# Patient Record
Sex: Male | Born: 1935 | Race: White | Hispanic: No | Marital: Married | Smoking: Former smoker
Health system: Southern US, Community
[De-identification: ages and names within clinical notes are randomized; demographics above are authoritative.]

## PROBLEM LIST (undated history)

## (undated) DIAGNOSIS — J449 Chronic obstructive pulmonary disease, unspecified: Secondary | ICD-10-CM

## (undated) DIAGNOSIS — I6521 Occlusion and stenosis of right carotid artery: Secondary | ICD-10-CM

## (undated) DIAGNOSIS — K2970 Gastritis, unspecified, without bleeding: Secondary | ICD-10-CM

## (undated) DIAGNOSIS — C801 Malignant (primary) neoplasm, unspecified: Secondary | ICD-10-CM

## (undated) DIAGNOSIS — K449 Diaphragmatic hernia without obstruction or gangrene: Secondary | ICD-10-CM

## (undated) DIAGNOSIS — K56609 Unspecified intestinal obstruction, unspecified as to partial versus complete obstruction: Secondary | ICD-10-CM

## (undated) DIAGNOSIS — E039 Hypothyroidism, unspecified: Secondary | ICD-10-CM

## (undated) DIAGNOSIS — I714 Abdominal aortic aneurysm, without rupture, unspecified: Secondary | ICD-10-CM

## (undated) DIAGNOSIS — I2510 Atherosclerotic heart disease of native coronary artery without angina pectoris: Secondary | ICD-10-CM

## (undated) DIAGNOSIS — R634 Abnormal weight loss: Secondary | ICD-10-CM

## (undated) HISTORY — PX: HERNIA REPAIR: SHX51

## (undated) HISTORY — DX: Malignant (primary) neoplasm, unspecified: C80.1

## (undated) HISTORY — PX: ABDOMINAL AORTIC ANEURYSM REPAIR: SUR1152

## (undated) HISTORY — DX: Gastritis, unspecified, without bleeding: K29.70

## (undated) HISTORY — DX: Diaphragmatic hernia without obstruction or gangrene: K44.9

## (undated) HISTORY — DX: Abnormal weight loss: R63.4

## (undated) HISTORY — PX: CHOLECYSTECTOMY: SHX55

## (undated) HISTORY — DX: Unspecified intestinal obstruction, unspecified as to partial versus complete obstruction: K56.609

## (undated) HISTORY — DX: Chronic obstructive pulmonary disease, unspecified: J44.9

## (undated) HISTORY — DX: Hypothyroidism, unspecified: E03.9

## (undated) HISTORY — DX: Atherosclerotic heart disease of native coronary artery without angina pectoris: I25.10

## (undated) HISTORY — PX: BACK SURGERY: SHX140

## (undated) HISTORY — DX: Abdominal aortic aneurysm, without rupture, unspecified: I71.40

## (undated) HISTORY — PX: OTHER SURGICAL HISTORY: SHX169

## (undated) HISTORY — DX: Abdominal aortic aneurysm, without rupture: I71.4

## (undated) HISTORY — DX: Occlusion and stenosis of right carotid artery: I65.21

---

## 2004-07-12 ENCOUNTER — Emergency Department (HOSPITAL_COMMUNITY): Admission: EM | Admit: 2004-07-12 | Discharge: 2004-07-12 | Payer: Self-pay | Admitting: Emergency Medicine

## 2005-01-29 ENCOUNTER — Emergency Department (HOSPITAL_COMMUNITY): Admission: EM | Admit: 2005-01-29 | Discharge: 2005-01-29 | Payer: Self-pay | Admitting: Emergency Medicine

## 2005-02-17 ENCOUNTER — Ambulatory Visit: Payer: Self-pay | Admitting: Internal Medicine

## 2005-02-19 ENCOUNTER — Ambulatory Visit: Payer: Self-pay | Admitting: Internal Medicine

## 2005-02-19 ENCOUNTER — Ambulatory Visit (HOSPITAL_COMMUNITY): Admission: RE | Admit: 2005-02-19 | Discharge: 2005-02-19 | Payer: Self-pay | Admitting: Internal Medicine

## 2005-02-19 HISTORY — PX: UPPER GASTROINTESTINAL ENDOSCOPY: SHX188

## 2005-03-19 ENCOUNTER — Ambulatory Visit: Payer: Self-pay | Admitting: Internal Medicine

## 2006-01-13 ENCOUNTER — Ambulatory Visit (HOSPITAL_COMMUNITY): Admission: RE | Admit: 2006-01-13 | Discharge: 2006-01-13 | Payer: Self-pay | Admitting: Orthopaedic Surgery

## 2006-01-13 ENCOUNTER — Encounter: Payer: Self-pay | Admitting: Orthopedic Surgery

## 2006-03-30 ENCOUNTER — Ambulatory Visit: Payer: Self-pay | Admitting: Internal Medicine

## 2006-03-31 ENCOUNTER — Ambulatory Visit (HOSPITAL_COMMUNITY): Admission: RE | Admit: 2006-03-31 | Discharge: 2006-03-31 | Payer: Self-pay | Admitting: Internal Medicine

## 2006-04-29 ENCOUNTER — Encounter: Admission: RE | Admit: 2006-04-29 | Discharge: 2006-04-29 | Payer: Self-pay | Admitting: Vascular Surgery

## 2006-05-03 HISTORY — PX: CARDIOVASCULAR STRESS TEST: SHX262

## 2006-06-01 ENCOUNTER — Inpatient Hospital Stay (HOSPITAL_COMMUNITY): Admission: RE | Admit: 2006-06-01 | Discharge: 2006-06-16 | Payer: Self-pay | Admitting: Vascular Surgery

## 2006-06-01 ENCOUNTER — Encounter (INDEPENDENT_AMBULATORY_CARE_PROVIDER_SITE_OTHER): Payer: Self-pay | Admitting: *Deleted

## 2006-07-12 ENCOUNTER — Inpatient Hospital Stay (HOSPITAL_COMMUNITY): Admission: EM | Admit: 2006-07-12 | Discharge: 2006-07-19 | Payer: Self-pay | Admitting: Emergency Medicine

## 2006-07-13 ENCOUNTER — Encounter (INDEPENDENT_AMBULATORY_CARE_PROVIDER_SITE_OTHER): Payer: Self-pay | Admitting: *Deleted

## 2006-07-13 HISTORY — PX: UPPER GASTROINTESTINAL ENDOSCOPY: SHX188

## 2006-09-19 ENCOUNTER — Ambulatory Visit (HOSPITAL_COMMUNITY): Admission: RE | Admit: 2006-09-19 | Discharge: 2006-09-19 | Payer: Self-pay | Admitting: Family Medicine

## 2006-10-28 ENCOUNTER — Ambulatory Visit: Payer: Self-pay | Admitting: Vascular Surgery

## 2007-06-07 ENCOUNTER — Inpatient Hospital Stay (HOSPITAL_COMMUNITY): Admission: EM | Admit: 2007-06-07 | Discharge: 2007-06-09 | Payer: Self-pay | Admitting: Emergency Medicine

## 2007-06-08 HISTORY — PX: CARDIAC CATHETERIZATION: SHX172

## 2007-08-16 ENCOUNTER — Ambulatory Visit: Payer: Self-pay | Admitting: Vascular Surgery

## 2007-09-24 ENCOUNTER — Inpatient Hospital Stay (HOSPITAL_COMMUNITY): Admission: EM | Admit: 2007-09-24 | Discharge: 2007-09-25 | Payer: Self-pay | Admitting: Emergency Medicine

## 2007-10-22 ENCOUNTER — Inpatient Hospital Stay (HOSPITAL_COMMUNITY): Admission: EM | Admit: 2007-10-22 | Discharge: 2007-11-06 | Payer: Self-pay | Admitting: Emergency Medicine

## 2007-10-22 ENCOUNTER — Encounter: Payer: Self-pay | Admitting: Emergency Medicine

## 2007-11-21 ENCOUNTER — Encounter: Admission: RE | Admit: 2007-11-21 | Discharge: 2007-11-21 | Payer: Self-pay | Admitting: General Surgery

## 2009-01-30 ENCOUNTER — Ambulatory Visit: Payer: Self-pay | Admitting: Orthopedic Surgery

## 2009-01-30 DIAGNOSIS — M25569 Pain in unspecified knee: Secondary | ICD-10-CM | POA: Insufficient documentation

## 2009-01-30 DIAGNOSIS — M23329 Other meniscus derangements, posterior horn of medial meniscus, unspecified knee: Secondary | ICD-10-CM

## 2009-01-30 DIAGNOSIS — S83259A Bucket-handle tear of lateral meniscus, current injury, unspecified knee, initial encounter: Secondary | ICD-10-CM | POA: Insufficient documentation

## 2009-01-30 DIAGNOSIS — M1710 Unilateral primary osteoarthritis, unspecified knee: Secondary | ICD-10-CM

## 2009-01-30 DIAGNOSIS — M23302 Other meniscus derangements, unspecified lateral meniscus, unspecified knee: Secondary | ICD-10-CM

## 2009-01-31 ENCOUNTER — Encounter: Payer: Self-pay | Admitting: Orthopedic Surgery

## 2009-02-03 ENCOUNTER — Ambulatory Visit: Payer: Self-pay | Admitting: Orthopedic Surgery

## 2009-02-03 ENCOUNTER — Ambulatory Visit (HOSPITAL_COMMUNITY): Admission: RE | Admit: 2009-02-03 | Discharge: 2009-02-03 | Payer: Self-pay | Admitting: Orthopedic Surgery

## 2009-02-05 ENCOUNTER — Ambulatory Visit: Payer: Self-pay | Admitting: Orthopedic Surgery

## 2009-02-05 DIAGNOSIS — M1710 Unilateral primary osteoarthritis, unspecified knee: Secondary | ICD-10-CM

## 2009-02-06 ENCOUNTER — Encounter (HOSPITAL_COMMUNITY): Admission: RE | Admit: 2009-02-06 | Discharge: 2009-03-08 | Payer: Self-pay | Admitting: Orthopedic Surgery

## 2009-02-06 ENCOUNTER — Encounter: Payer: Self-pay | Admitting: Orthopedic Surgery

## 2009-02-19 ENCOUNTER — Encounter: Payer: Self-pay | Admitting: Orthopedic Surgery

## 2009-02-26 ENCOUNTER — Ambulatory Visit: Payer: Self-pay | Admitting: Orthopedic Surgery

## 2009-04-10 ENCOUNTER — Ambulatory Visit: Payer: Self-pay | Admitting: Orthopedic Surgery

## 2009-04-10 DIAGNOSIS — G5760 Lesion of plantar nerve, unspecified lower limb: Secondary | ICD-10-CM | POA: Insufficient documentation

## 2009-04-10 DIAGNOSIS — M25469 Effusion, unspecified knee: Secondary | ICD-10-CM | POA: Insufficient documentation

## 2009-04-24 ENCOUNTER — Ambulatory Visit: Payer: Self-pay | Admitting: Orthopedic Surgery

## 2009-04-29 ENCOUNTER — Ambulatory Visit: Payer: Self-pay | Admitting: Orthopedic Surgery

## 2009-05-07 ENCOUNTER — Ambulatory Visit: Payer: Self-pay | Admitting: Orthopedic Surgery

## 2009-05-21 ENCOUNTER — Ambulatory Visit: Payer: Self-pay | Admitting: Orthopedic Surgery

## 2009-06-04 ENCOUNTER — Ambulatory Visit: Payer: Self-pay | Admitting: Orthopedic Surgery

## 2009-08-12 ENCOUNTER — Ambulatory Visit: Payer: Self-pay | Admitting: Orthopedic Surgery

## 2009-12-24 ENCOUNTER — Ambulatory Visit: Payer: Self-pay | Admitting: Orthopedic Surgery

## 2010-07-08 ENCOUNTER — Ambulatory Visit: Payer: Self-pay | Admitting: Internal Medicine

## 2010-07-22 ENCOUNTER — Ambulatory Visit: Payer: Self-pay | Admitting: Internal Medicine

## 2010-07-22 ENCOUNTER — Ambulatory Visit (HOSPITAL_COMMUNITY): Admission: RE | Admit: 2010-07-22 | Discharge: 2010-07-22 | Payer: Self-pay | Admitting: Internal Medicine

## 2010-07-22 HISTORY — PX: COLONOSCOPY: SHX174

## 2010-07-22 HISTORY — PX: UPPER GASTROINTESTINAL ENDOSCOPY: SHX188

## 2010-08-12 ENCOUNTER — Ambulatory Visit: Payer: Self-pay | Admitting: Orthopedic Surgery

## 2010-09-29 NOTE — Assessment & Plan Note (Signed)
Summary: REQ DRAW FLUID LEFT KNEE/MEDICARE/BSF   Visit Type:  Follow-up Referring Provider:  self  CC:  left knee pain.  History of Present Illness: 77 years old male status post arthroscopy and partial lateral meniscectomy LEFT knee June of 2010 DOS 02-03-09. Procedure arthroscopy, partial and lateral menisectomies.  Medications: Tylenol and Vicodin.  His postoperative course has been marked by recurrent effusions and has required recurrent aspirations and injections and he had a lot of swelling last week and wanted to have his knee aspirated again.  However today the swelling is gone down he is just having some lateral knee pain.  Review of systems no mechanical symptoms    Allergies: 1)  ! Penicillin 2)  ! Niacin 3)  ! * Nexium 4)  ! Aspirin (Aspirin)  Past History:  Past Medical History: Last updated: 01/30/2009 copd thyroid reflux htn  Past Surgical History: Last updated: 01/30/2009 hernia repair x2 in the abdomen back ulcer gallbladder bowel obstruction aneurysm repair abdominal  Social History: Last updated: 01/30/2009 Patient is married.  yard work  Physical Exam  Additional Exam:  Is a very thin male who appears to be in good shape.  He is oriented x3.  His mood and affect are normal.  His gait and station are normal.  His LEFT knee has a mild amount of tenderness laterally there is no effusion.  His RIGHT knee is without joint effusion.  He has full range of motion in the LEFT knee.  His knee is stable.  His strength is normal.  His skin is intact.  He has an excellent pulse and normal sensation.     Impression & Recommendations:  Problem # 1:  KNEE PAIN (ZOX-096.04) Assessment Deteriorated  Verbal consent was obtained. The knee was prepped with alcohol and ethyl chloride. 1 cc of depomedrol 40mg /cc and 4 cc of lidocaine 1% was injected. there were no complications. Her LEFT knee was injected no aspiration  Orders: Joint Aspirate /  Injection, Large (20610)  Problem # 2:  OSTEOARTHRITIS, LOWER LEG (ICD-715.16)  Orders: Est. Patient Level III (54098) Depo- Medrol 40mg  (J1030) Joint Aspirate / Injection, Large (20610)  Patient Instructions: 1)  You have received an injection of cortisone today. You may experience increased pain at the injection site. Apply ice pack to the area for 20 minutes every 2 hours and take 2 xtra strength tylenol every 8 hours. This increased pain will usually resolve in 24 hours. The injection will take effect in 3-10 days.  2)  Please schedule a follow-up appointment as needed.

## 2010-10-01 NOTE — Assessment & Plan Note (Signed)
Summary: bi knee pain may need new xr/mcr/bsf   Visit Type:  Follow-up Referring Provider:  self  CC:  knee pain.  History of Present Illness: I saw Luis Guerra in the office today for a followup visit.  He is a 77 years old man with the complaint of:  knee pain  Last injection 4/11.  He states that his knees have been bothering him for 2 months.  Medication: Tylenol.  He really complains of bilateral knee stiffness, mild pain.  He has excellent range of motion of both knees. No joint effusions. Medial tenderness along the joint line  Bilateral knee injections Verbal consent was obtained. The RIGHT knee was prepped with alcohol and ethyl chloride. 1 cc of depomedrol 40mg /cc and 4 cc of lidocaine 1% was injected. there were no complications.  Verbal consent was obtained. The knee was prepped with alcohol and ethyl chloride. 1 cc of depomedrol 40mg /cc and 4 cc of lidocaine 1% was injected. there were no complications.   Allergies: 1)  ! Penicillin 2)  ! Niacin 3)  ! * Nexium 4)  ! Aspirin (Aspirin)   Impression & Recommendations:  Problem # 1:  DEGENERATIVE JOINT DISEASE, RIGHT KNEE (ICD-715.96) Assessment Deteriorated  Orders: Joint Aspirate / Injection, Large (20610) Depo- Medrol 40mg  (J1030)  Problem # 2:  OSTEOARTHRITIS, KNEE, LEFT (ICD-715.96) Assessment: Deteriorated  Orders: Joint Aspirate / Injection, Large (20610) Depo- Medrol 40mg  (J1030)  Patient Instructions: 1)  You have received an injection of cortisone today. You may experience increased pain at the injection site. Apply ice pack to the area for 20 minutes every 2 hours and take 2 xtra strength tylenol every 8 hours. This increased pain will usually resolve in 24 hours. The injection will take effect in 3-10 days.  2)  Please schedule a follow-up appointment as needed.   Orders Added: 1)  Joint Aspirate / Injection, Large [20610] 2)  Depo- Medrol 40mg  [J1030]

## 2010-11-10 LAB — KOH PREP

## 2010-12-07 LAB — BASIC METABOLIC PANEL
Calcium: 9 mg/dL (ref 8.4–10.5)
GFR calc Af Amer: >60 mL/min (ref >60)
GFR calc non Af Amer: 56 mL/min — ABNORMAL LOW (ref >60)
Sodium: 140 mEq/L (ref 135–145)

## 2010-12-07 LAB — HEMOGLOBIN AND HEMATOCRIT, BLOOD: HCT: 39 % (ref 39.0–52.0)

## 2011-01-12 NOTE — H&P (Signed)
NAME:  Luis Guerra, Luis Guerra NO.:  [id number]   MEDICAL RECORD NO.:  [id number]          PATIENT TYPE:  INP   LOCATION:  5121                         FACILITY:  [facility]   PHYSICIAN:  Adolph Pollack, M.D.DATE OF BIRTH:  03-Nov-1920   DATE OF ADMISSION:  10/22/2007  DATE OF DISCHARGE:                              HISTORY & PHYSICAL   REASON FOR ADMISSION:  Partial small bowel obstruction.   HISTORY OF PRESENT ILLNESS:  This is a 77 year old male who has been  having intermittent episodes of abdominal bloating and nausea and  vomiting.  No pain with this.  He does have bowel movements, had a bowel  movement yesterday.  However, he had bilious vomiting today and  presented to [facility] emergency department.  He was evaluated there,  noted to be severely dehydrated with a BUN in the 70s and a creatinine  of 4.75.  He had a CT scan which suggested a possible proximal small  bowel obstruction.  They called the general surgeon there, but since he  had had previous surgeries here, it was recommended he be transferred to  [facility], and he was brought down here for evaluation.  He states that  his problems with this started after he had an abdominal aortic aneurysm  repair and a ventral hernia with mesh in October of 2007.  The episodes  are becoming more frequent.  He was last admitted to [facility]  for an episode like this and severe dehydration January 24 of this year.   PAST MEDICAL HISTORY:  1. Hypertension.  2. Abdominal aortic aneurysm.  3. Peptic ulcer disease.  4. COPD.  5. Hypothyroidism.  6. Coronary artery disease.  7. Gastroesophageal flux disease.  8. Severe dehydration, acute renal insufficiency.   PREVIOUS OPERATIONS:  1. Partial gastrectomy.  2. Abdominal aneurysm repair.  3. Ventral hernia with mesh.   ALLERGIES:  NIASPAN AND PENICILLIN.   MEDICATIONS:  L-thyroxine, Protonix, Advair discus, theophylline.   SOCIAL HISTORY:  He  is a  former smoker.  He ate raisins that have been  soaked in gin every day.  He is married, and his wife is here with him.   REVIEW OF SYSTEMS:  GENERAL:  Notable for the weight loss as above.  CARDIOVASCULAR:  He has apparently had an episode and stable angina in  the past but has been treated medically.  Denies chest pain now.  PULMONARY:  Has the skin COPD as above.  GI:  He denies any hepatitis, diverticulosis, blood in stool.  GU:  He does have nocturia and BPH-type symptoms but does not start to  strain.  ENDOCRINE:  No diabetes or hypercholesterolemia, per his report.  NEUROLOGIC:  No strokes or seizures.  HEMATOLOGIC:  No known bleeding disorders, blood clots, or transfusions.   PHYSICAL EXAM:  GENERAL:  A thin elderly male, no acute distress.  VITAL SIGNS:  Temperature is 96.7, blood pressure is 165/98, pulse 95.  HEENT:  Notable for bilateral hearing aids and an NGN.  Tongue is  slightly moist.  NECK:  Supple, no obvious masses, and  muscular wasting is noted.  RESPIRATORY:  The breath sounds are distant but equal and clear.  CARDIOVASCULAR:  Regular rate, regular rhythm.  ABDOMEN:  Soft, nontender.  There is a midline incision with very  irregular foreign body palpable, consistent with mesh.  Few bowel sounds  are noted.  No high-pitched bowel sounds.  No significant distention or  tympany.  RECTAL:  He has a stage I sacral decubitus ulcer.  There is normal  rectal tone.  Smooth prostate with enlargement.  Stool was Hemoccult  negative.  MUSCULOSKELETAL:  He has muscular wasting noted.  NEUROLOGIC:  He is alert and oriented, answers questions appropriately,  has good motor strength.   LABORATORY DATA:  Is notable for sodium 126, potassium 4.9, bicarb 16,  BUN 73, creatinine 4.75, glucose 152, hemoglobin 17.4, white 22,300.   CT scan was reviewed.  There is proximal small bowel and gastric  dilatation, with a transition point near the anterior abdominal wall;  however, there  is distal colonic air present as well.   IMPRESSION:  Recurrent partial small-bowel obstruction-type symptoms.  He has severe a dehydration with this and currently acute renal  insufficiency.  Also has a stage I decubitus ulcer, hyponatremia, COPD  and history of coronary artery disease.   PLAN:  Will admit to the hospital and aggressively hydrate him.  We will  do an NG tube decompression.  Given the fact that he is having recurrent  episodes, he may eventually require exploratory laparotomy for this.  This has been discussed with him and the family.      Adolph Pollack, M.D.  Electronically Signed     TJR/MEDQ  D:  10/22/2007  T:  10/23/2007  Job:  [id number]

## 2011-01-12 NOTE — Cardiovascular Report (Signed)
NAME:  Luis Guerra, Luis Guerra NO.:  [id number]   MEDICAL RECORD NO.:  [id number]          PATIENT TYPE:  INP   LOCATION:  4715                         FACILITY:  [facility]   PHYSICIAN:  Peter M. Swaziland, M.D.  DATE OF BIRTH:  Mar 08, 1921   DATE OF PROCEDURE:  06/08/2007  DATE OF DISCHARGE:                            CARDIAC CATHETERIZATION   INDICATIONS FOR PROCEDURE:  The patient is a 77 year old, white male  with known history of vascular disease.  He has a documented right  carotid occlusion and he has had prior abdominal aortic aneurysm repair.  He presents with symptoms of unstable angina.   PROCEDURES:  Left heart catheterization, coronary and left ventricular  angiography.   EQUIPMENT:  6-French, 4 cm right and left Judkins catheter; 6-French  pigtail catheter; 6-French arterial sheath.  All catheter exchanges were  performed over a long exchange wire.   ACCESS:  Via right femoral artery using standard Seldinger technique.   MEDICATIONS:  Local anesthesia with 1% Xylocaine, Versed 1 mg IV.  Contrast 130 mL of Omnipaque.   HEMODYNAMIC DATA:  Aortic pressures 145/69 with a mean of 103.  Left  ventricle pressure is 149 with an EDP of 8 mmHg.   ANGIOGRAPHIC DATA:  The left coronary arises and distributes normally.  The left main coronary has mild irregularities less than or equal to  10%.   The left anterior descending artery has diffuse disease up to 40-50% in  the midvessel.  The first diagonal has a 60-70% stenosis at its takeoff.  The second diagonal has a flush occlusion at its origin.   The left circumflex coronary has scattered irregularities less than or  equal to 20-30%.   The right coronary artery is a dominant vessel.  It also has scattered  irregularities diffusely up to 20%.   Left ventricular angiography was performed in the RAO view.  This  demonstrates normal left ventricular size and systolic function.  Overall ejection fraction is estimated at  55%.   INTERPRETATION:  1. Single-vessel obstructive atherosclerotic coronary disease with      occlusion of the second diagonal branch.      There are no collaterals to this vessel.  2. Good left ventricular systolic function.   PLAN:  Would recommend medical therapy and risk factor modification.           ______________________________  Peter M. Swaziland, M.D.     PMJ/MEDQ  D:  06/08/2007  T:  06/09/2007  Job:  [id number]   cc:   [facility] E. Darrick Penna, MD

## 2011-01-12 NOTE — Op Note (Signed)
NAME:  ROYDEN, BULMAN NO.:  [id number]   MEDICAL RECORD NO.:  [id number]          PATIENT TYPE:  INP   LOCATION:  5156                         FACILITY:  [facility]   PHYSICIAN:  Sharlet Salina T. Hoxworth, M.D.DATE OF BIRTH:  1921-03-02   DATE OF PROCEDURE:  10/31/2007  DATE OF DISCHARGE:                               OPERATIVE REPORT   PREOPERATIVE DIAGNOSIS:  Recurrent small-bowel obstruction secondary to  adhesions.   POSTOPERATIVE DIAGNOSIS:  Recurrent small-bowel obstruction secondary to  adhesions.   SURGICAL PROCEDURES:  Laparoscopic lysis of adhesions for small-bowel  obstruction.   SURGEON:  Lorne Skeens. Hoxworth, M.D.   ANESTHESIA:  General.   BRIEF HISTORY:  Mr. Lazcano is an 77 year old male with a remote history  of partial gastrectomy.  He then underwent aortic aneurysm repair in  2007.  He had a known ventral hernia, and CT scan also questioned an  internal bowel hernia, although he was not having any GI problems at  that point.  At the time of his abdominal aortic aneurysm repair, I  explored his bowel and did not find any internal hernias or other  abnormalities and repaired his ventral hernia with a large piece of  Kugel intra-abdominal mesh.  He has had increasingly frequent episodes  of small-bowel obstruction.  These have all resolved with nonsurgical  treatment, but particularly in the last year he has had multiple  admissions with dehydration, small-bowel obstructions that quickly  resolves.  He has lost about 50 pounds.  He was admitted on October 22, 2007 with another episode of nausea and vomiting, and CT scan showed a  proximal small-bowel obstruction with an apparent transition point  against the anterior abdominal wall with an adherent loop of bowel  against the mesh.  This, again, is improved with NG suction, IV fluids,  and TNA.  However, due to repeated episodes as above, we elected to  proceed with laparoscopic and possible lysis of  adhesions.  The nature  of the procedure, its indications, risks of bleeding, infection,  intestinal injury, possible removal of mesh, and failure to relieve his  symptoms have been discussed and understood.  He is now brought to the  operating room for this procedure.   DESCRIPTION OF OPERATION:  The patient brought to the operating room and  placed in the supine position on the operating table, and general  endotracheal anesthesia was induced.  PAS were placed.  He received  preoperative IV antibiotics.  The abdomen was widely sterilely prepped  and draped.   A 1-cm incision was made in the left lateral midabdomen and dissection  carried down to the anterior fascia which was incised for 1 cm.  The  muscle layers were bluntly split, the peritoneum elevated and incised  under direct vision and opened for 1 cm into free peritoneal cavity.  Through a mattress suture of 0 Vicryl, the Hasson trocar was placed and  pneumoperitoneum established.  Laparoscopy revealed several areas of  adhesion of small bowel up to the mesh, but there was certainly working  room and good visibility, and I elected to  proceed with laparoscopic  approach.  Two 5-mm trocars were placed under direct vision and later  for positioning of instruments.  A third 5-mm trocar was placed all  along the left lateral abdomen.  Extensive adhesiolysis was then  performed from the anterior abdominal wall.  The CT scan had appeared to  indicate an area in the upper abdomen at the superior border of the  mesh.  There was an area of dense small-bowel adhesion this area at the  edge of the mesh where it had folded back a little bit, and this was all  carefully taken down with sharp dissection.  Visualization was  excellent.  Dissection was somewhat tedious but progressed without  difficulty, and I did not have any impression that there was any injury  to the small bowel.  After this area was taken down, there were two  further  areas, one along the midportion of the mesh with loops of small  bowel and then another at the inferior portion.  These were all taken  down, again carefully under direct vision with no sign of any bowel  injury, and the entire anterior abdominal wall was completely cleared.  There were some adhesions of the right colon up to the far right lateral  abdominal wall, but this clearly did not involve small bowel, and these  were left alone.  I identified the ileocecal valve, and the bowel was  traced proximally.  There was actually some mildly to moderately dilated  mid ileum within the bowel that gradually tapered back down to normal  caliber.  There were no adhesions as we worked proximally.  I passed  two more distal areas of small bowel that been adherent to the anterior  abdominal wall.  These were carefully inspected for bowel injury and  none seen.  There was no evidence of obstruction here.  I continued to  work proximally and came to the loop in the right upper quadrant which  had been adherent to the mesh superiorly and probably represented the  area seen on CT scan.  These adhesions had been completely taken down.  I suspect this was the area of the patients jejunojejunostomy  previously, as there were some adhesions up under the liver and more  posteriorly.  These, however, were clearly related to his previous  gastric surgery and not related to the mesh placement, as the small  bowel obstruction was only apparent after placement of the mesh and  there appeared to be a transition point at the mesh.  I did not feel  that trying to further mobilize these posterior adhesions or adhesions  to the liver were necessary, and I would not have been able to do this  laparoscopically.  Following this area around as it curved up on the  liver smoothly with no evidence obstruction, I did see a small bowel  limb now going up retrocolic toward the patient's gastric remnant.  The  abdomen was then  thoroughly irrigated and bowel inspected one more time  for evidence of injury and none seen.  There was no bleeding.  Trocars  were removed under direct vision and all CO2 evacuated.  The mattress  suture was secured at the [place] site.  The skin incisions were closed  with subcuticular Monocryl and Dermabond.  Sponge and needle counts  correct.  The patient taken to recovery in stable condition.      Lorne Skeens. Hoxworth, M.D.  Electronically Signed     BTH/MEDQ  D:  10/31/2007  T:  10/31/2007  Job:  [id number]

## 2011-01-12 NOTE — Consult Note (Signed)
NAME:  Luis Guerra, WAINRIGHT NO.:  [id number]   MEDICAL RECORD NO.:  [id number]          PATIENT TYPE:  EMS   LOCATION:  ED                            FACILITY:  [facility]   PHYSICIAN:  Osvaldo Shipper, MD     DATE OF BIRTH:  1921/04/26   DATE OF CONSULTATION:  06/07/2007  DATE OF DISCHARGE:                                 CONSULTATION   PRIMARY MEDICAL DOCTOR:  [facility]   REQUESTING PHYSICIAN:  Dr. Colon Branch from the emergency department.   REASON FOR CONSULTATION:  Chest pain.   CHIEF COMPLAINT:  Chest pain.   HISTORY OF PRESENT ILLNESS:  Patient is an 77 year old African-American  male who has a history of hypothyroidism, COPD, GERD with peptic ulcer  disease in the past, who is status post AAA repair in October, 2007, who  was in his usual state of health until about yesterday afternoon when he  started experiencing pressure-like sensation in the left side of his  chest radiating to his left shoulder.  The pain was waxing and waning.  It was not associated with any activity.  There was some nausea with one  episode of emesis.  No diaphoresis.  Mild shortness of breath is  present.  No dizziness or visual disturbances are present.  He took two  Tylenol yesterday and had some relief of the pain.  This morning when he  woke up again, he experienced a lot of pain, 7/10 in intensity, and he  then decided to come into the hospital.  He was given three sublingual  nitroglycerin which decreased his pain, and he was put on a nitro drip.  As a result, his pain is now down to less than 1/10 in intensity.   HOME MEDICATIONS:  1. Advair discus.  2. Levothyroxine 125 mcg once daily.  3. Protonix 40 mg once daily.  4. Theophylline 10 mg once daily.   ALLERGIES:  ASPIRIN, which causes GI intolerance.  Specifically, he has  been told this can cause peptic ulcer disease, but he has not had any  kind of anaphylactic reaction to aspirin.  PENICILLIN causes nausea  and  vomiting.   PAST MEDICAL HISTORY:  1. Hyperthyroidism.  2. GERD.  3. Peptic ulcer disease.  4. Abdominal aortic aneurysm repair last year.  5. Cataract surgery.  6. He has had surgery on his stomach for peptic ulcer disease.  7. Cholecystectomy.  8. Back surgery.  9. Left shoulder surgery.   He apparently had a stress test one year ago, done by Dr. Peter Swaziland  for his preop evaluation prior to his aneurysm repair, and he was told  that the stress test was within normal limits.   SOCIAL HISTORY:  He lives in [place] with his wife.  He quit  smoking 34 years ago.  No alcohol use.  No illicit drug use.   FAMILY HISTORY:  Noncontributory.   REVIEW OF SYSTEMS:  GENERAL:  Positive for weakness.  HEENT:  Unremarkable.  CARDIOVASCULAR:  As in HPI.  RESPIRATORY:  As in HPI.  GI:  As in HPI.  GU:  Unremarkable.  NEUROLOGIC:  Unremarkable.  DERMATOLOGIC:  Unremarkable.   PHYSICAL EXAMINATION:  VITAL SIGNS:  Temperature 97.8, blood pressure  initially when he came was 163/87, heart rate 83, respiratory rate 20,  saturation 99% on room air.  HEENT:  There is no pallor or icterus.  Oral mucous membranes are moist.  No lesions are noted.  NECK:  Soft and supple.  No thyromegaly is appreciated.  LUNGS:  Clear to auscultation bilaterally.  No wheezing, rales, or  rhonchi.  CARDIOVASCULAR:  S1 and S2 is normal.  Regular.  There is a systolic  murmur at the apex.  None at the aortic area.  No S3 or S4.  No bruits  are appreciated.  ABDOMEN:  Soft, nontender, nondistended.  Bowel sounds are present.  No  mass or organomegaly is appreciated.  EXTREMITIES:  Without edema.  Peripheral pulses are palpable.   LAB DATA:  CBC is unremarkable.  BMET shows a potassium of 3.4,  otherwise unremarkable.  Two sets of cardiac markers have been negative.   Chest x-ray did not show any acute findings.   His EKG, he has two here.  The first EKG done at 1500 hours showed  possible early  repolarization in lead II, V3, although there is  suspicion that there could be some kind of ST abnormality as well.  This  was done when he presented to the ED with chest pain.  He also has  evidence for left axis deviation.  He has sinus rhythm.  He does not  appear to have any Q waves.  The EKG intervals all appear to be in the  normal range.   A repeat EKG done 2-1/2 hours later showed that now there is some  evidence for some T wave inversion in III, aVF, as well as some in II as  well.  There is also T wave inversion in III, aVF, as well as some in II  as well.  There is also T inversion in V5-6.  The repolarization  findings in V2-3 are unchanged.   ASSESSMENT:  This is an 77 year old white male with gastroesophageal  reflux disease, hypothyroidism, chronic obstructive pulmonary disease,  who is otherwise in a fairly healthy state, presenting with chest pain.  His cardiac enzymes are negative, but his EKG is showing some dynamic  changes here.  This could be unstable angina.  He has been having off  and on pain since yesterday.  The patient is at high risk for having an  acute coronary syndrome.  I discussed this with Dr. Patty Sermons, who is  with [facility] at [facility], who agreed with this  assessment and has accepted the patient in transfer to [facility].  The patient will likely need to have a cardiac cath within  the next few days.  In the meantime, we will give him aspirin.  I told  the patient that his allergy to aspirin is more of an intolerance.  He  is on Protonix; hence, one dose of aspirin should not really cause him  any trouble; hence, chewable aspirin will be given.  We will also give  him some beta blockers to get his heart rate down to the 60s.  He is  already on a heparin infusion and a nitroglycerin infusion.  His other  medical problems will also  need to be addressed while he is at [facility].  This patient will be  transferred to [facility] within  the next hour or so.  All of the above  has been communicated to the patient as well as his family.  All of  their questions were answered to their satisfaction.      Osvaldo Shipper, MD  Electronically Signed     GK/MEDQ  D:  06/07/2007  T:  06/07/2007  Job:  [id number]   cc:   Cassell Clement, M.D.  Fax: [phone]

## 2011-01-12 NOTE — Discharge Summary (Signed)
NAME:  YU, PEGGS NO.:  [id number]   MEDICAL RECORD NO.:  [id number]          PATIENT TYPE:  INP   LOCATION:  4715                         FACILITY:  [facility]   PHYSICIAN:  Peter M. Swaziland, M.D.  DATE OF BIRTH:  03/27/1921   DATE OF ADMISSION:  06/07/2007  DATE OF DISCHARGE:  06/09/2007                               DISCHARGE SUMMARY   HISTORY OF PRESENT ILLNESS:  Luis Guerra is a 77 year old white male who  has a known history of vascular disease.  He was admitted with symptoms  of chest pain and fluctuating ECG changes.  He has no known history of  coronary disease.  He did have an adenosine Cardiolite a study year ago,  prior to abdominal aortic aneurysm repair, and this was normal.  He has  had prior abdominal aortic aneurysm repair.  He has also had prior TIA  with a documented right carotid artery occlusion.  He has had no  exertional chest pain.  The day of admission, the patient presented with  refractory chest pressure.  It was relieved with sublingual  nitroglycerin, morphine.  ECG showed fluctuating ST-T wave abnormality  laterally, but enzymes were unremarkable.  The patient was admitted for  further evaluation.  The patient has a remote history of smoking in  1959, does have a history of COPD and hypothyroidism.  He has had prior  history of gastritis and hiatal hernia. For details of his past medical  history, social history, family history and physical exam, please see  admission history and physical.   LABORATORY DATA:  Chest x-ray showed no acute abnormalities.  ECG showed  normal sinus rhythm with LVH.  There were ST and T-wave depressions in  leads 2, 3, aVF, V5 and V6, consistent with inferolateral ischemia.  These ST segment changes subsequently improved with treatment.   ADDITIONAL LABORATORY DATA:  TSH was normal at 0.95.  BNP level was 87.  Hemoglobin was 13.5, hematocrit 40.9, platelets 244,000.  White count  was 8,800.  Sodium was 140,  potassium 3.4, chloride 107, CO2 27, BUN 14,  creatinine 1.07, glucose of 116.  Coags were normal.  Liver function  studies were normal.  Albumin was 3.0.  Serial cardiac enzymes including  CPK-MB and troponins were negative x4.  Total cholesterol was 80 with an  LDL of 38, HDL 34, and triglycerides of 42.   HOSPITAL COURSE:  The patient was admitted to telemetry monitoring.  He  was placed on IV nitroglycerin, IV heparin.  He was given aspirin.  He  was started on low-dose beta blockade.  He had resolution of his chest  pain, with no further symptoms during his hospitalization.  On June 08, 2007, he underwent cardiac catheterization.  This demonstrated  approximately 40% disease in the mid-LAD.  The first diagonal had a 60%  to 70% lesion at his takeoff.  The second diagonal was occluded.  There  was diffuse non-obstructive disease in the circumflex and right coronary  distribution.  Left ventricular function appeared normal, with ejection  fraction of 55%.  Based on these findings,  it was felt that his symptoms  were related to the diagonal occlusion.  It was recommended that he be  treated medically.  He had no subsequent chest pain during his hospital  stay.  Based on his lipid parameters, we recommended initiation of  Niaspan 500 mg q.h.s.  The patient was discharged home in stable  condition on June 09, 2007.   DISCHARGE DIAGNOSES:  1. Unstable angina, secondary to second diagonal occlusion.  2. Low HDL cholesterol.  3. Status post abdominal aortic aneurysm repair.  4. History of right carotid occlusion.  5. Hypothyroidism.  6. Chronic obstructive pulmonary disease.  7. History of gastritis.   DISCHARGE MEDICATIONS:  1. Synthroid 150 mcg per day.  2. Remeron 15 mg q.h.s.  3. Theophylline 300 mg b.i.d.  4. Advair 250/50 1 puff b.i.d.  5. Plavix 75 mg daily.  6. Protonix 40 mg daily.  7. Prednisolone acetate ophthalmic solution 1% q.i.d. in the past.  8. Nepafenac  ophthalmic suspension 0.1% also q.i.d.  9. Coated baby aspirin 81 mg per day.  10.Toprol XL 25 mg per day.  11.Niaspan 500 mg q.h.s.  12.Nitroglycerin 0.4 mg sublingual p.r.n.   The patient was instructed on a heart-healthy diet.  He is instructed on  his progressive activity, but avoid heavy lifting for 1 week.  He will  follow up with Dr. Swaziland in 2 weeks.  His discharge status is improved.           ______________________________  Peter M. Swaziland, M.D.     PMJ/MEDQ  D:  06/09/2007  T:  06/09/2007  Job:  [id number]   cc:   Janetta Hora. Fields, MD  [facility] T. Hoxworth, M.D.

## 2011-01-12 NOTE — Group Therapy Note (Signed)
NAME:  Luis Guerra, Luis Guerra NO.:  [id number]   MEDICAL RECORD NO.:  [id number]          PATIENT TYPE:  OBV   LOCATION:  A302                          FACILITY:  [facility]   PHYSICIAN:  Dorris Singh, DO    DATE OF BIRTH:  1921-04-21   DATE OF PROCEDURE:  09/23/2007  DATE OF DISCHARGE:                                 PROGRESS NOTE   The patient is an 77 year old Caucasian male who presented to the [facility] Emergency Room with a history of an abnormal lab.  He was told that  his potassium was elevated; however, when he came here he was extremely  dehydrated.  He also reported having some nausea and vomiting for  several months and he was scheduled to have an endoscopy on Monday which  did not occur.  While he has been here the patient was able to eat but  became quite nauseated.  The patient is currently on IV fluids and  reports that he feels a little bit better.  Right now the patient denies  any shortness of breath or chest pain but is positive for nausea but no  vomiting.   PHYSICAL EXAMINATION:  He is pulse oxing at 96% on room air.  His vitals  are:  Temperature 97.8, pulse 82, respirations 18, and blood pressure  133/83.  GENERAL:  This is an 77 year old Caucasian who is well-developed, well-  nourished, no acute distress.  He is pleasant and answers questions  appropriately.  HEART:  Regular rate and rhythm.  LUNGS:  Upper lobes are clear.  Lower lobes have mild crackles.  ABDOMEN:  Soft, nontender, nondistended.  EXTREMITIES:  Positive pulses.  No ecchymosis, cyanosis, or edema.   LABORATORY DATA:  Labs for today include CBC:  White count of 13.3,  hemoglobin 15.6, hematocrit 47.7, platelet count of 330.  His  neutrophils are 82.  His sodium is 133, potassium is 5.6, chloride is  105, CO2 is 13, glucose 106, BUN 82, and creatinine is 3.55.   ASSESSMENT/PLAN:  1. Dehydration.  The patient currently has been on IV hydration, about      90 ml per hour.  He  states that he is feeling better; however, his      BUN has worsened, but his creatinine has slightly improved.  The      patient denies any history of having any problems with his kidneys      in the past.  Will go ahead and have Dr. Kennon Portela just give      recommendations if he thinks this patient needs to be followed as      an outpatient or if there is anything that needs to be changed.  2. Hyperkalemia.  Will go ahead and reorder a repeat BMET for tonight      and see if this is a hemolyzed sample and will determine if      anything needs to be done.  Also leukocytosis, which is persistent.      Will go ahead and get blood cultures x2 and get a urine culture and  sensitivity due to the fact that I heard some clinical changes      in his lungs.  Will go ahead and order a chest x-ray to rule out      any kind of pneumonia.  Even though the patient has reported having      some problems with nausea and vomiting, no assay of viral etiology      or an infectious etiology for that, will go ahead and continue to      follow this and make changes as necessary.      Dorris Singh, DO  Electronically Signed     CB/MEDQ  D:  09/23/2007  T:  09/24/2007  Job:  [id number]   cc:   Nicoletta Dress. Colon Branch, M.D.  Fax: [phone]

## 2011-01-12 NOTE — H&P (Signed)
NAME:  TEL, HEVIA NO.:  [id number]   MEDICAL RECORD NO.:  [id number]          PATIENT TYPE:  OBV   LOCATION:  A302                          FACILITY:  [facility]   PHYSICIAN:  Dorris Singh, DO    DATE OF BIRTH:  [birth year]   DATE OF ADMISSION:  09/22/2007  DATE OF DISCHARGE:  LH                              HISTORY & PHYSICAL   Patient is an 77 year old Caucasian male who presented to the emergency  room after being told that he had an abnormal lab.  They go to [facility], and they are told to go to the nearest emergency room  because his potassium was elevated.  When they got here, it was  rechecked, and it was found to be within normal limits.  However, it was  found that the patient was extremely dehydrated.  With this brings on a  story of the patient having nausea and vomiting for several months and  was getting evaluated by the GI office.  He was scheduled for an  endoscopy on Monday, which did not happen.   PAST MEDICAL HISTORY:  1. Aortic aneurysm.  2. GERD.  3. Hypertension.  4. COPD.  5. Hiatal hernia.   PAST SURGICAL HISTORY:  1. Aorta repair.  2. Back surgery.  3. Hiatal hernia repair.   SOCIAL HISTORY:  Nonsmoker, nondrinker.  No drug abuse.  Currently is  married and lives with his wife.   ALLERGIES:  PENICILLIN.   He is currently on Advair discus, unknown dose.  Levothyroxine 125 mcg  daily.  Protonix 40 mg once daily.  Theophylline 300 mg once daily.   REVIEW OF SYSTEMS:  CONSTITUTIONAL:  Negative for weakness, fever, and  chills.  EYES:  Negative for eye pain.  ENMT:  Positive for dysphagia.  Negative for ear pain.  CARDIOVASCULAR:  Negative for chest pain.  RESPIRATORY:  Negative for cough, dyspnea, or wheezing.  GASTROINTESTINAL:  Positive for nausea and vomiting.  Negative for  diarrhea or abdominal pain.  GU:  Negative for dysuria or flank pain.  MUSCULOSKELETAL:  Negative for arthralgias, back pain, neck pain.   SKIN:  Negative for rash.  NEURO:  Negative for headache.   PHYSICAL EXAMINATION:  VITALS:  Blood pressure 161/94, pulse rate 99,  respirations 20.  GENERAL:  This is an 77 year old Caucasian male who is well-developed  and well-nourished, answers questions appropriately.  HEENT:  Normocephalic and atraumatic.  PERRLA, EOMI.  TM's are  visualized bilaterally.  Turbinates are moist.  Gross auditory acuity is  intact.  NECK:  Supple.  No lymphadenopathy noted.  ABDOMEN:  Soft, nontender, nondistended.  Bowel sounds x4.  EXTREMITIES:  Positive pulses.  No ecchymosis, edema, or cyanosis noted.   LABS:  As follows, his chemistry:  Sodium 133, potassium 4.8, chloride  102, carbon dioxide 15, glucose 176, BUN 68, creatinine 3.66.   His acute abdominal series showed no acute cardiopulmonary process,  nonspecific gas pattern.   His white count is 14.1, hemoglobin 17.2, hematocrit 53.2, platelet  count 355.   ASSESSMENT:  1. Dehydration.  2. Leukocytosis.  PLAN:  Will admit patient to the service of [facility] for observation.  Will go ahead and give him IV fluids.  See how he progresses.  Will  check his labs in the morning.  Will determine if the patient can be  discharged in 1-2 days.      Dorris Singh, DO  Electronically Signed     CB/MEDQ  D:  09/23/2007  T:  09/23/2007  Job:  [id number]

## 2011-01-12 NOTE — Group Therapy Note (Signed)
NAME:  Luis Guerra, Luis Guerra NO.:  [id number]   MEDICAL RECORD NO.:  [id number]          PATIENT TYPE:  OBV   LOCATION:  A302                          FACILITY:  [facility]   PHYSICIAN:  Dorris Singh, DO    DATE OF BIRTH:  Jun 03, 1921   DATE OF PROCEDURE:  DATE OF DISCHARGE:                                 PROGRESS NOTE   SUBJECTIVE:  The patient is seen today doing fine.  He went for specific  testing, still awaiting his chest x-ray, which has not come up, but will  review that later on.  He says he feels pretty good.  We started him on  antibiotic therapy last night for questionably UTI, possibly; his white  count was elevated; however, today once we started initiating antibiotic  therapy, his white count has gone to normal.   OBJECTIVE:  VITALS FOR TODAY:  Temperature 97.5, pulse 80, respirations  16, blood pressure 130/62.  GENERAL:  The patient is an 77 year old  Caucasian male who is well developed, well nourished, in no acute  distress.  HEART:  Regular rate and rhythm.  LUNGS:  Clear to  auscultation bilaterally.  ABDOMEN:  Soft, nontender and non-distended.  EXTREMITIES:  Positive pulses.  No ecchymosis, edema or cyanosis.   His labs for this morning include:  A CBC with a white count of 9.1;  this has decreased from 13.3 from last night; hemoglobin of 13.6,  hematocrit of 41.3 and platelet count of 254,000.  BMET:  His sodium has  decreased to 130, which is low.  His potassium has come down, which is  4.1, chloride 109, CO2 13, glucose 90 and BUN 76, which is an  improvement from yesterday, and creatinine is 2.36, which is an  improvement as well.  His UA yesterday showed a few bacteria and his  blood cultures were negative.  Also, chest x-ray is done but is not  read, await further recommendations.   ASSESSMENT AND PLAN:  1. Dehydration.  2. Leukocytosis, etiology unknown.  3. Renal insufficiency.   PLAN:  1. I kept the patient on IV fluids; however, while  he was on IV      fluids, his renal function worsened.  I decided to go ahead and      have Dr. Kristian Covey to consult and participate; however, his renal      function is better today.  He may need to be followed as an      outpatient. I just want Dr. Kristian Covey to give a brief summarization      and evaluation of this patient.  2. Leukocytosis, which after starting the patient on antibiotic      therapy and obtaining blood cultures, has resolved.  The patient is      feeling clinically better.  Plan on discharge possibly tomorrow.  3. He is hyponatremic, but he is not experiencing any symptoms.  Once      he is hydrated, we will go ahead and address that as well.  We will      have him use salt liberally on his food  to see if that addresses      the problem and drinking any type of sodium-containing fluids.  4. Hyperkalemia.  His potassium, which was elevated and I think this      was a hemolyzed sample; that is within normal limits as well.  5. We will continue to monitor the patient and change therapy as      needed.      Dorris Singh, DO  Electronically Signed     CB/MEDQ  D:  09/24/2007  T:  09/24/2007  Job:  [id number]

## 2011-01-12 NOTE — H&P (Signed)
NAME:  Luis Guerra, Luis Guerra NO.:  [id number]   MEDICAL RECORD NO.:  [id number]          PATIENT TYPE:  INP   LOCATION:  4715                         FACILITY:  [facility]   PHYSICIAN:  Cassell Clement, M.D. DATE OF BIRTH:  May 25, 1921   DATE OF ADMISSION:  06/07/2007  DATE OF DISCHARGE:                              HISTORY & PHYSICAL   CHIEF COMPLAINT:  Chest pain.   HISTORY OF PRESENT ILLNESS:  This is an 77 year old married Caucasian  gentleman from [place] who is admitted in transfer from [facility] emergency room because of chest pressure and fluctuating  EKG changes and suspected unstable coronary syndrome.  The patient does  not have any history of known coronary artery disease.  He has had known  vascular disease.  In October 2007, he had successful aortic abdominal  aneurysm repair by Dr. Fabienne Bruns, and prior to that he did have a  preoperative adenosine Cardiolite at Dr. Cathren Laine office which  apparently was negative.  The patient does not give any history of  exertional chest pain.  Normally, he is able to work around the house  and do yard chores, and is able to walk a mile at a time without  difficulty.  Today, he began having chest pressure relieved by  sublingual nitroglycerin after arrival at [facility] and was  also given IV morphine.  His enzymes at [facility] were negative, but  EKG showed fluctuating ST-T wave abnormalities, and he was transferred  to [facility] for further evaluation.  Past medical history includes the fact  that in January 2008, he apparently was having some TIA symptoms and had  a carotid Doppler at [facility] and was found to have what appeared to  be a right total carotid occlusion.  He was put on Plavix, and has had  no further symptoms of TIAs.   FAMILY HISTORY:  Unremarkable.   SOCIAL HISTORY:  Reveals that he does not use alcohol, and he quit  smoking in 1959.  He retired from [employer] in  [place].   HOME MEDICATIONS:  1. Advair discus 250/50 one puff twice a day.  2. Levofloxacin 150 mcg daily.  3. Protonix 40 mg daily.  4. Theo-Dur 300 mg daily.  5. Plavix 75 mg daily.   PAST MEDICAL HISTORY:  1. Positive for COPD.  2. Hypothyroidism.  3. Hiatal hernia.   ALLERGIES:  PENICILLIN CAUSES SWELLING, ASPIRIN CAUSES DYSPEPSIA, BUT HE  IS NOT ACTUALLY ALLERGIC TO IT.   REVIEW OF SYSTEMS:  Reveals that he has had normal bowel movements.  He  has had no hematochezia or melena.  He denies any dysuria.  He has had  nocturia one to three times a night.  He denies cough or sputum  production.   Remainder of review of systems is negative in detail.   PHYSICAL EXAMINATION:  VITAL SIGNS:  Blood pressure is 128/78 on IV  nitro, pulse is 65 regular, respirations are normal.  HEENT:  Negative.  The patient is alert and cooperative.  He has a soft  bruit  over his left carotid, and he does not have any bruit over the  right carotid.  Jugular venous pressure normal.  Thyroid normal.  CHEST:  Clear.  HEART:  Reveals a soft grade 2/6 apical systolic murmur.  No diastolic  murmur, no gallop.  ABDOMEN:  Reveals a midline scar with palpable mesh, reflecting a  previous ventral hernia repair and previous AAA repair.  EXTREMITIES:  Show no edema or phlebitis.  He does have 1+ pedal pulses.   STUDIES:  His EKG shows normal sinus rhythm with inferolateral ST  depression noted on the EKG at 1745 today.  His initial labs are  unremarkable.  Chest x-ray shows no active disease.   IMPRESSION:  1. Chest pain, possible unstable angina.  2. Status post abdominal aortic aneurysm resection and ventral hernia      repair October 2007, by Dr. Darrick Penna and Dr. Johna Sheriff.  3. Past history of TIAs with history of right carotid occlusion.  4. Hypothyroidism.  5. COPD.  6. Hiatal hernia.   DISPOSITION:  He is being admitted to Dr. Cathren Laine service.  He  will be continued on the  IV nitro and IV heparin, that was begun in  [place].  Will continue Plavix as well.  Will get serial EKGs and  enzymes.  Will keep him n.p.o. after midnight tonight for probable  cardiac cath June 08, 2007 by Dr. Peter Swaziland.           ______________________________  Cassell Clement, M.D.     TB/MEDQ  D:  06/07/2007  T:  06/08/2007  Job:  [id number]   cc:   Peter M. Swaziland, M.D.  Janetta Hora. Darrick Penna, MD

## 2011-01-12 NOTE — Consult Note (Signed)
NAME:  Luis Guerra, Luis Guerra NO.:  [id number]   MEDICAL RECORD NO.:  [id number]          PATIENT TYPE:  INP   LOCATION:  5156                         FACILITY:  [facility]   PHYSICIAN:  Eduard Clos, MDDATE OF BIRTH:  Jan 12, 1921   DATE OF CONSULTATION:  DATE OF DISCHARGE:                                 CONSULTATION   REQUESTING PHYSICIAN:  Dr. Abbey Chatters   REASON FOR CONSULTATION:  Medical management of medical issues.   CHIEF COMPLAINT:  Nausea and vomiting.   HISTORY OF PRESENT ILLNESS:  An 77 year old male with known history of  abdominal aortic aneurysm repair, chronic obstructive pulmonary disease,  hypothyroidism was  admitted after the patient was having nausea and  vomiting. On admission, the patient had a CAT scan of the abdomen and  pelvis which showed distention of the stomach in the proximal small  bowel likely due to adhesions of the anterior abdominal wall where there  is mesh noted. There are no other significant abdominal findings.  The  patient was admitted to the medical floor for further management of the  partial small bowel obstruction. The patient stated he has been having  these symptoms recurrently over the last few months. The patient was  recently admitted three weeks ago in [facility] where he said he was  found to be in acute renal failure. He had  an IV bicarbonate drip and  he got better and was discharged, after which he subsequently started  developing again these symptoms with nausea and vomiting, not able to  keep anything in the stomach and he  in fact was vomiting bilious-color  vomit. He denies any blood in the vomitus. He denies any chest pain. He  denies any abdominal pain, shortness of breath, weakness of the limbs,  fever, chills, headache, dizziness, or loss of consciousness.   PAST MEDICAL HISTORY:  Hypothyroidism, abdominal aortic aneurysm repair,  chronic obstructive pulmonary disease.   PAST SURGICAL HISTORY:   Abdominal aortic aneurysm repair,  cholecystectomy, hernia repair in the stomach, and low back surgery.   MEDICATION PRIOR TO ADMISSION:  1. Advair Diskus 250/50one puff twice a day.  2. Theophylline 300 mg twice a day.  3. Protonix 40 mg once a day.  4. Levothyroxine 125 mcg p.o. daily.  5. Plavix 75 mg  p.o. daily.  6. Protonix 40 mg p.o. daily.  7. Mirtazapine 50 mg at bedtime.   Presently the patient is on IV fluids D5 half normal with KCl at 150 cc  per hour, IV Synthroid 50 mcg IV daily   ALLERGIES:  PENICILLIN  WHICH CAUSES SWELLING AND HIVES.   FAMILY HISTORY:  Noncontributory.   SOCIAL HISTORY:  The patient states he quit smoking in 1958. He denies  any alcohol or drug abuse.   REVIEW OF SYMPTOMS:  As previously stated in the History of Present  Illness, nothing else is of significance.   PHYSICAL EXAMINATION:  The patient was examined at bedside. No acute  distress.  VITAL SIGNS: Blood pressure 120/76. Pulse 49 per minute. Temperature  98.4. Respirations 18 per minute. O2 sat 96%.  Intake is 2646; output is  3275.  HEENT: Anicteric. No pallor.  CHEST:  Bilateral air sounds. No rhonchi or crepitations  HEART: S1, S2.  ABDOMEN: Soft. There is a foreign body palpable in the epigastric area.  Bowel sounds are very feeble. Abdomen is soft and no discolorations.  Scars from previous surgeries are seen.  CENTRAL NERVOUS SYSTEM: The patient is alert, awake, oriented to time,  place, and person.  EXTREMITIES: Both upper and lower extremities 5/5.  Symmetric pedal  pulses felt. No edema.   LABORATORY DATA:  CBC: WBC is 12.5, hemoglobin 40.7, hematocrit 44.3,  platelets 447. CMP: Sodium 131, potassium 3.3, chloride 103, carbon  dioxide 14, glucose 129, BUN 77, creatinine 4, calcium 8.2. CT scan of  the abdomen and pelvis shows distention of the stomach and proximal  small bowel likely secondary to adhesions of the anterior abdominal wall  where there is a mesh. X-ray of  the abdomen shows nasogastric tube  coiled in the gastric fundus. No plain film evidence of obstruction or  free air.   ASSESSMENT:  1. Acute renal failure secondary to dehydration secondary to partial      small bowel obstruction.  2. Partial small bowel obstruction.  3. Chronic obstructive pulmonary disease.  4. Hypothyroidism.   PLAN:  Will change fluid to d5w with bicarbonate  and follow  intake/output closely. Also get a renal ultrasound. If the patient  continues to be nauseated and not able to take anything orally mau need  parenteral  nutrition. Will follow the patient closely.   Thanks for involving [place] in the patient's care.      Eduard Clos, MD  Electronically Signed     ANK/MEDQ  D:  10/23/2007  T:  10/23/2007  Job:  [id number] [id number]

## 2011-01-12 NOTE — Discharge Summary (Signed)
NAME:  Luis Guerra, SANGHA NO.:  [id number]   MEDICAL RECORD NO.:  [id number]          PATIENT TYPE:  INP   LOCATION:  A302                          FACILITY:  [facility]   PHYSICIAN:  Dorris Singh, DO    DATE OF BIRTH:  [birth year]   DATE OF ADMISSION:  09/22/2007  DATE OF DISCHARGE:  01/26/2009LH                               DISCHARGE SUMMARY   INTERIM DISCHARGE SUMMARY:   PRIMARY CARE PHYSICIAN:  [facility].   CONSULTATIONS:  Jorja Loa, M.D.   TESTS:  That were done on him include:  1. Acute abdominal series with no acute cardiopulmonary process and no      acute bowel pattern.  2. Portable chest on the January 25th, which showed no acute chest      findings.  3. He had a renal ultrasound on the 26th which showed renal cortical      atrophy with borderline increased renal cortical echogenicity      bilaterally.  No renal mass or hydronephrosis identified.   HISTORY AND PHYSICAL:  Done by Dr. Dorris Singh but to summarize,  this is an 77 year old male who presented to [facility] Emergency Room  after being told by [facility] that he had an elevated  potassium.  When he was checked here in the ED,  it was found to be  normal.  However, he was found to be dehydrated and had mentioned that  he had several months of nausea and vomiting and was supposed to be  evaluated by the gastroenterologist where he lives.  At that point in  time, it was determined he would be admitted for dehydration.  While the  patient was here, his kidney function continued to deteriorate.  At that  point in time it was determined that we would go ahead and do a UA and  start the patient on antibiotics and would consult Dr. Kristian Covey.  The  patient was started on Levaquin also to make sure we were not dealing  with a source because he did have a leukocytosis.  We would go ahead and  get a chest x-ray to rule out any type of pneumonia.  After the  administration of IV antibiotics, the patient started to feel better.  His leukocytosis resolved.  However, his BUN and creatinine did not even  with aggressive hydration.  DrKristian Covey felt it necessary for him to go ahead and check his  kidneys to make sure that they were anatomically there was not any  issue.  He ordered a renal ultrasound to be done on the 26th.  Once it  was determined the renal ultrasound was negative, recommended the  patient follow up with him outpatient.  Since I saw the patient last  night, he was stable enough to go but decided not to do a discharge  summary in case Dr. Kristian Covey wanted to keep him.  Dr. Lilian Kapur did see  the patient at this point in time.  Per my order, the patient was okay  to be discharged to home and after consulting with Dr. Kristian Covey and  getting his recommendations Dr. Lilian Kapur did the discharge order.  He  was sent home.  He was in stable condition.   His medications are:  1. Advair Diskus inhaler daily.  2. Levothyroxine 125 mcg daily.  3. Protonix 40 mg daily.  4. Theophylline 300 mg daily.  5. Aspirin when needed.  6. He will be sent home on sodium bicarbonate 650 mg p.o. b.i.d.  7. Levaquin 500 mg p.o. daily.   Also, he will see Dr. Kristian Covey in 2 weeks and he has been given the  phone number and also to follow up with his PCP, [facility]  in 1 week.  He is supposed to be on a low sodium diet and increase  activity as tolerated.      Dorris Singh, DO  Electronically Signed     CB/MEDQ  D:  09/25/2007  T:  09/25/2007  Job:  [id number] [id number]

## 2011-01-12 NOTE — Op Note (Signed)
NAME:  Luis Guerra, Luis Guerra               ACCOUNT NO.:  [id number]   MEDICAL RECORD NO.:  [id number]          PATIENT TYPE:  AMB   LOCATION:  DAY                           FACILITY:  [facility]   PHYSICIAN:  Vickki Hearing, M.D.DATE OF BIRTH:  12/22/1920   DATE OF PROCEDURE:  DATE OF DISCHARGE:  02/03/2009                               OPERATIVE REPORT   PREOPERATIVE DIAGNOSES:  Osteoarthritis and medial and lateral meniscal  tears, left knee.   POSTOPERATIVE DIAGNOSES:  Osteoarthritis and medial and lateral meniscal  tears, left knee.   PROCEDURES:  Arthroscopy of left knee; partial medial and lateral  meniscectomy.   SURGEON:  Vickki Hearing, MD   ASSISTANTS:  None.   SPINAL:  Anesthetic.   OPERATIVE FINDINGS:  He had mild osteoarthritis; grade 1; lateral tibial  plateau, grade 2; patella, grade 1; medial compartment; posterior horn  medial meniscus undersurface tear; and a free edge anterior horn lateral  meniscus tear.   SPECIMENS:  None.   BLOOD LOSS:  None.   COUNTS:  Correct.   COMPLICATIONS:  None.   The patient to PACU in good condition.   HISTORY:  An 77 year old male left greater than right knee pain,  complained of sharp nonradiating joint pain associated with catching and  weakness of the left knee status post multiple injections self referred  to me after being treated by Dr. Hilda Lias.  He had an MRI in May 2007  showed medial lateral meniscal tears with arthritis in all 3  compartments.   PROCEDURE IN DETAILS:  Site marking, patient identification done preop  in holding area.  Vancomycin given due to PENICILLIN allergy.  The  patient was taken to surgery, spinal anesthetic, placed supine on the  operating table, left knee prepped and draped sterilely.  We used  chlorhexidine prep.  After dried, we did a time-out.   We made a lateral portal.  We introduced the scope laterally.  We did a  diagnostic arthroscopy.  We established a medial portal.  We  inserted a  probe, repeated the diagnostic arthroscopy and the findings are as  noted.   We used a straight duckbill forceps to resect the tear.  We used a  shaver to suck the torn fragments out of the joint.  We used ArthroCare  50-degree wand to contour the meniscus.  We reprepped the meniscus and  it was stable at its rim.   We then used the shaver to resect the torn portion of the lateral  meniscus anterior horn.  We then irrigated the knee, washed it with  fluid and removed any remaining meniscal fragments with a shaver.   We injected 30 mL of Marcaine at the end of the procedure and used Steri-  Strips to close the wounds.  We applied a Cryo/Cuff along with sterile  dressings and the patient was taken to recovery room in stable  condition.   POSTOPERATIVE PLAN:  He is full weightbearing with crutches as  tolerated.  He can start therapy on Thursday.  I will follow him on  Wednesday in the office for dressing  change.   DISCHARGE MEDICATIONS:  Vicodin half to one tab q.4 h. p.r.n. pain #40  with 1 refill.   Therapy orders have been written and he can start again on Thursday.      Vickki Hearing, M.D.  Electronically Signed     SEH/MEDQ  D:  02/03/2009  T:  02/04/2009  Job:  [id number]

## 2011-01-12 NOTE — Consult Note (Signed)
NAME:  Luis Guerra, Luis Guerra               ACCOUNT NO.:  [id number]   MEDICAL RECORD NO.:  [id number]          PATIENT TYPE:  OBV   LOCATION:  A302                          FACILITY:  [facility]   PHYSICIAN:  Jorja Loa, M.D.DATE OF BIRTH:  1921-03-31   DATE OF CONSULTATION:  09/24/2007  DATE OF DISCHARGE:                                 CONSULTATION   REASON FOR CONSULTATION:  Elevated BUN and creatinine.   REFERRING PHYSICIAN:  Dorris Singh, DO   Ms. Frieson is a 77 year old Caucasian male who has a past medical  history of hypertension, aortic aneurysm and COPD who presently came  here because of complaints of nausea, vomiting for a couple of days  duration.  When the patient was seen in the emergency room was found to  have hyperkalemia, elevated BUN and creatinine.  Hence, consultation was  called.  At this moment, the patient seems to be feeling better.  He  does not have any nausea, no vomiting, no shortness of breath,  dizziness, or lightheadedness.   PAST MEDICAL HISTORY:  The patient has history of hypertension.  1. History of COPD.  2. History of aortic aneurysm.  3. History of hernia.  4. History of peptic ulcer disease.  5. History of hypothyroidism.   PAST SURGICAL HISTORY:  1. History of cholecystectomy.  2. History of back surgery.  3. History of aortic aneurysm repair.   MEDICATIONS:  1. Lovenox 30 mg subcutaneously q. 24 h.  2. Levaquin 500 mg q. 24 h.  3. Synthroid 125 mcg p.o. daily.  4. Protonix 40 mg p.o. daily.  5. He is getting IV fluids normal saline at 90 mL per hour.  6. Tylenol p.r.n. basis.   ALLERGIES:  PENICILLIN.   SOCIAL HISTORY:  No history of alcohol abuse and no history of smoking  or drug use.  He lives in [place].   REVIEW OF SYSTEMS:  At this moment, he has no nausea, no vomiting, no  shortness of breath, dizziness, or lightheadedness.  His appetite is  reasonable.  No abdominal pain.  He feels somewhat weak, and he does not  have any urgency or frequency or swelling of his legs.   PHYSICAL EXAMINATION:  GENERAL:  The patient is in apparent distress.  Overall, he looks okay.  VITAL SIGNS:  Blood pressure 112/75, temperature 97.6, pulse 72.  CHEST:  Decreased breath sounds, otherwise seems to be clear.  No rales,  no rhonchi, no egophony.  HEART:  Regular rate and rhythm, no murmur.  ABDOMEN:  Soft, positive bowel sounds.  EXTREMITIES:  No edema.   His input and output is not documented.  His white blood cell count is  9.1; when he came in, it was 14.1.  Hemoglobin 13.6, hematocrit 41.3.  Sodium 130, potassium 4.1.  When he came, potassium was 5.6, CO2 13, BUN  76, creatinine 2.36.  When he came, his BUN was as high as 68 and  creatinine 3.68.  His calcium is 8.3.  His creatinine has been normal at  1 in October 2008.   IMPRESSION:  1. Renal insufficiency at  this moment seems to be acute as the patient      has elevated BUN and creatinine over the last couple of days.      Prior to that, his creatinine had been normal.  However, in a      patient with a history of aortic aneurysm status post repair, a      this moment, we cannot rule out underlying chronic renal failure or      possibly ischemic.  2. History of hyperkalemia, probably related to renal insufficiency.      Presently has improved.  3. Low CO2. Seems to be significant anion gap, possibly also related      to his renal insufficiency.  Since the patient has also history of      diarrhea, could be non-renal, and at this moment, however, seems to      be very low. Since we do not have any ABG, very difficult to say      whether it is secondary to metabolic acidosis or related to      respiratory alkalosis.  4. Hyponatremia.  Sodium  seems to be declining.  5. History of nausea, vomiting, possibly gastroenteritis.  6. History of chronic obstructive pulmonary disease.  He is on      inhaler.  7. History of peptic ulcer disease.  8. History of  hypothyroidism.  He is on Synthroid.  9. History of aortic aneurysm status post repair.  He is asymptomatic.   RECOMMENDATIONS:  1. Will do an ABG.  2. Will change his IV fluids to normal saline with 1 amp of sodium      bicarb and will continue his other treatments.  3. Will do ultrasound of the kidneys.  4. Will continue his other treatments and follow patient.      Jorja Loa, M.D.  Electronically Signed     BB/MEDQ  D:  09/24/2007  T:  09/24/2007  Job:  [id number]

## 2011-01-12 NOTE — Assessment & Plan Note (Signed)
OFFICE VISIT   FAISAL, STRADLING  DOB:  26-Aug-1921                                       08/16/2007  MWNUU#:[id number]   The patient presents for further followup today.  We have been following  him for previous repair of abdominal aortic aneurysm in October of 2007.  He has also had a known chronic right internal carotid artery occlusion  with minimal left internal carotid artery disease.  He states that,  since I last saw him a little over a year ago, he has had a cardiac  evaluation for possible MI, but this showed minimal disease that could  be managed medically.  He denies any abdominal or back pain.  His  appetite has improved, and he has regained most of his weight.  He has  had no further episodes of TIA, amaurosis, or stroke.  He continues to  take Plavix and aspirin.   PHYSICAL EXAM:  He has 2+ carotid pulses.  No bruits.  Chest is clear to  auscultation.  Abdomen is soft and nontender.  No palpable pulsatile  mass.  He has a well-healed ventral hernia repair with mesh.  This was  done by Dr. Johna Sheriff.  Blood pressure today was 156/84, heart rate 106  and regular.   Overall, the patient is doing well.  He is currently 77 years old and  seems to be in reasonable health.  He will follow up with me on as  needed basis.   Janetta Hora. Fields, MD  Electronically Signed   CEF/MEDQ  D:  08/16/2007  T:  08/17/2007  Job:  [id number]   cc:   Dr. Deeann Dowse

## 2011-01-15 NOTE — Discharge Summary (Signed)
NAME:  Luis Guerra, Luis Guerra NO.:  [id number]   MEDICAL RECORD NO.:  [id number]          PATIENT TYPE:  INP   LOCATION:  3022                         FACILITY:  [facility]   PHYSICIAN:  Lorne Skeens. Hoxworth, M.D.DATE OF BIRTH:  March 14, 1921   DATE OF ADMISSION:  07/12/2006  DATE OF DISCHARGE:  07/19/2006                               DISCHARGE SUMMARY   DISCHARGE DIAGNOSES:  Nausea and vomiting, dehydration, ileus.   OPERATIONS AND PROCEDURES:  Upper endoscopy, Dr. Ewing Schlein, on July 14, 2006.   CONSULTATION:  GI Dr. Ewing Schlein.   HISTORY PRESENT ILLNESS:  Luis Guerra is an 77 year old male recently  discharged from [facility] following elective abdominal aortic  aneurysm repair by Dr. Darrick Penna and repair of large ventral hernia with  intra-abdominal mesh by me on June 01, 2006.  Postoperatively, he had  a fairly prolonged episode of abdominal distension and nausea,  questionable ileus versus partial small bowel obstruction on x-rays  which gradually resolved, and the patient improved and was discharged  home on June 06, 2006.  He had resolution of his abdominal distension,  although continued to have poor appetite.  In the 2-3 weeks leading up  to this hospitalization he had worse appetite, no taste for food and has  began to have episodic small volume bilious vomiting.  He is taking in  very little by mouth.  He has had some epigastric pressure but no severe  pain.  Bowel movements have been regular.  He has had a 30-pound weight  loss in surgery including 10 pounds over the last week.  He presented to  [place] primary physician today due to weakness and was found to be  hypotensive.  Blood pressures was 60 systolic.  He was given IV fluids  and transferred to [facility] for further evaluation and treatment.  Past GI history significant for gastric resection in the 1960s for  peptic ulcer disease, type of reconstruction unknown.  The patient had  extensive adhesions at the  time of his abdominal aortic aneurysm repair  and hernia repair and his gastric and small bowel anatomy was not  completely delineated, but there was no evidence of obstruction at that  time.  Preoperative CT scan at the time of his AAA repair indicated a  questionable internal hernia, but there was no evidence for this at the  time of laparotomy.   PAST SURGICAL HISTORY:  1. Surgeries as above.  2. Partial gastrectomy in the 60s for peptic ulcer disease.  3. Cholecystectomy.  4. Previous ventral hernia repair in 2003.  5. Back surgery.  6. Rotator cuff surgery.   PAST MEDICAL HISTORY:  1. Medically, he is followed for hypertension, off medications.  2. Hypothyroidism.  3  COPD with history of tobacco abuse.  1. Hiatal hernia.  2. GERD.  3. Peptic ulcer disease.  7  Arthritis.   MEDICATIONS ON ADMISSION:  1. Advair discus 250/50 one inhalation b.i.d.  2. Theophylline 200 mg b.i.d.  3. Protonix  40 mg daily.   ALLERGIES:  PENICILLIN.   Social history, family history, review of systems see  detailed H&P.   PERTINENT LABORATORY DATA:  White count 11.6, hemoglobin 13.9.  Electrolytes abnormal for sodium of 129, bicarbonate is 18, BUN 44,  creatinine 1.7, albumin 3.4.  LFTs normal.  Flat and upright abdominal x-  ray showed normal bowel gas pattern.   HOSPITAL COURSE:  The patient was admitted for further evaluation and  treatment.  He underwent IV rehydration with good urine output and  resolution of his azotemia.  Dr. Ewing Schlein was consulted for EGD which was  performed on January 14.  This showed some moderate gastritis, question  bile gastritis and dilated stomach, possibly consistent with  gastroparesis.  The patient was started on Reglan and Carafate.  He had  also been started on T&A on admission via PICC line for nutritional  support.  The patient's symptoms improved quickly.  He was found to be  hypokalemic on 11/16, potassium 2.6 which was replaced.  Upper GI and   small-bowel follow-through was performed for further evaluation which  showed mildly dilated small bowel, slow transit and no transition zone.  At this point the patient was feeling dramatically better and his diet  was started and advanced.  He was eating well and was anxious to go  home.  Laboratory had normalized.  He is discharged home on July 19, 2006.  He is tolerating regular diet well.   DISCHARGE MEDICATIONS:  Same as admission plus Reglan and Carafate.   FOLLOWUP:  Follow-up is to be in my office in one week.      Lorne Skeens. Hoxworth, M.D.  Electronically Signed     BTH/MEDQ  D:  10/05/2006  T:  10/05/2006  Job:  [id number]   cc:   Petra Kuba, M.D.

## 2011-01-15 NOTE — H&P (Signed)
NAME:  Luis Guerra, Luis Guerra NO.:  [id number]   MEDICAL RECORD NO.:  [id number]          PATIENT TYPE:  INP   LOCATION:  3022                         FACILITY:  [facility]   PHYSICIAN:  Lorne Skeens. Hoxworth, M.D.DATE OF BIRTH:  February 18, 1921   DATE OF ADMISSION:  07/12/2006  DATE OF DISCHARGE:                              HISTORY & PHYSICAL   CHIEF COMPLAINT:  Nausea, weakness, poor appetite.   HISTORY OF PRESENT ILLNESS:  [name] is an 77 year old white male who  was recently admitted to [facility] and underwent repair of abdominal  aortic aneurysm by Dr. Fabienne Bruns, followed by repair of a large  ventral hernia with intra-abdominal mesh by me on June 01, 2006.  Postoperatively, the patient had either a prolonged ileus or partial  small-bowel obstruction with abdominal distention, but this gradually  improved and the patient was discharged home on June 16, 2006.  He  initially seemed to be doing okay, although had persistent poor  appetite.  In the recent several weeks, his appetite has been worse with  no taste for food.  He has also had episodic small volume of bilious  vomiting.  This does not occur with eating, but he has been taking in  very little.  He describes a small volume of thick bilious material  which is vomited after a brief period of epigastric pressure and nausea.  This initially was occurring once a day, but now occurring 2-3 times a  day.  He is not having any significant abdominal pain.  Bowel movements  have been regular.  The patient has had a 30-pound weight loss since  surgery and 10 pounds over the last week.  He presented to his primary  physician today due to some mild shortness of breath and weakness and  was found to be hypotensive with a blood pressure of 60 systolic.  He  was given IV fluids in her office and transferred to [facility] for  further admission.  His past GI history is significant for some type of  gastric resection  in the 1960s for peptic ulcer disease, type of  reconstruction unknown.  The patient had fairly extensive adhesions at  that time of his abdominal aortic aneurysm and hernia repair and his  gastric and small bowel anatomy was not completely delineated, but there  was certainly no evidence of obstruction or obvious problem at that  time.  He did have a CT scan preoperatively at the time of his AAA  repair that indicated a questionable internal hernia, but I did not see  any evidence of this at the time of his laparotomy.   PAST MEDICAL HISTORY:  1. Surgery as above.  2. Partial gastrectomy in the 1960s for peptic ulcer disease.  3. Cholecystectomy.  4. Previous ventral hernia repair in 2003.  5. Back surgery.  6. Rotator cuff surgery.  7. Medically, he has been followed for hypertension, but off      medications now, hypothyroidism, COPD with history of tobacco      abuse, hiatal hernia, GERD, peptic ulcer disease and arthritis.  MEDICATIONS AT THIS TIME:  1. Advair Diskus 250/50 one inhalation b.i.d.  2. Theophylline 300 mg b.i.d.  3. Theophylline 125 mcg daily.  4. Protonix 40 mg daily.   ALLERGIES:  PENICILLIN.   SOCIAL HISTORY:  Previous history of tobacco abuse.  No alcohol or  tobacco currently.  Married.   FAMILY HISTORY:  Noncontributory.   REVIEW OF SYSTEMS:  GENERAL:  Positive for weakness, no fever or chills.  RESPIRATORY:  Occasional mild shortness of breath, no cough or wheezing.  CARDIAC:  No chest pain or palpitations.  ABDOMEN/GI:  As above.  GU:  No urinary burning, or frequency.   PHYSICAL EXAM:  VITAL SIGNS:  Temperature is 96, heart rate is 75,  respirations 22, blood pressure 117/74.  GENERAL:  He is a thin elderly white male who does not appear acutely  ill.  SKIN:  Warm and dry without rash or infection.  HEENT:  No palpable mass or thyromegaly.  Sclerae anicteric.  Mucous  membranes dry.  Nares and oropharynx clear.  LUNGS:  Clear without  wheezing  or increased work of breathing.  CARDIAC:  Regular rate and rhythm with no murmurs.  I cannot feel pedal  pulses, but feet feel well perfused.  No bruits.  ABDOMEN:  Well-healed midline incision.  No recurrent hernia.  His  abdomen is soft and nondistended.  Nontender.  No palpable masses or  organomegaly.  No recurrent hernia.  EXTREMITIES:  No joint swelling or deformity.  NEUROLOGIC:  Alert, oriented.  Motor and sensory exams grossly normal.   LABORATORY:  White count mildly elevated at 11.6, hemoglobin 13.9,  platelets 324,000.  Electrolytes abnormal for a sodium of 129, bicarb of  18, BUN 44, creatinine 1.7.  Albumin is 3.4.  LFTs normal.   IMAGING STUDY:  Chest x-ray negative.   Flat and upright abdominal x-ray:  Normal bowel gas pattern, no  abnormalities.   ASSESSMENT AND PLAN:  Persistent poor appetite, nausea, occasional  bilious vomiting and weight loss following abdominal aortic aneurysm  repair and ventral hernia repair.  He now has a 30-pound weight loss  since surgery.  He now presents with dehydration, hypotension, mild  acute renal failure and hyponatremia.  The patient is being admitted for  further treatment and evaluation.  We will begin intravenous rehydration  and plan to place a peripherally inserted central catheter line and  start total nutrient admixture during his evaluation.  Diagnosis is  uncertain at this time.  He does not have evidence of obstruction on  examination clinically or by x-ray.  He could have recurrent peptic  ulcer disease, gastritis or possibly afferent limb syndrome.  I question  occult malignancy.  Rule out theophylline toxicity.  I will ask  Gastroenterology to see the patient to consider upper endoscopy.  Consider small-bowel follow-through or repeat CT if this is unrevealing.      Lorne Skeens. Hoxworth, M.D.  Electronically Signed    BTH/MEDQ  D:  07/12/2006  T:  07/13/2006  Job:  [id number]   cc:   Janetta Hora. Darrick Penna, MD

## 2011-01-15 NOTE — Op Note (Signed)
NAME:  Luis Guerra, Luis Guerra NO.:  [id number]   MEDICAL RECORD NO.:  [id number]          PATIENT TYPE:  INP   LOCATION:  2005                         FACILITY:  [facility]   PHYSICIAN:  Sharlet Salina T. Hoxworth, M.D.DATE OF BIRTH:  05-12-1921   DATE OF PROCEDURE:  06/01/2006  DATE OF DISCHARGE:  06/16/2006                               OPERATIVE REPORT   PREOPERATIVE DIAGNOSIS:  Ventral incisional hernia,   POSTOPERATIVE DIAGNOSIS:  Ventral incisional hernia,   SURGICAL PROCEDURES:  Repair of ventral incisional hernia with Kugel  mesh.   SURGEON:  Lorne Skeens. Hoxworth, M.D.   ANESTHESIA:  General.   BRIEF HISTORY:  [name] is an 77 year old male who requires  abdominal aortic aneurysm repair by Dr. Fabienne Bruns.  Preoperatively  the patient had a CT scan which questioned some internal hernia with  swirling of the small bowel mesentery.  The patient has a history of a  subtotal gastrectomy many years ago.  He also has a large, fairly  diffuse upper abdominal ventral incisional hernia.  With these findings  we had planned that following completion of abdominal aortic aneurysm  repair by Dr. Darrick Penna, I would examine the small bowel for any evidence  of internal hernia or possible site of obstruction and then proceed with  ventral hernia repair.   DESCRIPTION OF PROCEDURE:  Following completion of AAA repair by Dr.  Darrick Penna, I carefully explored the abdomen.  There were a number of small  bowel interloop adhesions.  However, there was no evidence of any  internal hernia, partial small-bowel obstruction or anatomic problem the  small bowel, and therefore no intervention was necessary in this regard.  There was very thinned midline fascia and a large hernia sac extending  from about the xiphoid to the umbilicus.  A large piece of Kugel mesh,  approximately 27 x 20 cm, was chosen.  All anterior abdominal wall  adhesions had been lysed.  The mesh was laid in the  abdominal cavity  with the PTFE side down with nice, broad coverage back to healthy  abdominal wall.  The mesh was then sutured up to the anterior abdominal  wall in a number of areas with interrupted 0 Prolene circumferentially.  Following this the Endo-Tacker was used to tack the mesh  circumferentially, leaving no gaps where bowel could herniate.  The skin  provided nice, broad coverage of the hernia defect and extended up over  the xiphoid superiorly and down to the level of the umbilicus  inferiorly.  Following this the attenuated fascia and hernia sac was  closed back over the mesh, incorporating the mesh with occasional bite  with running #1 PDS, begun at either end of the incision and tied  centrally.  The skin was closed with staples.  Sponge and needle counts  were correct.  A dry sterile dressing was applied.  The patient was  taken to the recovery room in satisfactory condition.      Lorne Skeens. Hoxworth, M.D.  Electronically Signed    BTH/MEDQ  D:  06/20/2006  T:  06/21/2006  Job:  [id number]

## 2011-01-15 NOTE — Op Note (Signed)
NAME:  Luis Guerra, Luis Guerra               ACCOUNT NO.:  [id number]   MEDICAL RECORD NO.:  [id number]          PATIENT TYPE:  AMB   LOCATION:  DAY                           FACILITY:  [facility]   PHYSICIAN:  Lionel December, M.D.    DATE OF BIRTH:  1921-03-04   DATE OF PROCEDURE:  02/19/2005  DATE OF DISCHARGE:                                 OPERATIVE REPORT   PROCEDURE:  Esophagogastroduodenoscopy.   INDICATIONS:  [name] is an 77 year old Caucasian male with remote  history of peptic ulcer disease with surgery who presents with epigastric  pain and nausea, vomiting and is also noted to have heme-positive stools. He  had been on Naprosyn and feels some better since this medicine was  discontinued. Procedure risks were reviewed with the patient, and informed  consent was obtained.   PREMEDICATION:  Cetacaine spray for pharyngeal topical anesthesia, Demerol  25 mg IV, Versed 2 mg IV.   FINDINGS:  Procedure performed in endoscopy suite. The patient's vital signs  and O2 saturation were monitored during the procedure and remained stable.  The patient was placed in left lateral position and Olympus videoscope was  passed via oropharynx without any difficulty into esophagus.   Esophagus. Mucosa of the esophagus was normal throughout. GE junction was at  39 cm from the incisors.   Stomach. It had some food debris in it. Very large stomach which distended  well. There was diffuse mucosal erythema granularity consistent with  gastritis. Patent gastrojejunostomy with some food debris in it and three  small ulcers at the anastomosis.   Pyloric channel was somewhat deformed but not stenotic. There was a small  pyloric channel ulcer. I was able to pass the scope across without any  difficulty. Angularis, fundus and cardia were normal.   Small bowel. Both afferent and efferent loops were examined. Both of them  had some food debris in it. However, no mass or stricture was noted.  Multiple pictures  taken for the record. Endoscope was withdrawn.   FINAL DIAGNOSIS:  1.  Anastomotic ulcer without stricture formation. Pyloric channel deformity      with an ulcer.  2.  Diffuse gastritis. Suspect underlying H pylori gastritis.  3.  Patent efferent and afferent loops.   RECOMMENDATIONS:  1.  He will continue his Protonix at 40 mg p.o. q.a.m.  2.  Reglan 5 mg before each meal.  3.  H pylori serology be checked.  4.  We will contact the patient with the results of above and plan to see      him back in the office in four weeks.       NR/MEDQ  D:  02/19/2005  T:  02/19/2005  Job:  [id number]

## 2011-01-15 NOTE — H&P (Signed)
NAME:  Luis Guerra, Luis Guerra               ACCOUNT NO.:  [id number]   MEDICAL RECORD NO.:  [id number]          PATIENT TYPE:  INP   LOCATION:  NA                           FACILITY:  [facility]   PHYSICIAN:  Janetta Hora. Fields, MD  DATE OF BIRTH:  10/05/1920   DATE OF ADMISSION:  06/01/2006  DATE OF DISCHARGE:                                HISTORY & PHYSICAL   CARDIOLOGIST:  Peter M. [place]il[place], M.D.   CHIEF COMPLAINT:  Abdominal aortic aneurysm.   HISTORY OF PRESENT ILLNESS:  This is an 77 year old, Caucasian male with  past medical history of hypertension, COPD with former tobacco abuse.  The  patient has been experiencing intermittent abdominal pain for the past 2  months.  The patient was sent for a CT scan workup which showed a 5.2 cm  abdominal aortic aneurysm with evidence of an internal hernia.  The patient  complains of pain every couple of weeks.  He has associated nausea and  vomiting at times.  He describes his pain as a sharp pain that was across  his lower abdomen.  The patient states that the pain eventually eases off.  He has not experienced any pain for the past 2 weeks.  The patient did see  Dr. Darrick Penna in the office on April 15, 2006, and he was set up for a CT  angiogram to evaluate him for open repair versus endovascular repair.  The  patient's CT scan on August 2007, showed a 5.2 x 4.8 infrarenal abdominal  aortic aneurysm.  Also, the patient showed an internal hernia throughout the  distal duodenum and proximal jejunum.  The patient underwent CT angiogram by  Dr. Fabienne Bruns which showed that although the upper portion of his  aneurysm would be amenable to endovascular repair, the iliac arteries are  too tortuous to consider endovascular repair.  Therefore, the patient will  require open repair.  The patient's CT angiogram showed accessory right  renal artery arises 4.2 cm distal to the main right renal artery.  Accessory  branch shows the lower pole of the right kidney.   Internal abdominal hernia  stable in appearance.   The patient denies any constipation, coffee-grounds emesis, back pain,  claudication symptoms, peripheral edema, dysuria, hematuria, reflux, angina,  palpitations, history of arrhythmias or TIA/CVA symptoms.   The patient did see Dr. Johna Sheriff in the office on May 06, 2006, to  discuss options of his hernia repair.  The patient is scheduled for Dr.  Johna Sheriff to fix his internal hernia at the same time of his aneurysm repair.  The patient did donate 2 units of packed red blood cells of myelogenous  blood.  The patient did undergo a Cardiolite study by Dr. Peter Swaziland.  A  resting EKG showed a marked sinus bradycardia, otherwise normal and he  tolerated the adenosine infusion well.  The patient had normal homogenous  uptake in all areas of the myocardium.  There is normal contractility and  thickening in all areas of the myocardium.  Overall, LV function is normal.  Overall impression was normal adenosine Cardiolite setting of the  left  ventricular function.   PAST MEDICAL HISTORY:  1. Hypertension.  2. History of abdominal aortic aneurysm.  3. History of tobacco abuse.  4. Hypothyroidism.  5. Gastroesophageal reflux disease.  6. Chronic obstructive pulmonary disease.  7. Bilateral knee arthritis.  8. History of hiatal hernia.   PAST SURGICAL HISTORY:  1. Right rotator cuff repair.  2. Back surgery.  3. Type of Billroth procedure.  4. Cholecystectomy.  5. Ventral hernia repair in 2003.   ALLERGIES:  PENICILLIN, reaction is sweating.   MEDICATIONS:  1. Advair 250/50 one puff b.i.d.  2. Theophylline 300 mg p.o. b.i.d.  3. Protonix 40 mg p.o. daily.  4. Doxazosin 2 mg 1-1/2 tablets p.o. daily.  5. Levothyroxine 125 mcg p.o. daily.  6. Diltiazem 180/24 mg p.o. daily.   REVIEW OF SYSTEMS:  See HPI for pertinent positives and negatives.  Negative  for renal insufficiency, cerebrovascular accident, DVT/pulmonary embolus,   cardiac arrhythmias, diabetes mellitus type 2 and myocardial infarction.   SOCIAL HISTORY:  The patient is widowed with five children.  He does live  with is lifelong partner.  The patient is a former tobacco user and he quit  29 years ago.  The patient occasionally drinks alcohol.  He does still drive  and is retired.   FAMILY HISTORY:  The patient has a positive family history of coronary  artery disease in his sister.  He denies any family history of abdominal  aortic aneurysms or cerebrovascular accidents.   PHYSICAL EXAMINATION:  VITAL SIGNS:  Blood pressure 120/75, heart rate 78,  respirations 16.  GENERAL:  This is an 77 year old, Caucasian male in no acute distress.  HEENT:  Normocephalic, atraumatic.  Pupils equal round and reactive to light  and accommodation.  Extraocular movements intact.  Oral mucosa pink and  moist.  Sclerae nonicteric.  The patient has dentures completely upper and  lower.  He wears a right hearing aide.  NECK:  Supple.  No carotid bruits heard to auscultation.  LUNGS:  Symmetrical on inspiration, unlabored.  The patient has scattered  wheezes in the right lower lobe, otherwise clear to auscultation.  CARDIAC:  Regular rate and rhythm.  No murmurs, rubs or gallops.  ABDOMEN:  Soft, nontender, nondistended.  Normoactive bowel sounds x4.  The  patient's abdomen is slightly obese with scars.  GENITALIA:  Deferred.  EXTREMITIES:  No lower extremity edema.  His lower extremity temperature is  warm.  The patient is 2+ radial, femoral, popliteal pulses bilaterally.  There is no dorsalis pedis or posterior tibial pulses were palpable.  NEUROLOGIC:  Nonfocal.  Gait is steady.  Strength 5+ bilaterally and  throughout.  Deep tendon reflexes 2+ and symmetric.   ASSESSMENT:  Abdominal aortic aneurysm.   PLAN:  1. Will admit the patient to [facility] on May 30, 2006, under     Dr. Fabienne Bruns.  2. The patient will undergo repair and graft of  abdominal aortic aneurysm.  3. Risks and benefits were explained to the patient in great detail.  Dr.      Darrick Penna has seen and evaluated the patient prior to admission and is in      agreement with the above.      Luis Holster, PA      Janetta Hora. Fields, MD  Electronically Signed    JMW/MEDQ  D:  05/30/2006  T:  05/31/2006  Job:  [id number]   cc:   Doylene Canning. Ladona Ridgel, MD  Lorne Skeens.  Hoxworth, M.D.  Jeri Cos, M.D.  Simonne Maffucci, M.D.

## 2011-01-15 NOTE — Consult Note (Signed)
NAME:  Luis Guerra, SENGER NO.:  [id number]   MEDICAL RECORD NO.:  [id number]          PATIENT TYPE:  INP   LOCATION:  3022                         FACILITY:  [facility]   PHYSICIAN:  Petra Kuba, M.D.    DATE OF BIRTH:  [birth year]   DATE OF CONSULTATION:  DATE OF DISCHARGE:                                   CONSULTATION   REASON FOR CONSULTATION:  Consult is for a 30 pound weight loss, failure to  thrive.  Dr. Johna Sheriff is concerned for ulcer disease, gastritis, possible  CA.   HISTORY OF PRESENT ILLNESS:  This is an 77 year old male who on October 3  underwent AAA repair and his second ventral hernia repair.  Immediately  afterwards he developed a postop ileus.  He was discharged on October 18  with normal bowel sounds and normal bowel movements.  Apparently the ileus  had resolved.  On November 13 he was readmitted by Dr. Johna Sheriff for failure  to thrive.  He reports vomiting 1-3 times a day of green bilious material  that is not food.  He reports an epigastric tightness after he eats.  He  tolerates liquids but does not tolerate solids well at all.  He has no  appetite.  He says he has no taste and that all food taste like an old dish  rag.  He is having bowel movements 1-2 per day.  He says some are firm, some  are loose.  He denies any melena, hematochezia, hematemesis, heart burn,  dysphagia.  Denies NSAID use, alcohol, or pain medications use.  As I said  he is tolerating full liquids.  He has been on Protonix consistently for 2  years.  He had an upper endoscopy in 2006 by Dr. Karilyn Cota, who found a pyloric  channel deformity with an ulcer, gastritis, and he also had an ulcer on his  anastomosis from a previous surgery.  At that time his H. pylori serology  was negative.   PAST MEDICAL HISTORY:  Is significant for his cholecystectomy, previous  ventral hernia repair in 2003, back surgery, rotator cuff surgery, as well  as what is described in E chart as a partial  gastrectomy in the 1960's for  peptic ulcer disease.  When I talked to the patient about this he describes  more of an esophageal issue, so I am unclear what that surgery was.  Medically he has been noted for hypertension, hyperthyroidism, COPD.  He has  a history of tobacco use, hiatal hernia, GERD, peptic ulcer disease, and  arthritis.   CURRENT MEDICATIONS:  Include Protonix 40 mg daily.  The rest are per chart  that he is not on any Coumadin or any coagulants.   ALLERGIES:  He is allergic to PENICILLIN and has been told not to use  ASPIRIN.   SOCIAL HISTORY:  Is negative for alcohol or tobacco at this time.  He is  married.  His family history is noncontributory.   REVIEW OF SYSTEMS:  Positive for weakness but no recent fever or chills.  As  I mentioned he says that he  has no sense of taste, everything taste like an  old dish rag.  He has no appetite.  He has occasional shortness of breath  but no cough or wheezing.  He reports no chest pains, no urinary burning or  frequency.  On physical exam his current vital signs are temperature 97.9,  pulse 59, respirations are 18, blood pressure is 110/58.  He is alert and  oriented and in no apparent distress.  His heart has a regular rate and  rhythm.  His lungs are clear to auscultation bilaterally.  His abdomen is  soft non-tender, has decreased bowel sounds.  He has a midline scar that  appears to be well-healed.  He has no bruits or organomegaly noted.  His  extremities show no edema.  CBC was done yesterday November 13 showed a  hemoglobin of 13.9, hematocrit 41.6, a white count of 11.6, platelets were  324.  CHEM-7 also done yesterday, sodium 129, potassium 4.2, chloride 102,  bicarbonate 18, BUN 44, creatinine 1.7, glucose 104.  Lipase on November 13  was 109.  The alkaline level was low.  Cardiac markers show no evidence of  MI.  X-ray done on November 13 showed stool and bowel gas within normal  limits.  CT scan done back on  October 16 only showed stable small right  adrenal adenoma.   ASSESSMENT/PLAN:  This is an 77 year old gentleman, who has failure to  thrive, who has had green bilious vomiting since his abdominal aortic  aneurysm repair in early October.  He has been seen and examined by Dr. Vida Rigger whose plan of care is to start with an endoscopy evaluation this  afternoon at approximately 3:30.      Stephani Police, PA    ______________________________  Petra Kuba, M.D.    MLY/MEDQ  D:  07/13/2006  T:  07/13/2006  Job:  [id number]   cc:   Lorne Skeens. Hoxworth, M.D.  Petra Kuba, M.D.

## 2011-01-15 NOTE — Op Note (Signed)
NAME:  Luis Guerra, DEEG NO.:  [id number]   MEDICAL RECORD NO.:  [id number]          PATIENT TYPE:  INP   LOCATION:  3022                         FACILITY:  [facility]   PHYSICIAN:  Petra Kuba, M.D.    DATE OF BIRTH:  [birth year]   DATE OF PROCEDURE:  07/13/2006  DATE OF DISCHARGE:                                 OPERATIVE REPORT   PROCEDURE:  Esophagogastroduodenoscopy with biopsy.   INDICATIONS:  Patient with vomiting, weight loss, feeding problem with  history of previous gastric surgery.  Consent was signed after risks,  benefits, methods, and options were thoroughly discussed with the patient  prior to any medicines given.   MEDICINES USED:  Fentanyl 35 mcg, Versed 3.5 mg.   DESCRIPTION OF PROCEDURE:  The video endoscope was inserted by direct  vision.  He did have a tiny hiatal hernia. Just above that was an area with  a linear scar possibly due to a surgical procedure.  He does say he had his  esophagus operated on in the past.  Possibly was a diverticulum or a scar  from previous ulceration or NG trauma.  He did have some mild esophageal  inflammation.  The scope was passed into the stomach and a moderate amount  of bilious material was suctioned.  The stomach was dilated and had  significant gastritis. He did have both a gastrojejunostomy. We could  advance a moderate way down one of the limbs, we could see the opening of  the second limb but based on a tough angle, we could not advance the scope  into that second part of the limb.  His pylorus was scarred but it was  patent and we could easily advance the scope through it.  The ampulla was  seen and we could advance a fair ways down his native duodenum.  This was  slightly dilated, as well. The scope was withdrawn back to the stomach which  was evaluated on straight and retroflex visualization and other than the  significant gastritis and a small antral nodule which was biopsied, no other  abnormalities  were seen.  A few biopsies of the gastritis were obtained, as  well, both proximally and distally.  Air was suctioned.  The scope was  slowly withdrawn. Again, his esophagus was evaluated on withdrawal which  confirmed the above findings.  The scope was removed.  The patient tolerated  the procedure well.  There was no immediate complication.   ENDOSCOPIC DIAGNOSES:  1. Tiny hiatal hernia with some esophageal inflammation.  2. Questionable distal esophageal surgery scar versus linear diverticulum      or scar from previous ulceration.  3. Dilated stomach with significant gastritis and a small antral nodule      status post biopsy.  4. Patent pylorus, his ampulla and native duodenum was evaluated and      normal.  5. Gastrojejunostomy with one normal limb, we could advance a moderate way      down, but a questionable short limb we could only advance into the      opening but based on  a difficult turn, could not advance down  6. Otherwise, normal EGD.   PLAN:  Will try Carafate for some bile reflux gastritis and add some Reglan  for some delayed gastric emptying, probably proceed with an upper GI small  bowel follow-through next in a day or two if he does not improve mass.  I  would radiology to try to follow the barium and evaluate both of his limbs,  if not all three, although my guess is the afferent limb of the  gastrojejunostomy is quite small.  Will follow with you.           ______________________________  Petra Kuba, M.D.     MEM/MEDQ  D:  07/13/2006  T:  07/13/2006  Job:  [id number]   cc:   Lorne Skeens. Hoxworth, M.D.

## 2011-01-15 NOTE — Discharge Summary (Signed)
NAME:  Luis Guerra, Luis Guerra NO.:  [id number]   MEDICAL RECORD NO.:  [id number]          PATIENT TYPE:  INP   LOCATION:  5156                         FACILITY:  [facility]   PHYSICIAN:  Sharlet Salina T. Hoxworth, M.D.DATE OF BIRTH:  [birth year]   DATE OF ADMISSION:  10/22/2007  DATE OF DISCHARGE:  11/06/2007                               DISCHARGE SUMMARY   ADMITTING PHYSICIAN:  Adolph Pollack, M.D.   DISCHARGING PHYSICIAN:  Lorne Skeens. Hoxworth, M.D.   OPERATING SURGEON:  Sharlet Salina T. Hoxworth, M.D.   CONSULTATIONSEduard Clos, MD with [facility].   REASON FOR ADMISSION:  Partial small bowel obstruction.   HISTORY OF PRESENT ILLNESS:  This is an 77 year-old white male who had  been having intermittent episodes of abdominal bloating, nausea, and  vomiting with no pain.  He states that on the day of admission he had a  bowel movement as well as on the day prior.  However, he had had bilious  emesis on the day of admission and at that time was taken to [facility]  Emergency Department.  There he was evaluated and noted to be severely  dehydrated with BUN in the 70s and a creatinine of 4.75.  He then had a  CT scan, which suggested a possible proximal small bowel obstruction.  At that time, they called the general surgeon there but since he had  previous surgeries here, it was recommended that he be transferred to  [facility] and therefore was brought here for evaluation.  He states that his  problems started after he had an abdominal aortic aneurysm repair and a  ventral hernia repair with a mesh in October 2007.  He states that at  this time his episodes became more frequent and was last admitted to  [facility] for an episode like this with severe dehydration on September 23, 2007.  At this time on examination the patient's abdomen was soft,  nontender with a midline incision with very irregular foreign body  palpable consistent with mesh.  He did have few bowel sounds,  no high-  pitched bowel sounds with no significant distention.   LABORATORY DATA:  At this time reveals a sodium of 126, potassium of  4.9, bicarb of 16, BUN 73, creatinine 4.75, glucose of 152, hemoglobin  of 17.4, and a white count 22,300.  A CT scan was reviewed, which showed  a proximal small bowel and gastric dilatation with a transition point  near the anterior abdominal wall.  However, there is distal colonic air  present.  At this time, it was felt that the patient was having another  episode of recurrent partial small bowel obstruction and due to his  severe dehydration with acute renal insufficiency, the patient was  admitted to the hospital with aggressive hydration therapy.  At this  time, an NG tube was also placed to help decompress the patient.  At  this time, conservative management was the treatment of choice.  However, the patient was made aware that if this did not get better,  then an eventual exploratory laparotomy possibly  needed to be done.   ADMITTING DIAGNOSES:  1. Recurrent partial small bowel obstruction.  2. Severe dehydration.  3. Acute renal insufficiency.  4. Hyponatremia.  5. Chronic obstructive pulmonary disease.  6. History of coronary artery disease.   HOSPITAL COURSE:  On hospital day #1, the patient was not complaining of  any abdominal pain and states that at this time he was passing very  little flatus.  His sodium had come up to 131 from 126 and his white  count had gone down to 12,500.  At this time, his BUN had decreased to  68 and his creatinine to 3.66.  At this time, the patient's NG tube was  kept in place with abdominal x-rays showing no obstruction of free air  but repeat x-rays were ordered for the next day.  At this time, Dr.  Ezzard Standing felt that internal medicine needed to be seeing the patient due  to his acute renal insufficiency, his malnutrition, COPD,  hypothyroidism, and his hyponatremia.  At that time, Dr. Toniann Fail was  the  consulting physician, and he felt that at this time the patient's IV  fluids needed to be changed to D5 with bicarbonate in it to help with  rehydration as well as the patient's other issues.  He also at this time  thought that the patient needed to have a renal ultrasound done.  On  hospital day #2, the patient had no complaints.  However, was having  high output from his NG tube.  His white count was continuing to  decrease and by day #2 it had normalized to 9.2.  His BUN slightly  decreased to 66 and his creatinine to 2.59.  His repeat abdominal x-rays  showed gaseous distention of small bowel loops.   On physical exam, the patient's abdomen was soft, nontender,  nondistended, and positive bowel sounds.  Because at this time we felt  that the patient may have a partial small bowel obstruction that was  continuing to persist and due to the patient not being able to tolerate  a diet for several days prior, we felt that the patient needed to be  placed on TNA.  The patient at this time continued with the NG tube and  bowel rest.  Prior surgical patient of Dr. Johna Sheriff, and the renal  ultrasound was also done, which showed findings consistent with  nonspecific renal medical disease but no hydronephrosis.  Because of  this, the patient's IV fluid rehydration was continued.  At this time  the patient's partial small bowel obstruction symptoms did not begin to  improve.  Dr. Johna Sheriff who is the patient's prior surgeon was notified.  Dr. Johna Sheriff evaluated the patient and stated that he felt like the  patient would  need an exploratory or laparoscopic procedure done in  order to fix his partial small obstruction potentially due to an  adhesion.  At this time, he felt that the patient due to his  malnutrition needed to be put on TNA for approximately 5 to 7 days prior  to surgery to get his nutritional status better to help with healing.  Therefore, over the next several days, the patient  continued with an NG  tube, TNA and bowel rest.  On hospital day #3, repeat labs were drawn,  which showed an improving BUN and creatinine of 57 and 2.07.  Over the  next several days, repeat labs were drawn, which showed an improving BUN  and creatinine each day.  Otherwise up  until hospital day #8, the  patient continued as previously stated with no further problems.  On  hospital day #9, the patient was taken to the OR by Dr. Johna Sheriff and a  laparoscopic lysis of adhesions for small bowel obstruction was  performed.  The patient tolerated this procedure well and later returned  back to his room with his NG tube intact.     On postoperative day #1, the patient's white count had elevated  slightly and was at 14,300.  By this time, his BUN and creatinine had  improved to 51/1.63.  The patient at this time complained of some  abdominal soreness, but at this time he was wanting to get up and walk  around.  His abdomen on exam was soft and tender across his lower  abdomen.  However, he was not distended and did have good bowel sounds.  His incisions at this time were clean, dry, and intact without any  erythema or drainage.  At this time, the patient's NG tube was removed,  and the patient was allowed to have still nothing by mouth.  The patient  was still continued on his TNA.  By postoperative day #2, the patient  was feeling very well and began passing some flatus.  The patient at  this time was not having any nausea or vomiting.  His abdomen was still  soft with some mild tenderness in the left upper quadrant.  The patient  at this time was allowed to have small sips of clear liquids.  At this  time, his Foley was discontinued and the patient was told he may get out  of bed and sit in his chair or ambulate.  By postoperative day #3, the  patient was tolerating sips of clear liquids with no nausea or vomiting.  However, he did have some diarrhea.  So at this time the patient was   advanced to clear liquids.  Over the next several days, the patient  continued to not have any nausea or vomiting, and his diet continued to  be advanced, and by postoperative day #5, the patient was tolerating a  regular diet.  On the same day, the patient's TNA was also discontinued  as well.  By the time the patient was discharged on hospital day #6, his  BUN and creatinine had normalized down to 22 and 1.02.  On hospital day  #6, the patient had no complaints and was tolerating his regular diet  with no nausea or vomiting.  On exam, his abdomen was soft, nontender,  nondistended with positive bowel sounds.  At this time, the patient was  felt to be ready for discharge.  Therefore at this time orders were  placed to discontinue his PICC line and then to send him home.   DISCHARGE DIAGNOSES:  1. Partial small bowel obstruction.  2. Status post laparoscopic lysis of adhesions.  3. Acute renal insufficiency, which at this point had resolved.  4. Severe protein-calorie malnutrition for which he required TNA.  5. Chronic obstructive pulmonary disease.   DISCHARGE MEDICATIONS:  The patient was told that he can continue all of  his home medications, which include Advair, theophylline, Protonix,  levothyroxine, Plavix, and mirtazapine.  The patient was also given a  prescription for Vicodin 5/325 and informed to take 1-2 tablets every 4-  6 hours as needed for pain.   DISCHARGE INSTRUCTIONS:  The patient was told that he may return home  with no dietary restrictions, and he may  increase his activities slowly  and may walk up steps.  He was also informed that he may take a shower,  however he is not to bathe for the next 2 weeks.  He was also told that  he may not lift anything for the next 2 weeks greater than 10 pounds.  He was also informed that he is to call our office if he begins to get a  fever greater than 101.5 or has any new or severe abdominal pain or  begins to get any of the  same symptoms that he had prior to admission.  He also already has an appointment set up with his primary care  physician, and he was told that he needs to keep this appointment.  Otherwise, he was told that he needs to return to see Dr. Johna Sheriff in  approximately 2 weeks for follow up.      Letha Cape, PA      Lorne Skeens. Hoxworth, M.D.  Electronically Signed    KEO/MEDQ  D:  11/29/2007  T:  11/30/2007  Job:  [id number]   cc:   Eduard Clos, MD

## 2011-01-15 NOTE — Op Note (Signed)
NAME:  Luis Guerra, Luis Guerra NO.:  [id number]   MEDICAL RECORD NO.:  [id number]          PATIENT TYPE:  INP   LOCATION:  2005                         FACILITY:  [facility]   PHYSICIAN:  Janetta Hora. Fields, MD  DATE OF BIRTH:  [birth year]   DATE OF PROCEDURE:  06/01/2006  DATE OF DISCHARGE:                                 OPERATIVE REPORT   PROCEDURE:  Repair of abdominal aortic aneurysm.   PREOPERATIVE DIAGNOSIS:  Abdominal aortic aneurysm.   POSTOPERATIVE DIAGNOSIS:  Abdominal aortic aneurysm.   ANESTHESIA:  General.   ASSISTANT:  Gretta Began, MD, Gershon Crane, PA-C   OPERATIVE FINDINGS:  1. Infrarenal abdominal aortic aneurysm.  2. 18-mm straight graft.   OPERATIVE DETAIL:  After obtaining informed consent, the patient taken the  operating room.  The patient placed supine position operating table.  After  induction of general anesthesia endotracheal intubation, a Foley catheter,  nasogastric tubes were placed.  Next the patient's entire abdomen, upper  thighs were prepped and draped usual sterile fashion.  A midline laparotomy  incision was made in the upper abdomen through an old scar.  This was  carried down below the umbilicus down to the level of pubis.  Incision was  opened throughout its entire length down the level of fascia.  Fascia was  incised.  Peritoneum was opened.  There were a few adhesions up to the  abdominal wall.  These were taken down with cautery dissection.  There was a  large hernia defect in the mid section of the preexisting incision.  After  the incision had been opened for its full length through the peritoneum, the  small bowel was reflected to the right and the colon reflected superiorly.  There were some adhesions in the upper abdomen from previous ulcer surgery.  These were not taken down.  Next the retroperitoneum was opened using  cautery dissection.  The aneurysm was adherent to the duodenum.  This was  not taken down.  The IMA was  located and found to be chronically occluded.  Dissection was carried up to the level of the renal arteries.  The renal  vein was reflected superiorly.  A small tear was made in this posteriorly  and this was repaired with two 5-0 Prolene sutures.  After the proximal neck  had been cleared for clamping, dissection proceeded down to the level of the  aortic bifurcation.  The left common iliac artery was dissected free  circumferentially and controlled with an umbilical tape.  The right common  iliac vein was quite adherent posteriorly to the right common iliac artery.  Therefore, only the anterior surface of the iliac artery was dissected free.  Next, the patient was given 7000 units of intravenous heparin.  The aorta  was clamped proximally just below the renal arteries..  The patient was also  given 25 mg of mannitol.  The left and right common iliac arteries were then  clamped with Henley clamps.  The aneurysm was opened and the thrombus  contents removed.  Several lumbar arteries were oversewn using silk sutures.  The aortic  neck was transected.  Then 18 mm graft was brought up on the  operative field and this was sewn end-to-end to the aorta using a running 3-  0 Prolene suture.  At completion anastomosis this was tested.  There was  found to be a small air leak posteriorly and this was repaired with two 3-0  Prolene sutures.  Clamp was again removed and the anastomosis was found be  hemostatic.  Next the graft was pulled down to length to the aortic  bifurcation.  Graft was cut to appropriate length.  Graft was then sewn end-  to-end to the aortic bifurcation using a running 3-0 Prolene suture.  Just  prior to completion anastomosis, this was fore bled, back bled, thoroughly  flushed.  Anastomosis was secured, clamps released to the right common iliac  artery first.  There was a brief pressure drop which recovered quickly.  Clamp was then removed from the left common iliac artery.   Again there was a  brief pressure drop which recovered quickly.  The patient's feet were  inspected and found to be pink and warm.  At this point the patient was  given 30 mg of protamine.  Next the retroperitoneum and aneurysm sac were  closed using a running 3-0 Prolene suture.  The IMA was again inspected from  the inner surface of the aneurysm sac and found be chronically occluded.  Next, the left colon was inspected and found be pink and viable.  The small  bowel was returned to its normal position.  At this point Dr. Jaclynn Guarneri  came into the operating room to inspect the patient's small bowel for any  evidence of internal hernia.  Additionally a ventral hernia repair with mesh  was performed.  This is dictated as separate operative note.  The closure of  the abdomen was with staples.  This was also dictated as a separate portion  operative note by Dr. Johna Sheriff.  The patient tolerated procedure well and  there were no complications.  Instrument, sponge, needle counts correct at  end of the case.  The patient taken to recovery room in stable condition.      Janetta Hora. Fields, MD  Electronically Signed     CEF/MEDQ  D:  06/01/2006  T:  06/02/2006  Job:  [id number]

## 2011-01-15 NOTE — Consult Note (Signed)
NAME:  Luis Guerra, Luis Guerra               ACCOUNT NO.:  [id number]   MEDICAL RECORD NO.:  [id number]          PATIENT TYPE:  AMB   LOCATION:  DAY                           FACILITY:  [facility]   PHYSICIAN:  Lionel December, M.D.    DATE OF BIRTH:  02-Feb-1921   DATE OF CONSULTATION:  02/17/2005  DATE OF DISCHARGE:                                   CONSULTATION   REFERRING PHYSICIAN:  Dr. Laural Benes.   REASON FOR CONSULTATION:  Epigastric mid abdominal pain, early satiety,  nausea, vomiting.   HISTORY OF PRESENT ILLNESS:  Luis Guerra is a 77 year old Caucasian male  with history of complicated peptic ulcer disease in 1968 status post  surgery.  He is unsure if he had partial gastrectomy.  He notes over the  last five weeks he has had epigastric mid abdominal fullness and irritation  and knot in his stomach.  He also has had intermittent nausea and vomiting.  He does feel somewhat better post emesis.  He also complains of anorexia and  early satiety.  Denies dysphagia or odynophagia.  He had been on Naprosyn  for a year for arthritic pain in both knees.  This was discontinued about a  month ago.  He reports a 20-pound weight loss in the last five weeks.  He  denies any hematemesis.  He has been on Protonix 40 mg daily for the last  month, although this really has not changed his symptoms much.  He denies  any rectal bleeding or melena.  Bowel movements are pretty much anywhere  from 1 to 3 times a day.  He does complain of some indigestion, denies any  heartburn.  Denies any over-the-counter NSAID use.   PAST MEDICAL HISTORY:  1.  COPD.  2.  Complicated peptic ulcer disease in 1968 with possible partial      gastrectomy.  3.  Hypertension.  4.  BPH.  5.  Back surgery.  6.  Cholecystectomy three years ago secondary to cholelithiasis with history      of gallstone pancreatitis.  7.  Incisional hernia repair.  8.  Colonoscopy five years ago, which was normal, in [place] by Dr. Donavan Foil.  9.  Bilateral  knee arthritis.   MEDICATIONS:  1.  Cardizem 180 mg daily.  2.  Doxazosin 2 mg 1-1/2 tablets daily.  3.  Theophylline 300 mg b.i.d.  4.  Levothyroxine 100 mcg daily.  5.  Advair 250/50 mg b.i.d.  6.  Protonix 40 mg daily.  7.  Multivitamins daily.  8.  Tylenol p.r.n.   ALLERGIES:  PENICILLIN and ASPIRIN.   FAMILY HISTORY:  No known family history of colorectal cancer or lower GI  problems.  Mother deceased at age 50 secondary to carcinoma of unknown  etiology.  Father deceased at age 57 due to coronary artery disease.  He has  multiple siblings, all of whom are relatively healthy.   SOCIAL HISTORY:  Mr.  Guerra is a widower and has been a widower since 25.  He does have a care giver with whom he lives, Luis Guerra.  He is retired  from [employer].  He  reports a 25-pack-yar history, quitting in 1959.  Denies any alcohol or drug use.   REVIEW OF SYSTEMS:  CONSTITUTIONAL:  Significant weight loss; see HPI.  Denies fever or chills.  Denies night sweats.  CARDIOVASCULAR:  No history  of palpitations.  PULMONARY:  Denies shortness of breath, dyspnea, cough,  hemoptysis.  GI:  See HPI.   PHYSICAL EXAMINATION:  VITAL SIGNS:  Weight 170 pounds, height 72 inches.  Temperature 98, blood pressure 128/78, pulse 80.  GENERAL:  Luis Guerra is a 77 year old Caucasian who is alert, oriented,  cooperative, no acute distress.  HEENT:  Sclerae clear, nonicteric.  conjunctivae pink.  Oropharynx pink and  moist without lesion.  NECK:  Supple without adenopathy or thyromegaly.  HEART:  Regular rate and rhythm.  Normal S1 and S2.  LUNGS: Good even respirations and emphysematous changes throughout.  No  acute distress.  ABDOMEN:  He does have a large midline scar with incisional ventral hernia  and diastasis rectus.  Positive bowel sounds x 4.  No bruits auscultated.  Soft, nontender, nondistended.  Without mass or hepatosplenomegaly.  No  rebound, tenderness, or guarding.  RECTAL:  No  external lesions visualized.  Good sphincter tone given his age.  Internal exam without evidence of mass.  Small amount of moderate brown  stool obtained from the vault which is grossly hemoccult positive.  EXTREMITIES:  Trace bilateral lower extremity edema.  SKIN:  Pink, warm, dry without rash or jaundice.   IMPRESSION:  Luis Guerra is a 77 year old Caucasian male with a five-week  history of epigastric pain, intermittent nausea and vomiting, early satiety,  and anorexia.  He also reports a 20-pound weight loss in the last five  weeks.  He is found to be hemoccult positive on exam today.  He has history  of peptic ulcer disease, and his symptoms are suspicious for this.  Of  course, occult malignancy needs to be ruled out as well given his  significant weight loss.   RECOMMENDATIONS:  1.  Will obtain a STAT CBC.  2.  Will schedule EGD with Dr. Karilyn Cota as soon as possible.  I discussed this      procedure including risks and benefits to include bowel injury,      bleeding, infection, perforation, drug reaction, __________  3.  I have asked him to continue Protonix 40 mg daily.  4.  I have asked him to go immediately to the emergency room if he develops      chest pain, shortness of breath, melena, or any worsening signs or      symptoms.   We would like to thank Dr. Laural Benes for allowing [place] to participate in the  care of Luis Guerra.       KC/MEDQ  D:  02/17/2005  T:  02/17/2005  Job:  [id number]

## 2011-01-15 NOTE — Discharge Summary (Signed)
NAME:  Luis Guerra, Luis Guerra NO.:  [id number]   MEDICAL RECORD NO.:  [id number]          PATIENT TYPE:  INP   LOCATION:  2005                         FACILITY:  [facility]   PHYSICIAN:  Rowe Clack, P.A.-C. DATE OF BIRTH:  1921/01/25   DATE OF ADMISSION:  06/01/2006  DATE OF DISCHARGE:  06/11/2006                                 DISCHARGE SUMMARY   HISTORY OF PRESENT ILLNESS:  The patient is an 77 year old Caucasian male  with a past medical history of hypertension, COPD, and former tobacco abuse.  The patient has been experiencing intermittent abdominal pain for  approximately two months.  Upon evaluation a CT scan was obtained in the  work up and this has revealed a 5.2 cm abdominal aortic aneurysm, as well as  evidence of internal hernia.  The patient complains of pain every couple of  weeks.  He has associated nausea and vomiting at times.  He described his  pain as sharp across his lower abdomen.  The patient stated that the pain  eventually eases off and he has not had any pain in the approximate two  weeks prior to admission.  The patient saw Dr. Darrick Penna in the office on  April 15, 2006, and at that time his CT angiogram was arranged for him to  evaluate for further surgical planning with open repair versus endovascular  repair.  The CT scan showed that his upper portion was acceptable but the  iliac arteries were too tortuous for endovascular repair.  The patient was  subsequently scheduled for admission this hospitalization for open repair.  The patient was also evaluated by Dr. Adriana Mccallum in discussion of his hernia  repair.  The plan was for Dr. Adriana Mccallum to evaluate and treat his internal  hernia at the same time as the aneurysm procedure.  Of note, the patient  also underwent preop cardiac evaluation including a normal adenosine  Cardiolite in the setting of normal left ventricular function.  The patient  was cleared from this viewpoint for proceeding with  surgery and he was  admitted this hospitalization for the procedure as described above.   PAST MEDICAL HISTORY:  1. Hypertension.  2. Abdominal aortic aneurysm.  3. History of tobacco abuse.  4. Hypothyroidism.  5. Gastroesophageal reflux disease.  6. Chronic obstructive pulmonary disease.  7. Bilateral knee arthritis.  8. History of hiatal hernia.   PAST SURGICAL HISTORY:  1. Right rotator cuff repair.  2. Back surgery.  3. Billroth procedure.  4. Cholecystectomy.  5. Ventral hernia repair in 2003.   ALLERGIES:  PENICILLIN CAUSES SWEATING.   MEDICATIONS PRIOR TO ADMISSION:  1. Advair 250/50 one puff b.i.d.  2. Theophylline 300 mg b.i.d.  3. Protonix 40 mg daily.  4. Doxazosin 2 mg 1-1/2 tablet daily.  5. Levothyroxine 125 mcg daily.  6. Diltiazem 180 daily.   FAMILY HISTORY, SOCIAL HISTORY, REVIEW OF SYSTEMS:  Please see the history  and physical done at the time of admission.   HOSPITAL COURSE:  The patient was admitted electively on June 01, 2006.  The patient underwent the following procedure:  Repair of abdominal aortic  aneurysm using 18 mm straight Dacron Hemashield graft.  The patient  tolerated this procedure well.  Additionally, the patient underwent repair  of ventral hernia by Dr. Adriana Mccallum.  He was evaluated intraoperatively for  internal hernias.  There was no finding that warranted further procedure per  Dr. Hardin Negus opinion.   POSTOPERATIVE HOSPITAL COURSE:  The patient has done well.  He has had a  somewhat prolonged ileus but it has shown a slow resolution.  He has  remained hemodynamically stable.  His incisions are all healing well.  He  has advanced his activity, commenced for the level of postoperative  convalescence using routine protocols.  His overall status is felt to be  acceptable for tentative discharge in the morning of June 11, 2006,  pending morning round reevaluation.   MEDICATIONS ON DISCHARGE:  Include the following:  1.  Advair 250/50 one puff twice a day.  2. Theophylline 300 mg b.i.d.  3. Protonix 40 mg daily.  4. Doxazosin 2 mg 1-1/2 tablet daily.  5. Synthroid 125 mcg daily.  6. Diltiazem 180 daily for pain.  7. Oxycodone 5 mg 1-2 every 4 hours p.r.n.   Follow up will be included on the instructions to see Dr. Darrick Penna as well as  staple removal.   FINAL DIAGNOSES:  1. Abdominal aortic aneurysm, now status post repair.  2. Ventral hernia, now status post repair.  3. Hypertension.  4. History of tobacco abuse.  5. Hypothyroidism.  6. Gastroesophageal reflux.  7. Chronic obstructive pulmonary disease.  8. Bilateral knee arthritis.  9. History of hiatal hernia.  10.Previous surgeries as listed per the dictation.      Rowe Clack, P.A.-C.     Sherryll Burger  D:  06/10/2006  T:  06/11/2006  Job:  [id number]   cc:   Peter M. [place], M.D.  Lorne Skeens. Hoxworth, M.D.  Doylene Canning. Ladona Ridgel, MD  M.D. Kayren Eaves

## 2011-01-15 NOTE — Discharge Summary (Signed)
NAME:  Luis Guerra, HUE NO.:  [id number]   MEDICAL RECORD NO.:  [id number]          PATIENT TYPE:  INP   LOCATION:  2005                         FACILITY:  [facility]   PHYSICIAN:  Janetta Hora. Fields, MD  DATE OF BIRTH:  05-14-1921   DATE OF ADMISSION:  06/01/2006  DATE OF DISCHARGE:  06/16/2006                                 DISCHARGE SUMMARY   ADDENDUM:   FINAL DIAGNOSIS:  Small bowel obstruction verus persistent ileus status post  abdominal aortic aneurysm repair.   Patient's discharge was held on June 11, 2006 due to patient having  nausea and vomiting.  The patient was sent for another abdominal series that  did see progression of the ileus.  This showed a dilated small bowel with A  through F levels.  On June 12, 2006, the patient was put n.p.o. and IV on  hydration.  Patient was restarted on diet on June 13, 2006, which he was  able to tolerate.  The patient had a KUB, on June 14, 2006, that showed  an increase in small bowel dilation and CT was recommended.  CT scan of the  abdomen showed a small bowel obstruction versus persistent ileus, a moderate  dilated small bowel, no obstruction.  On June 15, 2006, the patient was  started on a soft low-residue diet, which he tolerated well.  The patient's  vital signs have remained stable.  He has remained afebrile.  The patient  will be discharged home on June 16, 2006 in good condition.   PHYSICAL EXAMINATION:  VITAL SIGNS:  Temperature 97.5, heart rate 88,  respirations 18, BP 142/79, O2 sat 90% on room air.  HEART:  Regular rhythm.  RESPIRATIONS:  Clear to auscultation bilaterally.  ABDOMEN:  Soft, nontender.  Decreased in distention.  Incision clean, dry,  and intact with staples removed.  EXTREMITIES:  No edema.   DISCHARGE INSTRUCTIONS:  The patient is instructed to follow a soft diet for  2 weeks.  Otherwise discharge instructions the same from previous discharge  summary.  The patient will  continue the same medications.  He has an  appointment to see Dr. Darrick Penna on July 08, 2006.  The patient to call Dr.  Johna Sheriff for an appointment in 2 weeks.  The patient instructed to call the  office if he experiences any further problems.      Constance Holster, PA      Janetta Hora. Fields, MD  Electronically Signed   JMW/MEDQ  D:  06/16/2006  T:  06/17/2006  Job:  [id number]   cc:   Lorne Skeens. Hoxworth, M.D.  Janetta Hora. Darrick Penna, MD

## 2011-03-25 ENCOUNTER — Encounter: Payer: Self-pay | Admitting: Cardiology

## 2011-03-26 ENCOUNTER — Encounter: Payer: Self-pay | Admitting: Cardiology

## 2011-03-26 ENCOUNTER — Ambulatory Visit (INDEPENDENT_AMBULATORY_CARE_PROVIDER_SITE_OTHER): Payer: Medicare Other | Admitting: Cardiology

## 2011-03-26 VITALS — BP 136/64 | HR 63 | Ht 72.0 in | Wt 147.4 lb

## 2011-03-26 DIAGNOSIS — I6529 Occlusion and stenosis of unspecified carotid artery: Secondary | ICD-10-CM

## 2011-03-26 DIAGNOSIS — I2510 Atherosclerotic heart disease of native coronary artery without angina pectoris: Secondary | ICD-10-CM

## 2011-03-26 DIAGNOSIS — I6521 Occlusion and stenosis of right carotid artery: Secondary | ICD-10-CM

## 2011-03-26 NOTE — Assessment & Plan Note (Signed)
He remains asymptomatic. He is intolerant of aspirin due to gastrointestinal side effects. I made no changes in his therapy today and we'll monitor him for any new symptoms. Will follow up again in one year.

## 2011-03-26 NOTE — Patient Instructions (Signed)
Continue your current medications.  I will see you again in 1 year.   

## 2011-03-26 NOTE — Progress Notes (Signed)
   Luis Guerra Date of Birth: Aug 21, 1921   History of Present Illness: Luis Guerra is seen for her yearly followup today. He denies any symptoms of chest pain, or shortness of breath. He has done very well over this past year. It has been 4 years since he underwent cardiac catheterization and was found to have an occluded second diagonal branch. He had no other obstructive disease.  Current Outpatient Prescriptions on File Prior to Visit  Medication Sig Dispense Refill  . Fluticasone-Salmeterol (ADVAIR DISKUS) 250-50 MCG/DOSE AEPB Inhale 1 puff into the lungs every 12 (twelve) hours.        Marland Kitchen losartan (COZAAR) 50 MG tablet Take 50 mg by mouth daily.        . Multiple Vitamin (MULTIVITAMIN) tablet Take 1 tablet by mouth daily.        . pantoprazole (PROTONIX) 20 MG tablet Take 20 mg by mouth daily.        . ranitidine (ZANTAC) 150 MG tablet Take 150 mg by mouth at bedtime.        . theophylline (THEOPHYLLINE) 300 MG 12 hr tablet Take 300 mg by mouth 2 (two) times daily.        Marland Kitchen DISCONTD: levothyroxine (SYNTHROID, LEVOTHROID) 150 MCG tablet Take 150 mcg by mouth daily.          Allergies  Allergen Reactions  . Aspirin     REACTION: STOMACH UPSET  . Esomeprazole Magnesium   . Niacin   . Penicillins     Past Medical History  Diagnosis Date  . Coronary artery disease     WITH HISTORY OF OCCLUSION OF A DIAGONAL BRANCH  . SBO (small bowel obstruction)   . Hypothyroidism   . Carotid occlusion, right   . COPD (chronic obstructive pulmonary disease)   . Gastritis   . AAA (abdominal aortic aneurysm)   . Hiatal hernia     Past Surgical History  Procedure Date  . Cardiac catheterization 06/08/2007    EF 55%  . Abdominal aortic aneurysm repair   . Cardiovascular stress test 05/03/2006    EF 60%    History  Smoking status  . Former Smoker  . Quit date: 08/30/1957  Smokeless tobacco  . Not on file    History  Alcohol Use No    History reviewed. No pertinent family  history.  Review of Systems: The review of systems is positive for intermittent diarrhea twice a week. He takes p.r.n. Imodium.  All other systems were reviewed and are negative.  Physical Exam: BP 136/64  Pulse 63  Ht 6' (1.829 m)  Wt 147 lb 6.4 oz (66.86 kg)  BMI 19.99 kg/m2 He is a thin elderly white male in no acute distress. His HEENT exam is unremarkable. He has no JVD or bruits. Lungs are clear. Cardiac exam reveals a very soft systolic ejection murmur at the apex. Abdomen is soft and nontender without masses. He has no edema. Pedal pulses are good. LABORATORY DATA: ECG demonstrates normal sinus rhythm with PACs. He has LVH by voltage. He has ST-T wave abnormality consistent with inferolateral ischemia. This is unchanged from July of 2010.  Assessment / Plan:

## 2011-03-30 ENCOUNTER — Encounter: Payer: Self-pay | Admitting: Cardiology

## 2011-04-13 ENCOUNTER — Encounter (INDEPENDENT_AMBULATORY_CARE_PROVIDER_SITE_OTHER): Payer: Self-pay

## 2011-04-19 ENCOUNTER — Ambulatory Visit (INDEPENDENT_AMBULATORY_CARE_PROVIDER_SITE_OTHER): Payer: Medicare Other | Admitting: Internal Medicine

## 2011-04-19 VITALS — BP 112/72 | HR 72 | Temp 98.1°F | Ht 72.0 in | Wt 148.1 lb

## 2011-04-19 DIAGNOSIS — R1314 Dysphagia, pharyngoesophageal phase: Secondary | ICD-10-CM

## 2011-04-19 DIAGNOSIS — R1310 Dysphagia, unspecified: Secondary | ICD-10-CM

## 2011-04-19 NOTE — Progress Notes (Signed)
Subjective:     Patient ID: Luis Guerra, male   DOB: 09-Jun-1921, 77 y.o.   MRN: [id number]  HPI  Luis Guerra is an 77 yr old male presenting today with c/o diarrhea 3-4 times a week.  Symptoms for about a year.  He has tried Imodium and Kaopectate on a prn basis.  He usually has a BM daily.   When he has diarrhea it will be thick water. No bright red rectal bleeding or melena.  He has increased fiber. His appetite for the most part is good.  He does have early satiety. He has had a small amt of weight loss.  He has lost about 5 pounds since November.  He also c/o dysphagia.  Pills are hard to go down.  He c/o solid food dypshagia.  His diet has not changed.  EGD/ED and colonoscopy 07/22/2010: Esophageal stricture involving distal 23-cm esophagus just was dilated with balloon to 15.5 mm.  Initial diameter was estimated to be 9-10 mm. A 2 cm size sliding hital hernia. Small submucosal lesion at antrum suspicious for leiomyoma and left alone.Patient anastomosis with normal afferent and efferent loops.  Colonoscopy: Pancolonic diverticulosis. Two small polyps ablated via cold biopsy from hepatic flexure.  External hemorrhoids. Biopsy: One tubular  Adenoma: no high grade dysplasia or malignancy, other hyperplastic.  Review of Systems      Objective:   Physical Exam Alert and oriented. Skin warm and dry. Upper and lower dentures.. Sclera anicteric, conjunctivae is pink. Thyroid not enlarged. No cervical lymphadenopathy. Lungs clear. Heart regular rate and rhythm.  Abdomen is soft. Bowel sounds are positive. No hepatomegaly. No abdominal masses felt. No tenderness. Able to feel mesh . No edema to lower extremities. Patient is alert and oriented.     Assessment:    Diarrhea which has been ongoing for about a year. It occurs about 3-4 times a week. Dysphagia.    Plan:    Imodium once a day. Increase fiber. Will schedule and DG Esophagus (Pill study).  If normal will discuss with Dr. Karilyn Cota.

## 2011-04-20 ENCOUNTER — Ambulatory Visit (HOSPITAL_COMMUNITY)
Admission: RE | Admit: 2011-04-20 | Discharge: 2011-04-20 | Disposition: A | Discharge status: Home / Self Care | Payer: Medicare Other | Source: Ambulatory Visit | Attending: Internal Medicine | Admitting: Internal Medicine

## 2011-04-21 ENCOUNTER — Telehealth (INDEPENDENT_AMBULATORY_CARE_PROVIDER_SITE_OTHER): Payer: Self-pay | Admitting: Internal Medicine

## 2011-04-21 NOTE — Telephone Encounter (Signed)
Addended by: Simone Curia on: 04/21/2011 04:07 PM   Modules accepted: Orders

## 2011-04-21 NOTE — Telephone Encounter (Signed)
Spoke with patient concerning his loose stools.  Stools have firmed up now. No diarrhea. He is having 2 stools a day. Not taking the Imodium.

## 2011-04-21 NOTE — Telephone Encounter (Signed)
Per Terri patient need EGD/ED  EGD/ED sch'd 05/26/11 @ 3:00 (2:00), instructions mailed

## 2011-04-21 NOTE — Telephone Encounter (Signed)
I spoke with patient about the results of his pill study. He will need a repeat EGD/ED    Needs an EGD/ED

## 2011-05-20 LAB — BASIC METABOLIC PANEL
BUN: 52 — ABNORMAL HIGH
BUN: 68 — ABNORMAL HIGH
CO2: 11 — ABNORMAL LOW
CO2: 13 — ABNORMAL LOW
CO2: 13 — ABNORMAL LOW
CO2: 15 — ABNORMAL LOW
Calcium: 9.2
Chloride: 102
Chloride: 108
Creatinine, Ser: 3.55 — ABNORMAL HIGH
GFR calc Af Amer: 20 — ABNORMAL LOW
GFR calc Af Amer: 27 — ABNORMAL LOW
GFR calc non Af Amer: 42 — ABNORMAL LOW
Glucose, Bld: 103 — ABNORMAL HIGH
Glucose, Bld: 106 — ABNORMAL HIGH
Glucose, Bld: 176 — ABNORMAL HIGH
Glucose, Bld: 90
Glucose, Bld: 92
Potassium: 3.6
Potassium: 4.1
Potassium: 4.8
Sodium: 130 — ABNORMAL LOW
Sodium: 130 — ABNORMAL LOW

## 2011-05-20 LAB — DIFFERENTIAL
Basophils Absolute: 0
Basophils Absolute: 0
Basophils Absolute: 0.1
Basophils Relative: 0
Basophils Relative: 0
Basophils Relative: 0
Eosinophils Absolute: 0
Eosinophils Absolute: 0.1
Eosinophils Absolute: 0.2
Eosinophils Relative: 0
Eosinophils Relative: 0
Eosinophils Relative: 1
Eosinophils Relative: 3
Lymphocytes Relative: 15
Lymphocytes Relative: 9 — ABNORMAL LOW
Lymphs Abs: 0.8
Lymphs Abs: 1.1
Monocytes Absolute: 1.2 — ABNORMAL HIGH
Neutro Abs: 10.8 — ABNORMAL HIGH
Neutrophils Relative %: 82 — ABNORMAL HIGH

## 2011-05-20 LAB — URINE MICROSCOPIC-ADD ON

## 2011-05-20 LAB — CBC
HCT: 33.8 — ABNORMAL LOW
HCT: 41.3
HCT: 53.2 — ABNORMAL HIGH
Hemoglobin: 13.6
MCHC: 32.4
MCHC: 33
MCV: 82.6
MCV: 83.8
MCV: 83.8
Platelets: 194
Platelets: 330
Platelets: 355
RDW: 16.6 — ABNORMAL HIGH
RDW: 16.8 — ABNORMAL HIGH
RDW: 16.8 — ABNORMAL HIGH
WBC: 13.3 — ABNORMAL HIGH
WBC: 14.1 — ABNORMAL HIGH

## 2011-05-20 LAB — URINALYSIS, ROUTINE W REFLEX MICROSCOPIC
Bilirubin Urine: NEGATIVE
Ketones, ur: NEGATIVE
Specific Gravity, Urine: 1.02
Urobilinogen, UA: 0.2

## 2011-05-20 LAB — BLOOD GAS, ARTERIAL
Acid-base deficit: 15.3 — ABNORMAL HIGH
O2 Saturation: 97.2
pO2, Arterial: 114 — ABNORMAL HIGH

## 2011-05-20 LAB — CULTURE, BLOOD (ROUTINE X 2): Culture: NO GROWTH

## 2011-05-20 LAB — URINE CULTURE: Special Requests: POSITIVE

## 2011-05-21 LAB — BASIC METABOLIC PANEL
BUN: 37 — ABNORMAL HIGH
BUN: 41 — ABNORMAL HIGH
BUN: 77 — ABNORMAL HIGH
CO2: 26
Calcium: 10.3
Calcium: 8.8
Calcium: 8.8
Calcium: 8.8
Chloride: 116 — ABNORMAL HIGH
Creatinine, Ser: 4.75 — ABNORMAL HIGH
Creatinine, Ser: 1.35
Creatinine, Ser: 1.48
Creatinine, Ser: 2.07 — ABNORMAL HIGH
Creatinine, Ser: 4 — ABNORMAL HIGH
GFR calc Af Amer: 14 — ABNORMAL LOW
GFR calc Af Amer: 43 — ABNORMAL LOW
GFR calc non Af Amer: 12 — ABNORMAL LOW
GFR calc non Af Amer: 14 — ABNORMAL LOW
GFR calc non Af Amer: 31 — ABNORMAL LOW
GFR calc non Af Amer: 36 — ABNORMAL LOW
GFR calc non Af Amer: 45 — ABNORMAL LOW
Glucose, Bld: 115 — ABNORMAL HIGH
Glucose, Bld: 129 — ABNORMAL HIGH
Glucose, Bld: 153 — ABNORMAL HIGH
Glucose, Bld: 163 — ABNORMAL HIGH
Potassium: 3.3 — ABNORMAL LOW
Potassium: 3.7
Potassium: 4
Sodium: 143

## 2011-05-21 LAB — DIFFERENTIAL
Basophils Absolute: 0
Basophils Relative: 0
Eosinophils Absolute: 0
Eosinophils Relative: 1
Lymphocytes Relative: 11 — ABNORMAL LOW
Lymphs Abs: 1.3
Lymphs Abs: 1
Monocytes Absolute: 1.6 — ABNORMAL HIGH
Monocytes Relative: 2 — ABNORMAL LOW
Monocytes Relative: 17 — ABNORMAL HIGH
Neutro Abs: 16.7 — ABNORMAL HIGH
Neutrophils Relative %: 91 — ABNORMAL HIGH

## 2011-05-21 LAB — CBC
HCT: 44.3
MCHC: 33.6
MCHC: 33.6
MCV: 81.9
Platelets: 341
Platelets: 397
Platelets: 447 — ABNORMAL HIGH
RBC: 6.42 — ABNORMAL HIGH
RDW: 16.3 — ABNORMAL HIGH
RDW: 16.5 — ABNORMAL HIGH
WBC: 18.3 — ABNORMAL HIGH
WBC: 9.2

## 2011-05-21 LAB — PHOSPHORUS: Phosphorus: 2.5

## 2011-05-21 LAB — COMPREHENSIVE METABOLIC PANEL
AST: 17
Albumin: 3.3 — ABNORMAL LOW
Calcium: 8.6
Creatinine, Ser: 2.59 — ABNORMAL HIGH
GFR calc Af Amer: 29 — ABNORMAL LOW

## 2011-05-21 LAB — CHOLESTEROL, TOTAL: Cholesterol: 82

## 2011-05-21 LAB — MAGNESIUM: Magnesium: 2.4

## 2011-05-21 LAB — SODIUM, URINE, RANDOM: Sodium, Ur: <10

## 2011-05-24 LAB — COMPREHENSIVE METABOLIC PANEL
AST: 47 — ABNORMAL HIGH
Albumin: 2.1 — ABNORMAL LOW
Albumin: 3.1 — ABNORMAL LOW
BUN: 22
BUN: 46 — ABNORMAL HIGH
CO2: 25
Calcium: 8.1 — ABNORMAL LOW
Calcium: 8.7
Calcium: 9.1
Chloride: 101
Creatinine, Ser: 1.02
Creatinine, Ser: 1.37
Creatinine, Ser: 1.38
GFR calc Af Amer: 59 — ABNORMAL LOW
GFR calc Af Amer: 60 — ABNORMAL LOW
GFR calc non Af Amer: 50 — ABNORMAL LOW
Glucose, Bld: 100 — ABNORMAL HIGH
Sodium: 130 — ABNORMAL LOW
Total Protein: 5.1 — ABNORMAL LOW

## 2011-05-24 LAB — CBC
HCT: 37.5 — ABNORMAL LOW
Hemoglobin: 14.3
MCHC: 32.8
MCHC: 33
MCHC: 33.7
MCV: 82.7
MCV: 83.2
MCV: 83.3
Platelets: 200
Platelets: 264
RBC: 5.2
RDW: 16.8 — ABNORMAL HIGH
WBC: 13.8 — ABNORMAL HIGH

## 2011-05-24 LAB — MAGNESIUM: Magnesium: 1.9

## 2011-05-24 LAB — BASIC METABOLIC PANEL
BUN: 39 — ABNORMAL HIGH
BUN: 51 — ABNORMAL HIGH
CO2: 20
CO2: 24
Calcium: 8.2 — ABNORMAL LOW
Calcium: 8.3 — ABNORMAL LOW
Calcium: 9.3
Chloride: 106
Creatinine, Ser: 1.22
GFR calc Af Amer: 48 — ABNORMAL LOW
GFR calc Af Amer: >60
GFR calc non Af Amer: 41 — ABNORMAL LOW
GFR calc non Af Amer: >60
Glucose, Bld: 110 — ABNORMAL HIGH
Glucose, Bld: 119 — ABNORMAL HIGH
Glucose, Bld: 125 — ABNORMAL HIGH
Potassium: 4.3
Potassium: 4.4
Sodium: 134 — ABNORMAL LOW
Sodium: 137

## 2011-05-24 LAB — DIFFERENTIAL
Basophils Relative: 0
Eosinophils Relative: 5
Lymphocytes Relative: 9 — ABNORMAL LOW
Lymphs Abs: 1.2
Monocytes Absolute: 1
Monocytes Absolute: 1.1 — ABNORMAL HIGH
Monocytes Relative: 7
Neutro Abs: 11.5 — ABNORMAL HIGH

## 2011-05-24 LAB — PREALBUMIN: Prealbumin: 17.5 — ABNORMAL LOW

## 2011-05-24 LAB — CHOLESTEROL, TOTAL: Cholesterol: 72

## 2011-05-24 LAB — PHOSPHORUS: Phosphorus: 3.1

## 2011-05-25 MED ORDER — SODIUM CHLORIDE 0.45 % IV SOLN
Freq: Once | INTRAVENOUS | Status: AC
  Administered 2011-05-26: 08:00:00 via INTRAVENOUS

## 2011-05-26 ENCOUNTER — Ambulatory Visit (HOSPITAL_COMMUNITY)
Admission: RE | Admit: 2011-05-26 | Discharge: 2011-05-26 | Disposition: A | Discharge status: Home / Self Care | Payer: Medicare Other | Source: Ambulatory Visit | Attending: Internal Medicine | Admitting: Internal Medicine

## 2011-05-26 ENCOUNTER — Encounter (HOSPITAL_COMMUNITY)
Admission: RE | Disposition: A | Discharge status: Home / Self Care | Payer: Self-pay | Source: Ambulatory Visit | Attending: Internal Medicine

## 2011-05-26 ENCOUNTER — Encounter (HOSPITAL_COMMUNITY): Payer: Self-pay | Admitting: *Deleted

## 2011-05-26 ENCOUNTER — Other Ambulatory Visit (INDEPENDENT_AMBULATORY_CARE_PROVIDER_SITE_OTHER): Payer: Self-pay | Admitting: Internal Medicine

## 2011-05-26 DIAGNOSIS — R1310 Dysphagia, unspecified: Secondary | ICD-10-CM

## 2011-05-26 DIAGNOSIS — J449 Chronic obstructive pulmonary disease, unspecified: Secondary | ICD-10-CM | POA: Insufficient documentation

## 2011-05-26 DIAGNOSIS — K222 Esophageal obstruction: Secondary | ICD-10-CM

## 2011-05-26 DIAGNOSIS — Z7982 Long term (current) use of aspirin: Secondary | ICD-10-CM | POA: Insufficient documentation

## 2011-05-26 DIAGNOSIS — C169 Malignant neoplasm of stomach, unspecified: Secondary | ICD-10-CM | POA: Insufficient documentation

## 2011-05-26 DIAGNOSIS — K259 Gastric ulcer, unspecified as acute or chronic, without hemorrhage or perforation: Secondary | ICD-10-CM

## 2011-05-26 DIAGNOSIS — K2940 Chronic atrophic gastritis without bleeding: Secondary | ICD-10-CM | POA: Insufficient documentation

## 2011-05-26 DIAGNOSIS — J4489 Other specified chronic obstructive pulmonary disease: Secondary | ICD-10-CM | POA: Insufficient documentation

## 2011-05-26 HISTORY — PX: ESOPHAGOGASTRODUODENOSCOPY: SHX5428

## 2011-05-26 HISTORY — PX: BALLOON DILATION: SHX5330

## 2011-05-26 SURGERY — EGD (ESOPHAGOGASTRODUODENOSCOPY)
Anesthesia: Moderate Sedation

## 2011-05-26 MED ORDER — MIDAZOLAM HCL 5 MG/5ML IJ SOLN
INTRAMUSCULAR | Status: DC | PRN
  Administered 2011-05-26: 2 mg via INTRAVENOUS
  Administered 2011-05-26: 1 mg via INTRAVENOUS

## 2011-05-26 MED ORDER — BUTAMBEN-TETRACAINE-BENZOCAINE 2-2-14 % EX AERO
INHALATION_SPRAY | CUTANEOUS | Status: DC | PRN
  Administered 2011-05-26: 2 via TOPICAL

## 2011-05-26 MED ORDER — MEPERIDINE HCL 50 MG/ML IJ SOLN
INTRAMUSCULAR | Status: AC
  Filled 2011-05-26: qty 1

## 2011-05-26 MED ORDER — MIDAZOLAM HCL 5 MG/5ML IJ SOLN
INTRAMUSCULAR | Status: AC
  Filled 2011-05-26: qty 10

## 2011-05-26 NOTE — H&P (Signed)
Luis Guerra is an 77 y.o. male.   Chief Complaint: Patient is here for EGD and ED. HPI: Patient is a 77 year old Caucasian male who has chronic GERD complicated by esophageal stricture. The stricture was dilated in November 2011 with the symptomatic relief. The stricture was dilated only to 15 mm. He's been maintained on PPI and reports no heartburn. He started to experience dysphagia primarily to solids about 4 weeks ago. He has good appetite and has not lost any weight since his abdominal surgeries few years ago.   Past Medical History  Diagnosis Date  . Coronary artery disease     WITH HISTORY OF OCCLUSION OF A DIAGONAL BRANCH  . SBO (small bowel obstruction)   . Hypothyroidism   . Carotid occlusion, right   . COPD (chronic obstructive pulmonary disease)   . Gastritis   . AAA (abdominal aortic aneurysm)   . Hiatal hernia   . Weight loss   . Dysphagia     Past Surgical History  Procedure Date  . Cardiac catheterization 06/08/2007    EF 55%  . Abdominal aortic aneurysm repair   . Cardiovascular stress test 05/03/2006    EF 60%  . Upper gastrointestinal endoscopy 07/13/06  . Upper gastrointestinal endoscopy 02/19/05  . Upper gastrointestinal endoscopy 07/22/2010    EGD ED  . Colonoscopy 07/22/2010  . Hernia repair     History reviewed. No pertinent family history. Social History:  reports that he quit smoking about 53 years ago. He does not have any smokeless tobacco history on file. He reports that he does not drink alcohol or use illicit drugs.  Allergies:  Allergies  Allergen Reactions  . Aspirin     REACTION: STOMACH UPSET  . Esomeprazole Magnesium Diarrhea  . Niacin Swelling  . Penicillins Swelling    Medications Prior to Admission  Medication Dose Route Frequency Provider Last Rate Last Dose  . 0.45 % sodium chloride infusion   Intravenous Once Malissa Hippo, MD 20 mL/hr at 05/26/11 0753    . meperidine (DEMEROL) 50 MG/ML injection           . midazolam  (VERSED) 5 MG/5ML injection            Medications Prior to Admission  Medication Sig Dispense Refill  . acetaminophen (TYLENOL) 500 MG tablet Take 1,000 mg by mouth every 6 (six) hours as needed. For pain       . Fluticasone-Salmeterol (ADVAIR DISKUS) 250-50 MCG/DOSE AEPB Inhale 1 puff into the lungs every 12 (twelve) hours.        Marland Kitchen levothyroxine (SYNTHROID, LEVOTHROID) 125 MCG tablet Take 125 mcg by mouth daily.        Marland Kitchen losartan (COZAAR) 50 MG tablet Take 50 mg by mouth daily.        . Multiple Vitamin (MULTIVITAMIN) tablet Take 1 tablet by mouth daily.        Marland Kitchen omeprazole (PRILOSEC) 20 MG capsule Take 20 mg by mouth daily.        . ranitidine (ZANTAC) 150 MG tablet Take 150 mg by mouth at bedtime.        . theophylline (THEOPHYLLINE) 300 MG 12 hr tablet Take 300 mg by mouth 2 (two) times daily.        Marland Kitchen losartan (COZAAR) 50 MG tablet Take 50 mg by mouth daily.       . pantoprazole (PROTONIX) 20 MG tablet Take 20 mg by mouth daily.  No results found for this or any previous visit (from the past 48 hour(s)). No results found.  Review of Systems  Constitutional: Negative for weight loss.    Blood pressure 147/90, pulse 89, temperature 97.7 F (36.5 C), temperature source Oral, resp. rate 18, height 6' (1.829 m), weight 142 lb (64.411 kg), SpO2 96.00%. Physical Exam  Constitutional: He appears well-developed.       Pain male in NAD  HENT:  Mouth/Throat: Oropharynx is clear and moist.  Eyes: Conjunctivae are normal. No scleral icterus.  Neck: No thyromegaly present.  Cardiovascular: Normal rate, regular rhythm and normal heart sounds.   No murmur heard. Respiratory: Effort normal and breath sounds normal.  GI: He exhibits no distension and no mass. There is no tenderness.       Upper midline scar. On palpation abdominal wall has irregularity felt to be secondary to mesh  Musculoskeletal: He exhibits no edema.  Lymphadenopathy:    He has no cervical adenopathy.    Neurological: He is alert.  Skin: Skin is warm and dry.     Assessment/Plan Solid food dysphagia  Known esophageal stricture last dilated November 2011 to 15 mm. Esophagogastroduodenoscopy with esophageal dilation  REHMAN,NAJEEB U 05/26/2011, 8:30 AM

## 2011-05-26 NOTE — Op Note (Signed)
EGD PROCEDURE REPORT  PATIENT:  Luis Guerra  MR#:  [id number] Birthdate:  Mar 15, 1921, 77 y.o., male Endoscopist:  Dr. Malissa Hippo, MD Referred By:  Dr. Dwana Melena M.D. Procedure Date: 05/26/2011  Procedure:   EGD with balloon dilation of esophageal stricture.  Indications:  Patient is 77 year old Caucasian male with complicated GI history who presents with solid food dysphagia. He has known esophageal stricture which was last dilated in November 2011 to 15 mm with a balloon. He states this intervention helped him until about 4 weeks ago.            Informed Consent:  Procedure and risks were reviewed with the patient and informed consent was obtained. Medications:   Versed 3 mg IV Cetacaine spray topically for oropharyngeal anesthesia  Description of procedure:  The endoscope was introduced through the mouth and advanced to the second portion of the duodenum without difficulty or limitations. The mucosal surfaces were surveyed very carefully during advancement of the scope and upon withdrawal.  Findings:  Esophagus:  Scant amount of food debris noted at esophageal body. Esophageal stricture involving distal 2 cm of the esophagus. Some resistance noted as the Q scope was advanced across it. GEJ:  Dirty 8 cm  Stomach:  Large stomach with he is fluid and food debris. Over 400 mils was suctioned out but stomach was not cleared. Wide open gastrojejunostomy with small anastomotic ulcer. There is 8-10 mm ulcer and on elevated tissue suspicious for ulcerated leiomyoma. Pyloric channel was patent. There is fundus and cardia were normal Duodenum:  Bulbar and post bulbar mucosa normal. Him dilation to post bulbar duodenum. Small bowel: Both afferent and efferent loops were examined and no abnormality noted other than small diverticulum involving the afferent loop.  Therapeutic/Diagnostic Maneuvers Performed:  Esophageal stricture was dilated using wire-guided balloon initially to 15 mm and  subsequently to 16.5 mm. Biopsy obtained from antral ulcer for routine histology.  Complications:  None  Impression: Multiple findings. Distal esophageal stricture was dilated with a balloon to 16.5 mm. Dilated stomach with fluid and food debris; over 400 ml was suctioned out. Altered anatomy secondary to gastrojejunostomy with small anastomotic ulcer. Antral ulcer sitting on elevated tissue very suspicious for ulcerated leiomyoma. These note this area was biopsied back in 2007 [place] and revealed chronic gastritis. On EGD of November 2011 this lesion appeared to be submucosal lesion possibly leiomyoma. Suspect he also has gastroparesis.  Recommendations:  Gastroparesis  Diet. Stop aspirin. Continue PPI. I will be contacting patient or a family member with biopsy results and further recommendations.  Maanvi Lecompte U  05/26/2011  9:11 AM  CC: Dr. Dwana Melena, MD & Dr. Bonnetta Barry ref. provider found

## 2011-06-02 ENCOUNTER — Ambulatory Visit (INDEPENDENT_AMBULATORY_CARE_PROVIDER_SITE_OTHER): Payer: Medicare Other | Admitting: Internal Medicine

## 2011-06-02 ENCOUNTER — Encounter (INDEPENDENT_AMBULATORY_CARE_PROVIDER_SITE_OTHER): Payer: Self-pay | Admitting: Internal Medicine

## 2011-06-02 VITALS — BP 112/74 | HR 72 | Temp 97.0°F | Resp 14 | Ht 72.0 in | Wt 149.0 lb

## 2011-06-02 DIAGNOSIS — C169 Malignant neoplasm of stomach, unspecified: Secondary | ICD-10-CM

## 2011-06-02 NOTE — Progress Notes (Signed)
I asked for Luis Guerra and his wife to come to the office go with the biopsy results. He had EGD with EGD last week. He also had his esophagus dilated and he can swallow much better. He has not experienced any abdominal pain bloating or nausea. 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 Dr. Nedra Hai for multiple studies the biopsy specimen and it is not a GIST which are thought was going to be the case. I recommended endoscopic ultrasound at [facility]; if this lesion is superficial it could be removed endoscopically in which case I would ask Dr. Gwinda Passe to do so.

## 2011-06-03 ENCOUNTER — Encounter (HOSPITAL_COMMUNITY): Payer: Self-pay | Admitting: Internal Medicine

## 2011-06-04 ENCOUNTER — Encounter (INDEPENDENT_AMBULATORY_CARE_PROVIDER_SITE_OTHER): Payer: Self-pay | Admitting: *Deleted

## 2011-06-10 LAB — CBC
HCT: 34.1 — ABNORMAL LOW
HCT: 37.1 — ABNORMAL LOW
HCT: 40.9
Hemoglobin: 11.2 — ABNORMAL LOW
Hemoglobin: 13.5
MCHC: 32.9
MCHC: 33.1
MCHC: 33.5
MCV: 88.3
Platelets: 213
Platelets: 240
RBC: 4.47
RDW: 14.2 — ABNORMAL HIGH
RDW: 14.3 — ABNORMAL HIGH
RDW: 14.3 — ABNORMAL HIGH
RDW: 14.4 — ABNORMAL HIGH

## 2011-06-10 LAB — DIFFERENTIAL
Basophils Absolute: 0
Eosinophils Relative: 7 — ABNORMAL HIGH
Lymphocytes Relative: 11 — ABNORMAL LOW
Monocytes Absolute: 0.9 — ABNORMAL HIGH

## 2011-06-10 LAB — I-STAT 8, (EC8 V) (CONVERTED LAB)
Acid-base deficit: 3 — ABNORMAL HIGH
Bicarbonate: 22.1
Potassium: 3.5
Sodium: 139
TCO2: 23
pH, Ven: 7.36 — ABNORMAL HIGH

## 2011-06-10 LAB — CK TOTAL AND CKMB (NOT AT ARMC)
CK, MB: 1.7
Relative Index: INVALID

## 2011-06-10 LAB — POCT CARDIAC MARKERS
CKMB, poc: 2.1
Myoglobin, poc: 113
Myoglobin, poc: 79.4
Troponin i, poc: <0.05
Troponin i, poc: <0.05
Troponin i, poc: <0.05

## 2011-06-10 LAB — COMPREHENSIVE METABOLIC PANEL
ALT: 19
AST: 26
Albumin: 3 — ABNORMAL LOW
CO2: 23
Calcium: 8.8
GFR calc Af Amer: >60
Sodium: 134 — ABNORMAL LOW
Total Protein: 6.3

## 2011-06-10 LAB — HEPARIN LEVEL (UNFRACTIONATED): Heparin Unfractionated: 0.44

## 2011-06-10 LAB — BASIC METABOLIC PANEL
CO2: 27
GFR calc non Af Amer: >60
Glucose, Bld: 116 — ABNORMAL HIGH
Potassium: 3.4 — ABNORMAL LOW
Sodium: 140

## 2011-06-10 LAB — CARDIAC PANEL(CRET KIN+CKTOT+MB+TROPI)
CK, MB: 1.5
Relative Index: INVALID
Total CK: 34
Troponin I: 0.02
Troponin I: 0.03

## 2011-06-10 LAB — LIPID PANEL: VLDL: 8

## 2011-06-10 LAB — TROPONIN I: Troponin I: 0.02

## 2011-06-10 LAB — APTT: aPTT: 126 — ABNORMAL HIGH

## 2011-06-30 ENCOUNTER — Telehealth (INDEPENDENT_AMBULATORY_CARE_PROVIDER_SITE_OTHER): Payer: Self-pay

## 2011-06-30 NOTE — Telephone Encounter (Signed)
Called patient and left voice message to call our office, patient need's his appointment date and time with Dr. Johna Sheriff.

## 2011-07-16 ENCOUNTER — Ambulatory Visit (INDEPENDENT_AMBULATORY_CARE_PROVIDER_SITE_OTHER): Payer: Medicare Other | Admitting: General Surgery

## 2011-07-16 ENCOUNTER — Encounter (INDEPENDENT_AMBULATORY_CARE_PROVIDER_SITE_OTHER): Payer: Self-pay | Admitting: General Surgery

## 2011-07-16 VITALS — BP 124/72 | HR 60 | Temp 97.2°F | Resp 18 | Ht 72.0 in | Wt 146.2 lb

## 2011-07-16 DIAGNOSIS — C169 Malignant neoplasm of stomach, unspecified: Secondary | ICD-10-CM

## 2011-07-16 NOTE — Progress Notes (Signed)
History: Patient is well known to me due to previous ventral hernia repair and laparoscopic lysis of adhesions subsequent to this. I had been called by Dr. 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 It was thought surgery may be necessary. On discussing with the patient is wife today however he was able to have what they describe as entirely successful endoscopic removal of the small lesion at [facility].  I will discuss this with Dr. Loreta Ave obtain the records from [facility] to make sure that no surgical intervention is indicated but it certainly appears not. This is grade and is for the patient. Are made available as needed.

## 2011-09-23 DIAGNOSIS — I739 Peripheral vascular disease, unspecified: Secondary | ICD-10-CM | POA: Diagnosis not present

## 2011-12-02 DIAGNOSIS — I739 Peripheral vascular disease, unspecified: Secondary | ICD-10-CM | POA: Diagnosis not present

## 2011-12-07 ENCOUNTER — Encounter: Payer: Self-pay | Admitting: Orthopedic Surgery

## 2011-12-07 ENCOUNTER — Ambulatory Visit (INDEPENDENT_AMBULATORY_CARE_PROVIDER_SITE_OTHER): Payer: Medicare Other | Admitting: Orthopedic Surgery

## 2011-12-07 VITALS — Ht 72.0 in | Wt 145.0 lb

## 2011-12-07 DIAGNOSIS — M25569 Pain in unspecified knee: Secondary | ICD-10-CM

## 2011-12-07 DIAGNOSIS — M1710 Unilateral primary osteoarthritis, unspecified knee: Secondary | ICD-10-CM

## 2011-12-07 NOTE — Patient Instructions (Signed)
You have received a steroid shot. 15% of patients experience increased pain at the injection site with in the next 24 hours. This is best treated with ice and tylenol extra strength 2 tabs every 8 hours. If you are still having pain please call the office.    

## 2011-12-07 NOTE — Progress Notes (Signed)
Patient ID: Luis Guerra, male   DOB: February 26, 1921, 78 y.o.   MRN: [id number] Chief Complaint  Patient presents with  . Follow-up    Recheck on bilateral knees with injections.     The patient requests to injections.  The last injection was December of 2011 and the patient did well  Knee  Injection Procedure Note  Pre-operative Diagnosis: left knee oa  Post-operative Diagnosis: same  Indications: pain  Anesthesia: ethyl chloride   Procedure Details   Verbal consent was obtained for the procedure. Time out was completed.The joint was prepped with alcohol, followed by  Ethyl chloride spray and A 20 gauge needle was inserted into the knee via lateral approach; 4ml 1% lidocaine and 1 ml of depomedrol  was then injected into the joint . The needle was removed and the area cleansed and dressed.  Complications:  None; patient tolerated the procedure well. Knee  Injection Procedure Note  Pre-operative Diagnosis: right knee oa  Post-operative Diagnosis: same  Indications: pain  Anesthesia: ethyl chloride   Procedure Details   Verbal consent was obtained for the procedure. Time out was completed.The joint was prepped with alcohol, followed by  Ethyl chloride spray and A 20 gauge needle was inserted into the knee via lateral approach; 4ml 1% lidocaine and 1 ml of depomedrol  was then injected into the joint . The needle was removed and the area cleansed and dressed.  Complications:  None; patient tolerated the procedure well.

## 2012-02-10 DIAGNOSIS — I739 Peripheral vascular disease, unspecified: Secondary | ICD-10-CM | POA: Diagnosis not present

## 2012-03-10 DIAGNOSIS — C159 Malignant neoplasm of esophagus, unspecified: Secondary | ICD-10-CM | POA: Diagnosis not present

## 2012-03-10 DIAGNOSIS — H918X9 Other specified hearing loss, unspecified ear: Secondary | ICD-10-CM | POA: Diagnosis not present

## 2012-04-07 DIAGNOSIS — R197 Diarrhea, unspecified: Secondary | ICD-10-CM | POA: Diagnosis not present

## 2012-04-07 DIAGNOSIS — J449 Chronic obstructive pulmonary disease, unspecified: Secondary | ICD-10-CM | POA: Diagnosis not present

## 2012-04-07 DIAGNOSIS — I10 Essential (primary) hypertension: Secondary | ICD-10-CM | POA: Diagnosis not present

## 2012-04-07 DIAGNOSIS — E785 Hyperlipidemia, unspecified: Secondary | ICD-10-CM | POA: Diagnosis not present

## 2012-04-07 DIAGNOSIS — E039 Hypothyroidism, unspecified: Secondary | ICD-10-CM | POA: Diagnosis not present

## 2012-04-27 DIAGNOSIS — I739 Peripheral vascular disease, unspecified: Secondary | ICD-10-CM | POA: Diagnosis not present

## 2012-05-16 ENCOUNTER — Encounter: Payer: Self-pay | Admitting: Orthopedic Surgery

## 2012-05-16 ENCOUNTER — Ambulatory Visit (INDEPENDENT_AMBULATORY_CARE_PROVIDER_SITE_OTHER): Payer: Medicare Other | Admitting: Orthopedic Surgery

## 2012-05-16 VITALS — BP 110/50 | Ht 72.0 in | Wt 142.0 lb

## 2012-05-16 DIAGNOSIS — M25569 Pain in unspecified knee: Secondary | ICD-10-CM | POA: Diagnosis not present

## 2012-05-16 DIAGNOSIS — IMO0002 Reserved for concepts with insufficient information to code with codable children: Secondary | ICD-10-CM

## 2012-05-16 DIAGNOSIS — M1710 Unilateral primary osteoarthritis, unspecified knee: Secondary | ICD-10-CM

## 2012-05-16 DIAGNOSIS — M179 Osteoarthritis of knee, unspecified: Secondary | ICD-10-CM

## 2012-05-16 NOTE — Progress Notes (Signed)
Patient ID: Luis Guerra, male   DOB: 1921/01/10, 78 y.o.   MRN: [id number] Chief Complaint  Patient presents with  . Knee Pain    bilateral knee pain, requests injections    BP 110/50  Ht 6' (1.829 m)  Wt 142 lb (64.411 kg)  BMI 19.26 kg/m2  Knee  Injection Procedure Note  Pre-operative Diagnosis: left knee oa  Post-operative Diagnosis: same  Indications: pain  Anesthesia: ethyl chloride   Procedure Details   Verbal consent was obtained for the procedure. Time out was completed.The joint was prepped with alcohol, followed by  Ethyl chloride spray and A 20 gauge needle was inserted into the knee via lateral approach; 4ml 1% lidocaine and 1 ml of depomedrol  was then injected into the joint . The needle was removed and the area cleansed and dressed.  Complications:  None; patient tolerated the procedure well.  Knee  Injection Procedure Note  Pre-operative Diagnosis: right knee oa  Post-operative Diagnosis: same  Indications: pain  Anesthesia: ethyl chloride   Procedure Details   Verbal consent was obtained for the procedure. Time out was completed.The joint was prepped with alcohol, followed by  Ethyl chloride spray and A 20 gauge needle was inserted into the knee via lateral approach; 4ml 1% lidocaine and 1 ml of depomedrol  was then injected into the joint . The needle was removed and the area cleansed and dressed.  Complications:  None; patient tolerated the procedure well.

## 2012-05-16 NOTE — Patient Instructions (Signed)
You have received a steroid shot. 15% of patients experience increased pain at the injection site with in the next 24 hours. This is best treated with ice and tylenol extra strength 2 tabs every 8 hours. If you are still having pain please call the office.    

## 2012-05-25 DIAGNOSIS — Z23 Encounter for immunization: Secondary | ICD-10-CM | POA: Diagnosis not present

## 2012-07-04 ENCOUNTER — Other Ambulatory Visit (HOSPITAL_COMMUNITY): Payer: Self-pay | Admitting: Internal Medicine

## 2012-07-04 DIAGNOSIS — R109 Unspecified abdominal pain: Secondary | ICD-10-CM

## 2012-07-06 DIAGNOSIS — I739 Peripheral vascular disease, unspecified: Secondary | ICD-10-CM | POA: Diagnosis not present

## 2012-07-10 ENCOUNTER — Ambulatory Visit (HOSPITAL_COMMUNITY)
Admission: RE | Admit: 2012-07-10 | Discharge: 2012-07-10 | Disposition: A | Discharge status: Home / Self Care | Payer: Medicare Other | Source: Ambulatory Visit | Attending: Internal Medicine | Admitting: Internal Medicine

## 2012-07-10 ENCOUNTER — Other Ambulatory Visit (HOSPITAL_COMMUNITY): Payer: Self-pay | Admitting: Internal Medicine

## 2012-07-10 DIAGNOSIS — R109 Unspecified abdominal pain: Secondary | ICD-10-CM

## 2012-07-10 DIAGNOSIS — R933 Abnormal findings on diagnostic imaging of other parts of digestive tract: Secondary | ICD-10-CM | POA: Insufficient documentation

## 2012-07-10 DIAGNOSIS — R935 Abnormal findings on diagnostic imaging of other abdominal regions, including retroperitoneum: Secondary | ICD-10-CM | POA: Insufficient documentation

## 2012-07-10 LAB — POCT I-STAT, CHEM 8
Calcium, Ion: 1.19 mmol/L (ref 1.13–1.30)
Chloride: 110 mEq/L (ref 96–112)
Glucose, Bld: 84 mg/dL (ref 70–99)
HCT: 45 % (ref 39.0–52.0)
Hemoglobin: 15.3 g/dL (ref 13.0–17.0)

## 2012-07-10 MED ORDER — IOHEXOL 300 MG/ML  SOLN
100.0000 mL | Freq: Once | INTRAMUSCULAR | Status: AC | PRN
  Administered 2012-07-10: 100 mL via INTRAVENOUS

## 2012-07-14 ENCOUNTER — Encounter (INDEPENDENT_AMBULATORY_CARE_PROVIDER_SITE_OTHER): Payer: Self-pay | Admitting: *Deleted

## 2012-07-14 ENCOUNTER — Telehealth (INDEPENDENT_AMBULATORY_CARE_PROVIDER_SITE_OTHER): Payer: Self-pay | Admitting: Internal Medicine

## 2012-07-14 NOTE — Telephone Encounter (Signed)
Advised Terri I would need to approve with Reba. Reba had advised no more patient's for next week, 11/18-19/13 for Dr. Karilyn Cota. Looked at the scheduled and notice Dr. Karilyn Cota had a cancellation for 07/18/12 at 10:30 am. Per Dorene Ar, NP that would be okay.

## 2012-07-14 NOTE — Telephone Encounter (Signed)
Per Dr. Karilyn Cota. Put on his schedule for Monday.

## 2012-07-17 ENCOUNTER — Ambulatory Visit (INDEPENDENT_AMBULATORY_CARE_PROVIDER_SITE_OTHER): Payer: Medicare Other | Admitting: Internal Medicine

## 2012-07-17 ENCOUNTER — Encounter (INDEPENDENT_AMBULATORY_CARE_PROVIDER_SITE_OTHER): Payer: Self-pay | Admitting: Internal Medicine

## 2012-07-17 VITALS — BP 104/68 | HR 72 | Temp 97.4°F | Resp 16 | Ht 72.0 in | Wt 143.8 lb

## 2012-07-17 DIAGNOSIS — R109 Unspecified abdominal pain: Secondary | ICD-10-CM | POA: Insufficient documentation

## 2012-07-17 NOTE — Patient Instructions (Signed)
Further recommendations to made after phone consultation with Dr. Jaclynn Guarneri.

## 2012-07-17 NOTE — Progress Notes (Signed)
Presenting complaint;  Abdominal pain.  Subjective:  Patient is 78 year old Caucasian male who presents for GI evaluation requested by Dr. Catalina Pizza. Patient is well known to me but has not been seen since October 2012. He now presents with 3 months history of bilateral mid abdominal pain. Pain is intermittent. At times he feels 2 kn in his abdomen and other times he feels stabbing pain radiating to his back. He's been experiences pain daily. He states his appetite is normal. His wife states he takes about an hour to eat his supper. Patient states he does not have good teeth and chews his food very slowly. He denies nausea vomiting heartburn or dysphagia. He has intermittent diarrhea easily controlled with Pepto-Bismol. He does complain of excessive flatulence and using Gas-X 2-3 times per day. He denies melena or rectal bleeding. He has lost 10 pounds in the last 6 months. 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 Patient was recently seen by Dr. 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 Gastroduodenal dilation noted to have increased significantly since previous study of 10/22/2007. Patient denies heartburn, nausea regurgitation or cough.  Current Medications: Current Outpatient Prescriptions  Medication Sig Dispense Refill  . acetaminophen (TYLENOL) 500 MG tablet Take 1,000 mg by mouth every 6 (six) hours as needed. For pain       . Fluticasone-Salmeterol (ADVAIR DISKUS) 250-50 MCG/DOSE AEPB Inhale 1 puff into the lungs every 12 (twelve) hours.        Marland Kitchen levothyroxine (SYNTHROID, LEVOTHROID) 125 MCG tablet Take 125 mcg by mouth daily.        Marland Kitchen losartan (COZAAR) 50 MG tablet Take 50 mg by mouth daily.        . Multiple Vitamin (MULTIVITAMIN) tablet  Take 1 tablet by mouth daily.        . pantoprazole (PROTONIX) 20 MG tablet Take 20 mg by mouth daily.      . Probiotic Product (ALIGN) 4 MG CAPS Take by mouth daily.      . ranitidine (ZANTAC) 150 MG tablet Take 150 mg by mouth at bedtime.        . theophylline (THEOPHYLLINE) 300 MG 12 hr tablet Take 300 mg by mouth 2 (two) times daily.         Past medical history; COPD. Hypertension. Chronic GERD with esophageal stricture dilated in November 2011. He was also found to have patent gastrojejunostomy with dilated. He was also noted to have small submucosal lesion at antrum. He had repeat EGD with ED in September 2012. Once again stomach was noted to be quite large containing fluid. He had ulcerated anterior lesion which turned out to be adenocarcinoma on biopsy. This lesion was removed endoscopically at [facility] by Dr. Lanell Matar with clear margins. He did not have any followup. Last colonoscopy was in November 2011 revealing pancolonic diverticulosis and small adenoma was removed. Arthroscopy left knee in June 2010 Remote history of peptic ulcer disease with partial gastrectomy. Had repair of AAA and ventral herniorrhaphy in October 2007 simultaneously. Laparoscopy with lysis of adhesions for small bowel obstruction in March 2009. Cholecystectomy. Lumbar spine surgery years ago.     Objective: Blood pressure 104/68, pulse 72, temperature 97.4 F (36.3 C), temperature source Oral, resp. rate 16, height 6' (1.829 m),  weight 143 lb 12.8 oz (65.227 kg). Patient is alert and in no acute distress. Conjunctiva is pink. Sclera is nonicteric Oropharyngeal mucosa is normal. No neck masses or thyromegaly noted. Cardiac exam with regular rhythm normal S1 and S2. No murmur or gallop noted. Lungs are clear to auscultation. Abdomen is full. Long midline scar noted. Bowel sounds are present. Palpation reveals abdominal wall irregularity in upper abdomen secondary to mesh no definite mass or organomegaly  noted.  No LE edema or clubbing noted.  Labs/studies Results: Current CT from July 25, 2012 reviewed along with prior study of 10/22/2007. He has massive dilation of stomach as well as second and third part of duodenum. Degree of distention is more than it was on 2009 study.   Assessment:  It is unclear if patient's symptoms are secondary to progressive gastroduodenal dilation. He has history of gastric adenocarcinoma which was resected endoscopically 13 months ago without any follow-up. He certainly could have local recurrence. He also has abnormality involving head of pancreas which is possibly incidental finding and not source of his symptoms. I believe upper GI tract needs to be examined endoscopically to rule out recurrence of gastric carcinoma to rule out gastrojejunal stricture.   Plan:  Will plan EGD and also discuss his condition with Dr. Jaclynn Guarneri who is patient's surgeon. In the  meantime he will continue pantoprazole stay on low residue diet.

## 2012-07-18 ENCOUNTER — Ambulatory Visit (INDEPENDENT_AMBULATORY_CARE_PROVIDER_SITE_OTHER): Payer: Medicare Other | Admitting: Internal Medicine

## 2012-07-18 ENCOUNTER — Telehealth (INDEPENDENT_AMBULATORY_CARE_PROVIDER_SITE_OTHER): Payer: Self-pay | Admitting: *Deleted

## 2012-07-18 NOTE — Telephone Encounter (Signed)
Dr.Rehman called and states that Luis Guerra will need to have EGD and ask that I share this with [name]. No further instructions given.

## 2012-07-19 ENCOUNTER — Other Ambulatory Visit (INDEPENDENT_AMBULATORY_CARE_PROVIDER_SITE_OTHER): Payer: Self-pay | Admitting: *Deleted

## 2012-07-19 ENCOUNTER — Encounter (HOSPITAL_COMMUNITY): Payer: Self-pay | Admitting: Pharmacy Technician

## 2012-07-19 DIAGNOSIS — R109 Unspecified abdominal pain: Secondary | ICD-10-CM

## 2012-07-19 DIAGNOSIS — C169 Malignant neoplasm of stomach, unspecified: Secondary | ICD-10-CM

## 2012-07-19 NOTE — Telephone Encounter (Signed)
EGD sch'd 07/21/12 at 325 (225), appt info and instructions given to patient and his wife

## 2012-07-21 ENCOUNTER — Encounter (HOSPITAL_COMMUNITY)
Admission: RE | Disposition: A | Discharge status: Home / Self Care | Payer: Self-pay | Source: Ambulatory Visit | Attending: Internal Medicine

## 2012-07-21 ENCOUNTER — Encounter (HOSPITAL_COMMUNITY): Payer: Self-pay | Admitting: *Deleted

## 2012-07-21 ENCOUNTER — Ambulatory Visit (HOSPITAL_COMMUNITY)
Admission: RE | Admit: 2012-07-21 | Discharge: 2012-07-21 | Disposition: A | Discharge status: Home / Self Care | Payer: Medicare Other | Source: Ambulatory Visit | Attending: Internal Medicine | Admitting: Internal Medicine

## 2012-07-21 DIAGNOSIS — J449 Chronic obstructive pulmonary disease, unspecified: Secondary | ICD-10-CM | POA: Insufficient documentation

## 2012-07-21 DIAGNOSIS — Z85028 Personal history of other malignant neoplasm of stomach: Secondary | ICD-10-CM

## 2012-07-21 DIAGNOSIS — R109 Unspecified abdominal pain: Secondary | ICD-10-CM | POA: Insufficient documentation

## 2012-07-21 DIAGNOSIS — K289 Gastrojejunal ulcer, unspecified as acute or chronic, without hemorrhage or perforation: Secondary | ICD-10-CM | POA: Diagnosis not present

## 2012-07-21 DIAGNOSIS — C169 Malignant neoplasm of stomach, unspecified: Secondary | ICD-10-CM

## 2012-07-21 DIAGNOSIS — K319 Disease of stomach and duodenum, unspecified: Secondary | ICD-10-CM | POA: Diagnosis not present

## 2012-07-21 DIAGNOSIS — J4489 Other specified chronic obstructive pulmonary disease: Secondary | ICD-10-CM | POA: Insufficient documentation

## 2012-07-21 DIAGNOSIS — R1013 Epigastric pain: Secondary | ICD-10-CM | POA: Diagnosis not present

## 2012-07-21 HISTORY — PX: ESOPHAGOGASTRODUODENOSCOPY: SHX5428

## 2012-07-21 SURGERY — EGD (ESOPHAGOGASTRODUODENOSCOPY)
Anesthesia: Moderate Sedation

## 2012-07-21 MED ORDER — MIDAZOLAM HCL 5 MG/5ML IJ SOLN
INTRAMUSCULAR | Status: AC
  Filled 2012-07-21: qty 5

## 2012-07-21 MED ORDER — STERILE WATER FOR IRRIGATION IR SOLN
Status: DC | PRN
  Administered 2012-07-21: 16:00:00

## 2012-07-21 MED ORDER — MIDAZOLAM HCL 5 MG/5ML IJ SOLN
INTRAMUSCULAR | Status: DC | PRN
  Administered 2012-07-21: 2 mg via INTRAVENOUS
  Administered 2012-07-21: 1 mg via INTRAVENOUS

## 2012-07-21 MED ORDER — MEPERIDINE HCL 50 MG/ML IJ SOLN
INTRAMUSCULAR | Status: AC
  Filled 2012-07-21: qty 1

## 2012-07-21 MED ORDER — SODIUM CHLORIDE 0.45 % IV SOLN
INTRAVENOUS | Status: DC
  Administered 2012-07-21: 1000 mL via INTRAVENOUS

## 2012-07-21 MED ORDER — PANTOPRAZOLE SODIUM 40 MG PO TBEC: 40.0000 mg | DELAYED_RELEASE_TABLET | Freq: Every day | ORAL | Status: DC

## 2012-07-21 MED ORDER — BUTAMBEN-TETRACAINE-BENZOCAINE 2-2-14 % EX AERO
INHALATION_SPRAY | CUTANEOUS | Status: DC | PRN
  Administered 2012-07-21: 1 via TOPICAL

## 2012-07-21 MED ORDER — MEPERIDINE HCL 25 MG/ML IJ SOLN
INTRAMUSCULAR | Status: DC | PRN
  Administered 2012-07-21: 25 mg via INTRAVENOUS

## 2012-07-21 NOTE — Op Note (Signed)
EGD PROCEDURE REPORT  PATIENT:  Luis Guerra  MR#:  [id number] Birthdate:  19-Mar-1921, 78 y.o., male Endoscopist:  Dr. Malissa Hippo, MD Referred By:  Dr. Catalina Pizza, MD Procedure Date: 07/21/2012  Procedure:   EGD  Indications:  Patient is 78 year old Caucasian male who presents with recurrent abdominal pain dating posteriorly. He has history of small gastric adenocarcinoma which was resected endoscopically at [facility] one year ago. He had abdominopelvic CT which shows dilated stomach and duodenum more pronounced than previous study of 2009. Is also status post gastro-jejunostomy.            Informed Consent:  The risks, benefits, alternatives & imponderables which include, but are not limited to, bleeding, infection, perforation, drug reaction and potential missed lesion have been reviewed.  The potential for biopsy, lesion removal, esophageal dilation, etc. have also been discussed.  Questions have been answered.  All parties agreeable.  Please see history & physical in medical record for more information.  Medications:  Demerol 25 mg IV Versed 3 mg IV Cetacaine spray topically for oropharyngeal anesthesia  Description of procedure:  The endoscope was introduced through the mouth and advanced to the second portion of the duodenum without difficulty or limitations. The mucosal surfaces were surveyed very carefully during advancement of the scope and upon withdrawal.  Findings:  Esophagus:  Mucosa of the esophagus was normal. GE junction was wavy but no erosions or ulcers noted. GEJ:  39 cm Stomach:  Very large stomach full of bile. 500 mL was suctioned out and behind small amount of food debris. Patent gastrojejunostomy with small anastomotic ulcer. Scar noted at antrum next to a tattoo site of endoscopic submucosal resection of small gastric adenocarcinoma. Patent pyloric channel. On this and cardia were examined by retroflexing the scope and were normal. Duodenum:  Markedly dilated  second part of the duodenum. Small bowel; both afferent and efferent loops were examined and no abnormality noted other than 2two diverticula involving afferent loop.  Therapeutic/Diagnostic Maneuvers Performed:  None  Complications:  None  Impression: Markedly dilated with patent gastrojejunostomy with small anastomotic ulcer.  500 mL of bile suctioned out of stomach. Antral scar secondary to previous submucosal resection of gastric adenocarcinoma. No evidence of recurrence. Dilated second part of the duodenum. 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  Recommendations:  Increase pantoprazole to 40 mg by mouth every morning. Office visit in 6 weeks.   Bacilio Abascal U  07/21/2012  4:38 PM  CC: Dr. Dwana Melena, MD & Dr. Bonnetta Barry ref. provider found

## 2012-07-21 NOTE — H&P (Signed)
Luis Guerra is an 78 y.o. male.   Chief Complaint: Patient is here for EGD. HPI: Patient is a 29-year-old Caucasian male with complicated GI history who presents with intermittent abdominal pain radiating posteriorly. He has not experienced nausea vomiting or melena. He denies dysphagia. He has history of small gastric adenocarcinoma which was removed endoscopically at [facility] 1 year ago. He had abdominopelvic CT which shows markedly dilated stomach and most of the duodenum. This dilation is chronic but a lot more pronounced on this study compared to study from 2009. He has had gastrojejunostomy previously.  Past Medical History  Diagnosis Date  . Coronary artery disease     WITH HISTORY OF OCCLUSION OF A DIAGONAL BRANCH  . SBO (small bowel obstruction)   . Hypothyroidism   . Carotid occlusion, right   . COPD (chronic obstructive pulmonary disease)   . Gastritis   . AAA (abdominal aortic aneurysm)   . Hiatal hernia   . Weight loss   . Dysphagia   . Cancer     Past Surgical History  Procedure Date  . Cardiac catheterization 06/08/2007    EF 55%  . Abdominal aortic aneurysm repair   . Cardiovascular stress test 05/03/2006    EF 60%  . Upper gastrointestinal endoscopy 07/13/06  . Upper gastrointestinal endoscopy 02/19/05  . Upper gastrointestinal endoscopy 07/22/2010    EGD ED  . Colonoscopy 07/22/2010  . Hernia repair   . Esophagogastroduodenoscopy 05/26/2011    Procedure: ESOPHAGOGASTRODUODENOSCOPY (EGD);  Surgeon: Malissa Hippo, MD;  Location: [facility];  Service: Endoscopy;  Laterality: N/A;  3:00  . Balloon dilation 05/26/2011    Procedure: BALLOON DILATION;  Surgeon: Malissa Hippo, MD;  Location: [facility] SUITE;  Service: Endoscopy;  Laterality: N/A;    Family History  Problem Relation Age of Onset  . Heart disease Father   . Healthy Sister   . Heart disease Sister   . Stroke Sister   . Stroke Daughter   . Diabetes Daughter   . Healthy Son    Social History:   reports that he quit smoking about 54 years ago. He has never used smokeless tobacco. He reports that he does not drink alcohol or use illicit drugs.  Allergies:  Allergies  Allergen Reactions  . Aspirin Other (See Comments)    G.I. Upset   . Esomeprazole Magnesium Diarrhea  . Niacin Swelling  . Penicillins Swelling    Medications Prior to Admission  Medication Sig Dispense Refill  . acetaminophen (TYLENOL) 500 MG tablet Take 1,000 mg by mouth every 6 (six) hours as needed. For pain       . Fluticasone-Salmeterol (ADVAIR DISKUS) 250-50 MCG/DOSE AEPB Inhale 1 puff into the lungs every 12 (twelve) hours.        Marland Kitchen levothyroxine (SYNTHROID, LEVOTHROID) 125 MCG tablet Take 125 mcg by mouth daily.        Marland Kitchen losartan (COZAAR) 50 MG tablet Take 50 mg by mouth daily.        . Multiple Vitamin (MULTIVITAMIN) tablet Take 1 tablet by mouth daily.        . pantoprazole (PROTONIX) 20 MG tablet Take 20 mg by mouth daily.      . Probiotic Product (ALIGN) 4 MG CAPS Take 4 mg by mouth daily.       . ranitidine (ZANTAC) 150 MG tablet Take 150 mg by mouth at bedtime.       . theophylline (THEOPHYLLINE) 300 MG 12 hr tablet Take 300  mg by mouth 2 (two) times daily.          No results found for this or any previous visit (from the past 48 hour(s)). No results found.  ROS  Blood pressure 167/81, pulse 57, temperature 97.7 F (36.5 C), temperature source Oral, resp. rate 15, height 6' (1.829 m), weight 141 lb (63.957 kg), SpO2 98.00%. Physical Exam  Constitutional: He appears well-developed and well-nourished.  HENT:  Mouth/Throat: Oropharynx is clear and moist.  Eyes: Conjunctivae normal are normal. No scleral icterus.  Neck: No thyromegaly present.  Cardiovascular: Normal rate, regular rhythm and normal heart sounds.   No murmur heard. GI: He exhibits no distension and no mass. There is no tenderness.       Mesh palpable in epigastric region.  Musculoskeletal: He exhibits no edema.    Lymphadenopathy:    He has no cervical adenopathy.  Neurological: He is alert.  Skin: Skin is warm and dry.     Assessment/Plan Abdominal pain. History of gastric adenocarcinoma resected endoscopically one year ago([facility]). Diagnostic EGD  REHMAN,NAJEEB U 07/21/2012, 3:58 PM

## 2012-07-21 NOTE — Progress Notes (Signed)
Small skin tear on left lower forearm cleaned and dress with 1/2 small opsite

## 2012-07-24 ENCOUNTER — Encounter (HOSPITAL_COMMUNITY): Payer: Self-pay | Admitting: Internal Medicine

## 2012-07-26 ENCOUNTER — Telehealth (INDEPENDENT_AMBULATORY_CARE_PROVIDER_SITE_OTHER): Payer: Self-pay | Admitting: *Deleted

## 2012-07-26 NOTE — Telephone Encounter (Signed)
Per EGD op note, patient needs ov in 6 weeks with [facility]

## 2012-07-26 NOTE — Telephone Encounter (Signed)
Apt has been scheduled for 09/13/11 with Dr. Karilyn Cota.

## 2012-08-15 ENCOUNTER — Encounter (INDEPENDENT_AMBULATORY_CARE_PROVIDER_SITE_OTHER): Payer: Self-pay

## 2012-09-12 ENCOUNTER — Encounter (INDEPENDENT_AMBULATORY_CARE_PROVIDER_SITE_OTHER): Payer: Self-pay | Admitting: Internal Medicine

## 2012-09-12 ENCOUNTER — Ambulatory Visit (INDEPENDENT_AMBULATORY_CARE_PROVIDER_SITE_OTHER): Payer: Medicare Other | Admitting: Internal Medicine

## 2012-09-12 VITALS — BP 110/70 | HR 74 | Temp 97.1°F | Resp 18 | Ht 72.0 in | Wt 147.6 lb

## 2012-09-12 DIAGNOSIS — Z87898 Personal history of other specified conditions: Secondary | ICD-10-CM

## 2012-09-12 DIAGNOSIS — Z85028 Personal history of other malignant neoplasm of stomach: Secondary | ICD-10-CM | POA: Diagnosis not present

## 2012-09-12 DIAGNOSIS — K279 Peptic ulcer, site unspecified, unspecified as acute or chronic, without hemorrhage or perforation: Secondary | ICD-10-CM

## 2012-09-12 NOTE — Progress Notes (Signed)
Presenting complaint;  Followup for abdominal pain.  Subjective:  Patient is 79 year old Caucasian male who is here for scheduled visit accompanied by his wife. He was last seen on 07/17/2012 for abdominal pain and fullness. He has history of gastric outlet obstruction with gastrojejunostomy and history of small gastric adenocarcinoma for which she underwent endoscopic resection in October 2012 at [facility]. He underwent EGD on 07/21/2012 and noted to have markedly dilated stomach with patent gastro-jejunostomy and small anastomotic ulcer. 500 mL of bile was suctioned out of the stomach. Antral scar was noted site of previous submucosal resection of small asked her adenocarcinoma. Is advice to continue pantoprazole every morning. He states he is feeling better. His appetite is fair. He is eating 3 meals a day. He chew his food very thoroughly. He has gained 4 pounds her last 2 months. He has intermittent diarrhea no more than 3-4 times a month. He denies melena or rectal bleeding.  Current Medications: Current Outpatient Prescriptions  Medication Sig Dispense Refill  . acetaminophen (TYLENOL) 500 MG tablet Take 1,000 mg by mouth every 6 (six) hours as needed. For pain       . Fluticasone-Salmeterol (ADVAIR DISKUS) 250-50 MCG/DOSE AEPB Inhale 1 puff into the lungs every 12 (twelve) hours.        Marland Kitchen levothyroxine (SYNTHROID, LEVOTHROID) 125 MCG tablet Take 125 mcg by mouth daily.        Marland Kitchen losartan (COZAAR) 50 MG tablet Take 50 mg by mouth daily.        . Multiple Vitamin (MULTIVITAMIN) tablet Take 1 tablet by mouth daily.        . pantoprazole (PROTONIX) 40 MG tablet Take 1 tablet (40 mg total) by mouth daily.  30 tablet  11  . Probiotic Product (ALIGN) 4 MG CAPS Take 4 mg by mouth daily.       . ranitidine (ZANTAC) 150 MG tablet Take 150 mg by mouth at bedtime.       . theophylline (THEOPHYLLINE) 300 MG 12 hr tablet Take 300 mg by mouth 2 (two) times daily.           Objective: Blood pressure  110/70, pulse 74, temperature 97.1 F (36.2 C), temperature source Oral, resp. rate 18, height 6' (1.829 m), weight 147 lb 9.6 oz (66.951 kg). Patient is alert and appears to be in no acute distress. Conjunctiva is pink. Sclera is nonicteric Oropharyngeal mucosa is normal. No neck masses or thyromegaly noted. Abdomen is full. Bowel sounds are normal. On palpation is soft with irregular T2 abdominal wall secondary to mash placement. No tenderness organomegaly or masses noted.  No LE edema or clubbing noted.    Assessment:  Abdominal pain has completely resolved. Last EGD revealed small anastomotic ulcer no evidence of gastric carcinoma and a very large stomach which is chronic. He may also have underlying GI dysmotility but appears to be doing well. He has gained 4 pounds since his last visit.    Plan:  Patient will continue pantoprazole 40 mg by mouth daily. Unless he has problems he'll return for office visit in one year.

## 2012-09-12 NOTE — Patient Instructions (Addendum)
Call if abdominal pain recurs or if you have nausea and vomiting.

## 2012-09-17 DIAGNOSIS — Z85028 Personal history of other malignant neoplasm of stomach: Secondary | ICD-10-CM | POA: Insufficient documentation

## 2012-09-28 DIAGNOSIS — I739 Peripheral vascular disease, unspecified: Secondary | ICD-10-CM | POA: Diagnosis not present

## 2012-10-09 DIAGNOSIS — E039 Hypothyroidism, unspecified: Secondary | ICD-10-CM | POA: Diagnosis not present

## 2012-10-09 DIAGNOSIS — I10 Essential (primary) hypertension: Secondary | ICD-10-CM | POA: Diagnosis not present

## 2012-10-09 DIAGNOSIS — M549 Dorsalgia, unspecified: Secondary | ICD-10-CM | POA: Diagnosis not present

## 2012-10-09 DIAGNOSIS — E785 Hyperlipidemia, unspecified: Secondary | ICD-10-CM | POA: Diagnosis not present

## 2012-10-16 ENCOUNTER — Encounter: Payer: Self-pay | Admitting: Orthopedic Surgery

## 2012-10-16 ENCOUNTER — Ambulatory Visit (INDEPENDENT_AMBULATORY_CARE_PROVIDER_SITE_OTHER): Payer: Medicare Other | Admitting: Orthopedic Surgery

## 2012-10-16 VITALS — BP 130/62 | Ht 72.0 in | Wt 142.0 lb

## 2012-10-16 DIAGNOSIS — M1710 Unilateral primary osteoarthritis, unspecified knee: Secondary | ICD-10-CM

## 2012-10-16 NOTE — Patient Instructions (Signed)
You have received a steroid shot. 15% of patients experience increased pain at the injection site with in the next 24 hours. This is best treated with ice and tylenol extra strength 2 tabs every 8 hours. If you are still having pain please call the office.    

## 2012-10-16 NOTE — Progress Notes (Signed)
Patient ID: QUINNTEN CALVIN, male   DOB: 01/19/1921, 79 y.o.   MRN: [id number] Chief Complaint  Patient presents with  . Knee Pain    Bilateral knee pain request injections    Knee  Injection Procedure Note  Pre-operative Diagnosis: left knee oa  Post-operative Diagnosis: same  Indications: pain  Anesthesia: ethyl chloride   Procedure Details   Verbal consent was obtained for the procedure. Time out was completed.The joint was prepped with alcohol, followed by  Ethyl chloride spray and A 20 gauge needle was inserted into the knee via lateral approach; 4ml 1% lidocaine and 1 ml of depomedrol  was then injected into the joint . The needle was removed and the area cleansed and dressed.  Complications:  None; patient tolerated the procedure well.  Knee  Injection Procedure Note  Pre-operative Diagnosis: right knee oa  Post-operative Diagnosis: same  Indications: pain  Anesthesia: ethyl chloride   Procedure Details   Verbal consent was obtained for the procedure. Time out was completed.The joint was prepped with alcohol, followed by  Ethyl chloride spray and A 20 gauge needle was inserted into the knee via lateral approach; 4ml 1% lidocaine and 1 ml of depomedrol  was then injected into the joint . The needle was removed and the area cleansed and dressed.  Complications:  None; patient tolerated the procedure well.

## 2012-12-21 DIAGNOSIS — I739 Peripheral vascular disease, unspecified: Secondary | ICD-10-CM | POA: Diagnosis not present

## 2013-01-10 DIAGNOSIS — R42 Dizziness and giddiness: Secondary | ICD-10-CM | POA: Diagnosis not present

## 2013-01-13 ENCOUNTER — Emergency Department (HOSPITAL_COMMUNITY): Payer: Medicare Other

## 2013-01-13 ENCOUNTER — Inpatient Hospital Stay (HOSPITAL_COMMUNITY)
Admission: EM | Admit: 2013-01-13 | Discharge: 2013-01-16 | DRG: 149 | Disposition: A | Discharge status: Home / Self Care | Payer: Medicare Other | Attending: Family Medicine | Admitting: Family Medicine

## 2013-01-13 ENCOUNTER — Encounter (HOSPITAL_COMMUNITY): Payer: Self-pay | Admitting: *Deleted

## 2013-01-13 DIAGNOSIS — I10 Essential (primary) hypertension: Secondary | ICD-10-CM | POA: Diagnosis present

## 2013-01-13 DIAGNOSIS — E039 Hypothyroidism, unspecified: Secondary | ICD-10-CM | POA: Diagnosis present

## 2013-01-13 DIAGNOSIS — R001 Bradycardia, unspecified: Secondary | ICD-10-CM

## 2013-01-13 DIAGNOSIS — J449 Chronic obstructive pulmonary disease, unspecified: Secondary | ICD-10-CM | POA: Diagnosis present

## 2013-01-13 DIAGNOSIS — Z823 Family history of stroke: Secondary | ICD-10-CM | POA: Diagnosis not present

## 2013-01-13 DIAGNOSIS — I6521 Occlusion and stenosis of right carotid artery: Secondary | ICD-10-CM

## 2013-01-13 DIAGNOSIS — Z8249 Family history of ischemic heart disease and other diseases of the circulatory system: Secondary | ICD-10-CM

## 2013-01-13 DIAGNOSIS — I059 Rheumatic mitral valve disease, unspecified: Secondary | ICD-10-CM | POA: Diagnosis not present

## 2013-01-13 DIAGNOSIS — J4489 Other specified chronic obstructive pulmonary disease: Secondary | ICD-10-CM | POA: Diagnosis present

## 2013-01-13 DIAGNOSIS — R7402 Elevation of levels of lactic acid dehydrogenase (LDH): Secondary | ICD-10-CM | POA: Diagnosis not present

## 2013-01-13 DIAGNOSIS — Z886 Allergy status to analgesic agent status: Secondary | ICD-10-CM

## 2013-01-13 DIAGNOSIS — R279 Unspecified lack of coordination: Secondary | ICD-10-CM | POA: Diagnosis present

## 2013-01-13 DIAGNOSIS — I6529 Occlusion and stenosis of unspecified carotid artery: Secondary | ICD-10-CM | POA: Diagnosis not present

## 2013-01-13 DIAGNOSIS — Z79899 Other long term (current) drug therapy: Secondary | ICD-10-CM | POA: Diagnosis not present

## 2013-01-13 DIAGNOSIS — Z859 Personal history of malignant neoplasm, unspecified: Secondary | ICD-10-CM

## 2013-01-13 DIAGNOSIS — I498 Other specified cardiac arrhythmias: Secondary | ICD-10-CM | POA: Diagnosis present

## 2013-01-13 DIAGNOSIS — Z888 Allergy status to other drugs, medicaments and biological substances status: Secondary | ICD-10-CM | POA: Diagnosis not present

## 2013-01-13 DIAGNOSIS — R42 Dizziness and giddiness: Principal | ICD-10-CM | POA: Diagnosis present

## 2013-01-13 DIAGNOSIS — Z880 Allergy status to penicillin: Secondary | ICD-10-CM | POA: Diagnosis not present

## 2013-01-13 DIAGNOSIS — Z833 Family history of diabetes mellitus: Secondary | ICD-10-CM

## 2013-01-13 DIAGNOSIS — R270 Ataxia, unspecified: Secondary | ICD-10-CM

## 2013-01-13 DIAGNOSIS — I671 Cerebral aneurysm, nonruptured: Secondary | ICD-10-CM | POA: Diagnosis present

## 2013-01-13 DIAGNOSIS — E162 Hypoglycemia, unspecified: Secondary | ICD-10-CM | POA: Diagnosis present

## 2013-01-13 DIAGNOSIS — R7401 Elevation of levels of liver transaminase levels: Secondary | ICD-10-CM | POA: Diagnosis present

## 2013-01-13 DIAGNOSIS — R0989 Other specified symptoms and signs involving the circulatory and respiratory systems: Secondary | ICD-10-CM | POA: Diagnosis not present

## 2013-01-13 DIAGNOSIS — Z87891 Personal history of nicotine dependence: Secondary | ICD-10-CM | POA: Diagnosis not present

## 2013-01-13 DIAGNOSIS — I2510 Atherosclerotic heart disease of native coronary artery without angina pectoris: Secondary | ICD-10-CM | POA: Diagnosis present

## 2013-01-13 DIAGNOSIS — R269 Unspecified abnormalities of gait and mobility: Secondary | ICD-10-CM | POA: Diagnosis not present

## 2013-01-13 LAB — COMPREHENSIVE METABOLIC PANEL
ALT: 64 U/L — ABNORMAL HIGH (ref 0–53)
AST: 79 U/L — ABNORMAL HIGH (ref 0–37)
Albumin: 3.8 g/dL (ref 3.5–5.2)
Alkaline Phosphatase: 124 U/L — ABNORMAL HIGH (ref 39–117)
CO2: 23 mEq/L (ref 19–32)
Chloride: 102 mEq/L (ref 96–112)
Creatinine, Ser: 1.11 mg/dL (ref 0.50–1.35)
Potassium: 3.5 mEq/L (ref 3.5–5.1)
Sodium: 137 mEq/L (ref 135–145)
Total Bilirubin: 0.2 mg/dL — ABNORMAL LOW (ref 0.3–1.2)

## 2013-01-13 LAB — CBC
MCHC: 32.4 g/dL (ref 30.0–36.0)
Platelets: 194 10*3/uL (ref 150–400)
RDW: 13.8 % (ref 11.5–15.5)
WBC: 8.4 10*3/uL (ref 4.0–10.5)

## 2013-01-13 LAB — DIFFERENTIAL
Basophils Absolute: 0.1 10*3/uL (ref 0.0–0.1)
Basophils Relative: 1 % (ref 0–1)
Lymphocytes Relative: 13 % (ref 12–46)
Neutro Abs: 6.4 10*3/uL (ref 1.7–7.7)
Neutrophils Relative %: 76 % (ref 43–77)

## 2013-01-13 LAB — RAPID URINE DRUG SCREEN, HOSP PERFORMED
Barbiturates: NOT DETECTED
Cocaine: NOT DETECTED
Tetrahydrocannabinol: NOT DETECTED

## 2013-01-13 LAB — URINALYSIS, ROUTINE W REFLEX MICROSCOPIC
Bilirubin Urine: NEGATIVE
Leukocytes, UA: NEGATIVE
Nitrite: NEGATIVE
Specific Gravity, Urine: 1.02 (ref 1.005–1.030)
Urobilinogen, UA: 0.2 mg/dL (ref 0.0–1.0)

## 2013-01-13 LAB — PROTIME-INR: INR: 0.99 (ref 0.00–1.49)

## 2013-01-13 LAB — APTT: aPTT: 35 seconds (ref 24–37)

## 2013-01-13 LAB — ETHANOL: Alcohol, Ethyl (B): <11 mg/dL (ref 0–11)

## 2013-01-13 MED ORDER — ENOXAPARIN SODIUM 40 MG/0.4ML ~~LOC~~ SOLN
40.0000 mg | SUBCUTANEOUS | Status: DC
  Administered 2013-01-13 – 2013-01-15 (×3): 40 mg via SUBCUTANEOUS
  Filled 2013-01-13 (×3): qty 0.4

## 2013-01-13 MED ORDER — PANTOPRAZOLE SODIUM 40 MG PO TBEC
40.0000 mg | DELAYED_RELEASE_TABLET | Freq: Every day | ORAL | Status: DC
  Administered 2013-01-13 – 2013-01-16 (×4): 40 mg via ORAL
  Filled 2013-01-13 (×4): qty 1

## 2013-01-13 MED ORDER — LEVOTHYROXINE SODIUM 25 MCG PO TABS
125.0000 ug | ORAL_TABLET | Freq: Every day | ORAL | Status: DC
  Administered 2013-01-13 – 2013-01-16 (×4): 125 ug via ORAL
  Filled 2013-01-13 (×4): qty 1

## 2013-01-13 MED ORDER — MOMETASONE FURO-FORMOTEROL FUM 100-5 MCG/ACT IN AERO
2.0000 | INHALATION_SPRAY | Freq: Two times a day (BID) | RESPIRATORY_TRACT | Status: DC
  Administered 2013-01-13 – 2013-01-16 (×6): 2 via RESPIRATORY_TRACT
  Filled 2013-01-13: qty 8.8

## 2013-01-13 MED ORDER — ACETAMINOPHEN 500 MG PO TABS
500.0000 mg | ORAL_TABLET | Freq: Two times a day (BID) | ORAL | Status: DC
  Administered 2013-01-13 – 2013-01-16 (×7): 500 mg via ORAL
  Filled 2013-01-13 (×7): qty 1

## 2013-01-13 MED ORDER — ONDANSETRON HCL 4 MG/2ML IJ SOLN: 4.0000 mg | Freq: Four times a day (QID) | INTRAMUSCULAR | Status: DC | PRN

## 2013-01-13 MED ORDER — SODIUM CHLORIDE 0.9 % IV SOLN
INTRAVENOUS | Status: AC
  Administered 2013-01-13 (×2): via INTRAVENOUS

## 2013-01-13 NOTE — ED Notes (Signed)
Ambulatory in room - pt lost balance several times falling to either left or right side.

## 2013-01-13 NOTE — H&P (Addendum)
History and Physical  Luis Guerra:[id number] DOB: Jul 27, 1921 DOA: 01/13/2013  Referring physician: Zadie Rhine, MD PCP: Catalina Pizza, MD   Chief Complaint: Dizzy  HPI:  79 year old man presented to the emergency department with a one-week history of dizziness. Was noted to have some ataxia during ambulation and was referred for further evaluation of lightheadedness and ataxia.  History obtained from patient at bedside. Excellent historian. He reports for 7 days now he has had lightheadedness. This is provoked by up with change in position, especially standing up but also with movement of his head. He has a history of vertigo in the past but the symptoms he feels are different. He is also had difficulty ambulating although he has not fallen or passed out. He saw his primary care physician several days ago and one of his blood pressure medications was stopped as this was thought to be perhaps the etiology. The medication is unknown. He said no other symptoms of note.  In the emergency department he was noted to be afebrile, bradycardic in the 40s and 50s with normal blood pressure. Screening laboratory studies, urinalysis and urine drug screen were unremarkable. Imaging of the chest and head was unremarkable.  Review of Systems:  Negative for fever, visual changes, sore throat, rash, new muscle aches, chest pain, SOB, dysuria, bleeding, n/v/abdominal pain., Difficulty swallowing, numbness or tingling, difficulty speaking  Past Medical History  Diagnosis Date  . Coronary artery disease     WITH HISTORY OF OCCLUSION OF A DIAGONAL BRANCH  . SBO (small bowel obstruction)   . Carotid occlusion, right   . COPD (chronic obstructive pulmonary disease)   . Gastritis   . AAA (abdominal aortic aneurysm)   . Hiatal hernia   . Weight loss   . Dysphagia   . Cancer   . Hypothyroidism     Past Surgical History  Procedure Laterality Date  . Cardiac catheterization  06/08/2007    EF 55%  .  Abdominal aortic aneurysm repair    . Cardiovascular stress test  05/03/2006    EF 60%  . Upper gastrointestinal endoscopy  07/13/06  . Upper gastrointestinal endoscopy  02/19/05  . Upper gastrointestinal endoscopy  07/22/2010    EGD ED  . Colonoscopy  07/22/2010  . Hernia repair    . Esophagogastroduodenoscopy  05/26/2011    Procedure: ESOPHAGOGASTRODUODENOSCOPY (EGD);  Surgeon: Malissa Hippo, MD;  Location: [facility];  Service: Endoscopy;  Laterality: N/A;  3:00  . Balloon dilation  05/26/2011    Procedure: BALLOON DILATION;  Surgeon: Malissa Hippo, MD;  Location: [facility];  Service: Endoscopy;  Laterality: N/A;  . Esophagogastroduodenoscopy  07/21/2012    Procedure: ESOPHAGOGASTRODUODENOSCOPY (EGD);  Surgeon: Malissa Hippo, MD;  Location: [facility];  Service: Endoscopy;  Laterality: N/A;  325-changed to 225 Ann to notify pt  . Cholecystectomy    . Low back surgery      Social History:  reports that he quit smoking about 55 years ago. He has never used smokeless tobacco. He reports that he does not drink alcohol or use illicit drugs.  Allergies  Allergen Reactions  . Aspirin Other (See Comments)    G.I. Upset   . Esomeprazole Magnesium Diarrhea  . Niacin Swelling  . Penicillins Swelling    Family History  Problem Relation Age of Onset  . Heart disease Father   . Healthy Sister   . Heart disease Sister   . Stroke Sister   . Stroke Daughter   .  Diabetes Daughter   . Healthy Son      Prior to Admission medications   Medication Sig Start Date End Date Taking? Authorizing Provider  acetaminophen (TYLENOL) 500 MG tablet Take 500 mg by mouth 2 (two) times daily. For pain   Yes Historical Provider, MD  Fluticasone-Salmeterol (ADVAIR DISKUS) 250-50 MCG/DOSE AEPB Inhale 1 puff into the lungs every 12 (twelve) hours.     Yes Historical Provider, MD  levothyroxine (SYNTHROID, LEVOTHROID) 125 MCG tablet Take 125 mcg by mouth daily.     Yes Historical Provider, MD   Multiple Vitamin (MULTIVITAMIN) tablet Take 1 tablet by mouth daily.     Yes Historical Provider, MD  pantoprazole (PROTONIX) 40 MG tablet Take 1 tablet (40 mg total) by mouth daily. 07/21/12  Yes Malissa Hippo, MD  Probiotic Product (ALIGN) 4 MG CAPS Take 4 mg by mouth daily.    Yes Historical Provider, MD  theophylline (THEOPHYLLINE) 300 MG 12 hr tablet Take 300 mg by mouth 2 (two) times daily.     Yes Historical Provider, MD  losartan (COZAAR) 50 MG tablet Take 50 mg by mouth daily.      Historical Provider, MD   Physical Exam: Filed Vitals:   01/13/13 1027 01/13/13 1029 01/13/13 1100 01/13/13 1205  BP: 127/82 154/60  149/78  Pulse: 83 52  44  Temp:    97.4 F (36.3 C)  TempSrc:    Oral  Resp:   18 16  Height:    6' (1.829 m)  Weight:    62.687 kg (138 lb 3.2 oz)  SpO2:   100%     General:  Appears calm and comfortable. Very alert. Very well-appearing for age. Eyes: Pupils, irises, lips appear normal. ENT: grossly normal hearing, lips & tongue Neck: no LAD, masses or thyromegaly Cardiovascular: RRR, no m/r/g. No LE edema. Telemetry: Sinus rhythm, sinus bradycardia. Respiratory: CTA bilaterally, no w/r/r. Normal respiratory effort. Abdomen: soft, ntnd Skin: no rash or induration seen  Musculoskeletal: grossly normal tone BUE/BLE. Strength 5/5 upper and lower extremities bilaterally. No upper extremity dysdiadochokinesis. Psychiatric: grossly normal mood and affect, speech fluent and appropriate. Neurologic: Cranial nerves 2-12 intact.  Wt Readings from Last 3 Encounters:  01/13/13 62.687 kg (138 lb 3.2 oz)  10/16/12 64.411 kg (142 lb)  09/12/12 66.951 kg (147 lb 9.6 oz)    Labs on Admission:  Basic Metabolic Panel:  Recent Labs Lab 01/13/13 0922  NA 137  K 3.5  CL 102  CO2 23  GLUCOSE 69*  BUN 21  CREATININE 1.11  CALCIUM 9.0    Liver Function Tests:  Recent Labs Lab 01/13/13 0922  AST 79*  ALT 64*  ALKPHOS 124*  BILITOT 0.2*  PROT 7.8  ALBUMIN  3.8    CBC:  Recent Labs Lab 01/13/13 0922  WBC 8.4  NEUTROABS 6.4  HGB 14.5  HCT 44.7  MCV 93.3  PLT 194    Cardiac Enzymes:  Recent Labs Lab 01/13/13 0922  TROPONINI <0.30   CBG:  Recent Labs Lab 01/13/13 0934  GLUCAP 85     Radiological Exams on Admission: Dg Chest 2 View  01/13/2013   *RADIOLOGY REPORT*  Clinical Data: Dizziness and weak  CHEST - 2 VIEW  Comparison: This chest radiograph 10/24/2007  Findings: Cardiac leads project over the chest.  Heart size is borderline enlarged and likely accentuated by apical lordotic technique.  The thoracic aorta contour is tortuous and stable. Pulmonary vascularity is borderline prominent, especially in the lateral  projection.  There is mild chronic peribronchial thickening.  No focal airspace disease, effusion, or pneumothorax. No acute osseous abnormality identified.  IMPRESSION: Borderline cardiomegaly and mild pulmonary vascular congestion.   Original Report Authenticated By: Britta Mccreedy, M.D.   Ct Head Wo Contrast  01/13/2013   *RADIOLOGY REPORT*  Clinical Data: Dizziness  CT HEAD WITHOUT CONTRAST  Technique:  Contiguous axial images were obtained from the base of the skull through the vertex without contrast.  Comparison: 09/19/2006  Findings: Moderate global atrophy.  Chronic ischemic changes in the periventricular white matter.  Lacunar infarcts in the left thalamus and right basal ganglia.  No mass effect or midline shift, or acute intracranial hemorrhage.  Minimal fluid in the dependent mastoid air cells.  Visualized paranasal sinuses are clear. Cranium is intact.  IMPRESSION: Minimal fluid in the mastoid air cells.  Chronic ischemic changes and atrophy are noted.   Original Report Authenticated By: Jolaine Click, M.D.    EKG: Independently reviewed. Sinus rhythm. Left axis deviation. Cannot exclude septal MI. No significant change compared to 03/26/2011   Principal Problem:   Lightheadedness Active Problems:   Carotid  occlusion, right   Ataxia   Elevated transaminase level   Assessment/Plan 1. Lightheadedness: History suggestive of vasovagal/orthostasis. However orthostatics were equivocal. Consider dysrhythmia. Intermittently bradycardic, not on rate control agents. Followup theophylline level. No focal neurologic deficits except possible ataxia which may be related to lightheadedness. Cerebellar stroke a possibility though less likely. IV fluids, repeat orthostatics in the morning. Patient reports anaphylaxis to ASA. Also with significant GI history. Given low clinical suspicion for CVA will not initiate Plavix at this time. 2. Ataxia: Significance unclear. May be related to sensation of lightheadedness and possible orthostasis. Clinically I doubt stroke at this point although I cannot exclude it. Not a candidate for TPA regardless given duration of symptoms. 3. Mild transaminase elevation: Significance unclear. Not on statin. Follow up in the morning. 4. Mild hypoglycemia: Significance unclear. Not diabetic. Monitor. 5. History of coronary artery disease: Asymptomatic. 6. History of right-sided carotid occlusion 7. COPD: Stable. Not on oxygen at home. Followup theophylline level. 8. Hypertension: Given advanced age will discontinue current antihypertensives. Allow BP up to 150/90.  History most suggestive of orthostasis. However given ataxia will proceed with stroke evaluation. Given chronicity of symptoms full evaluation could be deferred to the outpatient setting if stable 5/18. No signs or symptoms to suggest dysphagia. Regular diet.  Code Status: Full code, confirmed Family Communication: Discussed with wife at bedside Disposition Plan/Anticipated LOS: Observation, less than 48 hours  Time spent: 55 minutes  Brendia Sacks, MD  [facility] Pager [phone] 01/13/2013, 1:08 PM

## 2013-01-13 NOTE — ED Provider Notes (Signed)
History  This chart was scribed for Joya Gaskins, MD by Shari Heritage, ED Scribe. The patient was seen in room [facility]18[facility]. Patient's care was started at 0907.   CSN: [id number]  Arrival date & time 01/13/13  4098   First MD Initiated Contact with Patient 01/13/13 [phone]      Chief Complaint  Patient presents with  . Dizziness    The history is provided by the patient. No language interpreter was used.    HPI Comments: ADDIS TUOHY is a 79 y.o. male with history of coronary artery disease, COPD, AAA and repair, cardiac cath who presents to the Emergency Department complaining of persistent, moderate dizziness for the past week. Dizziness is improved while lying or sitting still and is worse with changes in position and ambulation. He describes dizziness as "like the room is spinning" and "like he is drunk." He states that ambulation has been difficult due to this sensation, but he denies any falls. He reports that he often has to hold on to things to steady himself while walking. Patient says that he has a history of peripheral vertigo, but this sensation is different from past. He denies any pain or lightheadedness. There is no extremity weakness, nausea, vomiting, diarrhea, hematochezia, headaches, chest pain, shortness of breath, fever or any other symptoms. Patient's other medical history includes hypothyroidism, hiatal hernia, and cancer.   Past Medical History  Diagnosis Date  . Coronary artery disease     WITH HISTORY OF OCCLUSION OF A DIAGONAL BRANCH  . SBO (small bowel obstruction)   . Hypothyroidism   . Carotid occlusion, right   . COPD (chronic obstructive pulmonary disease)   . Gastritis   . AAA (abdominal aortic aneurysm)   . Hiatal hernia   . Weight loss   . Dysphagia   . Cancer     Past Surgical History  Procedure Laterality Date  . Cardiac catheterization  06/08/2007    EF 55%  . Abdominal aortic aneurysm repair    . Cardiovascular stress test  05/03/2006     EF 60%  . Upper gastrointestinal endoscopy  07/13/06  . Upper gastrointestinal endoscopy  02/19/05  . Upper gastrointestinal endoscopy  07/22/2010    EGD ED  . Colonoscopy  07/22/2010  . Hernia repair    . Esophagogastroduodenoscopy  05/26/2011    Procedure: ESOPHAGOGASTRODUODENOSCOPY (EGD);  Surgeon: Malissa Hippo, MD;  Location: [facility];  Service: Endoscopy;  Laterality: N/A;  3:00  . Balloon dilation  05/26/2011    Procedure: BALLOON DILATION;  Surgeon: Malissa Hippo, MD;  Location: [facility];  Service: Endoscopy;  Laterality: N/A;  . Esophagogastroduodenoscopy  07/21/2012    Procedure: ESOPHAGOGASTRODUODENOSCOPY (EGD);  Surgeon: Malissa Hippo, MD;  Location: [facility];  Service: Endoscopy;  Laterality: N/A;  325-changed to 225 Ann to notify pt    Family History  Problem Relation Age of Onset  . Heart disease Father   . Healthy Sister   . Heart disease Sister   . Stroke Sister   . Stroke Daughter   . Diabetes Daughter   . Healthy Son     History  Substance Use Topics  . Smoking status: Former Smoker    Quit date: 08/30/1957  . Smokeless tobacco: Never Used  . Alcohol Use: No      Review of Systems A complete 10 system review of systems was obtained and all systems are negative except as noted in the HPI and PMH.  Allergies  Aspirin; Esomeprazole magnesium; Niacin; and Penicillins  Home Medications   Current Outpatient Rx  Name  Route  Sig  Dispense  Refill  . acetaminophen (TYLENOL) 500 MG tablet   Oral   Take 500 mg by mouth 2 (two) times daily. For pain         . Fluticasone-Salmeterol (ADVAIR DISKUS) 250-50 MCG/DOSE AEPB   Inhalation   Inhale 1 puff into the lungs every 12 (twelve) hours.           Marland Kitchen levothyroxine (SYNTHROID, LEVOTHROID) 125 MCG tablet   Oral   Take 125 mcg by mouth daily.           Marland Kitchen losartan (COZAAR) 50 MG tablet   Oral   Take 50 mg by mouth daily.           . Multiple Vitamin (MULTIVITAMIN) tablet    Oral   Take 1 tablet by mouth daily.           . pantoprazole (PROTONIX) 40 MG tablet   Oral   Take 1 tablet (40 mg total) by mouth daily.   30 tablet   11   . Probiotic Product (ALIGN) 4 MG CAPS   Oral   Take 4 mg by mouth daily.          . ranitidine (ZANTAC) 150 MG tablet   Oral   Take 150 mg by mouth at bedtime.          . theophylline (THEOPHYLLINE) 300 MG 12 hr tablet   Oral   Take 300 mg by mouth 2 (two) times daily.             Triage Vitals: BP 144/90  Temp(Src) 97.7 F (36.5 C) (Oral)  Resp 20  Wt 145 lb (65.772 kg)  BMI 19.66 kg/m2  SpO2 99%  Physical Exam CONSTITUTIONAL: Well developed/well nourished HEAD: Normocephalic/atraumatic EYES: EOMI/PERRL ENMT: Mucous membranes moist NECK: supple no meningeal signs, no bruits SPINE:entire spine nontender CV: S1/S2 noted, no murmurs/rubs/gallops noted LUNGS: Lungs are clear to auscultation bilaterally, no apparent distress ABDOMEN: soft, nontender, no rebound or guarding GU:no cva tenderness NEURO: Pt is awake/alert, moves all extremitiesx4, Awake/alert, facies symmetric, no arm or leg drift is noted, patient with difficulty walking and ataxia EXTREMITIES: pulses normal, full ROM SKIN: warm, color normal PSYCH: no abnormalities of mood noted  ED Course  Procedures  DIAGNOSTIC STUDIES: Oxygen Saturation is 99% on room air, normal by my interpretation.    COORDINATION OF CARE: 9:20 AM- Patient informed of current plan for treatment and evaluation and agrees with plan at this time.   11:19 AM Pt will be admitted due to ataxia and he is a fall risk Possible occult cerebellar infarct Theophylline pending at time admission D/w dr Irene Limbo, to admit Will need MR imaging    Labs Reviewed  COMPREHENSIVE METABOLIC PANEL - Abnormal; Notable for the following:    Glucose, Bld 69 (*)    AST 79 (*)    ALT 64 (*)    Alkaline Phosphatase 124 (*)    Total Bilirubin 0.2 (*)    GFR calc non Af Amer 56  (*)    GFR calc Af Amer 65 (*)    All other components within normal limits  ETHANOL  PROTIME-INR  APTT  CBC  DIFFERENTIAL  URINE RAPID DRUG SCREEN (HOSP PERFORMED)  URINALYSIS, ROUTINE W REFLEX MICROSCOPIC  TROPONIN I  GLUCOSE, CAPILLARY  THEOPHYLLINE LEVEL     Dg Chest 2 View  01/13/2013   *RADIOLOGY REPORT*  Clinical Data: Dizziness and weak  CHEST - 2 VIEW  Comparison: This chest radiograph 10/24/2007  Findings: Cardiac leads project over the chest.  Heart size is borderline enlarged and likely accentuated by apical lordotic technique.  The thoracic aorta contour is tortuous and stable. Pulmonary vascularity is borderline prominent, especially in the lateral projection.  There is mild chronic peribronchial thickening.  No focal airspace disease, effusion, or pneumothorax. No acute osseous abnormality identified.  IMPRESSION: Borderline cardiomegaly and mild pulmonary vascular congestion.   Original Report Authenticated By: Britta Mccreedy, M.D.   Ct Head Wo Contrast  01/13/2013   *RADIOLOGY REPORT*  Clinical Data: Dizziness  CT HEAD WITHOUT CONTRAST  Technique:  Contiguous axial images were obtained from the base of the skull through the vertex without contrast.  Comparison: 09/19/2006  Findings: Moderate global atrophy.  Chronic ischemic changes in the periventricular white matter.  Lacunar infarcts in the left thalamus and right basal ganglia.  No mass effect or midline shift, or acute intracranial hemorrhage.  Minimal fluid in the dependent mastoid air cells.  Visualized paranasal sinuses are clear. Cranium is intact.  IMPRESSION: Minimal fluid in the mastoid air cells.  Chronic ischemic changes and atrophy are noted.   Original Report Authenticated By: Jolaine Click, M.D.     No diagnosis found.    MDM  Nursing notes including past medical history and social history reviewed and considered in documentation Labs/vital reviewed and considered xrays reviewed and considered       Date: 01/13/2013  Rate: 89  Rhythm: normal sinus rhythm  QRS Axis: left  Intervals: normal  ST/T Wave abnormalities: nonspecific ST changes  Conduction Disutrbances:none  Narrative Interpretation:   Old EKG Reviewed: unchanged    I personally performed the services described in this documentation, which was scribed in my presence. The recorded information has been reviewed and is accurate.      Joya Gaskins, MD 01/13/13 1120

## 2013-01-13 NOTE — Progress Notes (Signed)
Patient arrived to floor from ED,alert,and oriented .No c/o pain or discomfort noted.B/P 149/78,heart rate 38-39,patient asymptomatic,Dr Irene Limbo notified.Will continue monitor patient.Wife at bedside.

## 2013-01-13 NOTE — ED Notes (Signed)
Reports dizziness, room spinning x 1 week; denies HA; denies n/v/d; denies chest pain; denies shortness of breath; reports hx of vertigo, but states this feels different; states "feels drunk" when he walks.

## 2013-01-13 NOTE — ED Notes (Signed)
POCT CBG Result: 85

## 2013-01-14 ENCOUNTER — Observation Stay (HOSPITAL_COMMUNITY): Payer: Medicare Other

## 2013-01-14 DIAGNOSIS — I6529 Occlusion and stenosis of unspecified carotid artery: Secondary | ICD-10-CM | POA: Diagnosis not present

## 2013-01-14 DIAGNOSIS — R42 Dizziness and giddiness: Secondary | ICD-10-CM

## 2013-01-14 LAB — HEMOGLOBIN A1C: Hgb A1c MFr Bld: 5.4 % (ref <5.7)

## 2013-01-14 LAB — COMPREHENSIVE METABOLIC PANEL
ALT: 51 U/L (ref 0–53)
AST: 45 U/L — ABNORMAL HIGH (ref 0–37)
CO2: 23 mEq/L (ref 19–32)
Calcium: 8.3 mg/dL — ABNORMAL LOW (ref 8.4–10.5)
GFR calc non Af Amer: 61 mL/min — ABNORMAL LOW (ref >90)
Potassium: 3.6 mEq/L (ref 3.5–5.1)
Sodium: 141 mEq/L (ref 135–145)

## 2013-01-14 LAB — LIPID PANEL
HDL: 34 mg/dL — ABNORMAL LOW (ref >39)
LDL Cholesterol: 39 mg/dL (ref 0–99)

## 2013-01-14 NOTE — Progress Notes (Signed)
[facility] PROGRESS NOTE  DELLIS VOGHT WUJ:[id number] DOB: [birth year] DOA: 01/13/2013 PCP: Catalina Pizza, MD  Assessment/Plan: 1. Lightheadedness: Continue to suspect vasovagal/orthostasis, possible dehydration although there is no evidence of orthostasis today. Symptoms do not suggest vertigo. Sinus bradycardia noted, no arrhythmias or evidence of sinus node dysfunction. We will liberalize blood pressure control. Cerebellar stroke a consideration although felt to be less likely by history and exam. Complete stroke evaluation nevertheless. Anaphylaxis to aspirin. Given low clinical suspicion, chronicity of symptoms will not add Plavix at this point. 2. Possible ataxia: Continue to suspect related to blood pressure although stroke evaluation in process. Physical therapy evaluation pending. 3. Mild transaminase elevation: Unclear significance. Nearly resolved. Followup as an outpatient. Hypoglycemia resolved 4. History of right-sided carotid occlusion 5. History of COPD: Stable. Theophylline level subtherapeutic. Resume theophylline. 6. Hypertension: Stable. Blood pressure medications have been completely discontinued.   Obtain 2-D echocardiogram, MRI brain, MRA head  Physical therapy consultation  Code Status: Full code  DVT prophylaxis: Enoxaparin  Family Communication: Discussed with son, daughter and wife at bedside  Disposition Plan: Anticipate discharge home 5/19 1 workup complete  Brendia Sacks, MD  [facility]  Pager [phone] If 7PM-7AM, please contact night-coverage at [email or web address], password [facility] 01/14/2013, 10:48 AM  LOS: 1 day   Brief narrative: 79 year old man presented to the emergency department with a one-week history of dizziness. Was noted to have some ataxia during ambulation and was referred for further evaluation of lightheadedness and ataxia.  Consultants:  Physical therapy  Procedures:  2-D echocardiogram  HPI/Subjective: Overall feels  better. Still lightheaded with position changes. Able to ambulate to the bathroom better, still some "missed steps". No paresthesias. No new focal deficits.  Objective: Filed Vitals:   01/14/13 0801 01/14/13 0802 01/14/13 0803 01/14/13 1015  BP: 119/74 119/76 145/83   Pulse: 82 80 89   Temp:      TempSrc:      Resp:      Height:      Weight:      SpO2:    95%    Intake/Output Summary (Last 24 hours) at 01/14/13 1048 Last data filed at 01/14/13 0500  Gross per 24 hour  Intake  577.5 ml  Output   1300 ml  Net -722.5 ml   Filed Weights   01/13/13 0916 01/13/13 1205  Weight: 65.772 kg (145 lb) 62.687 kg (138 lb 3.2 oz)    Exam:  General:  Appears calm and comfortable Eyes: Pupils, irises, lids appear normal Cardiovascular: RRR, no m/r/g. No LE edema. Telemetry: Sinus bradycardia Respiratory: CTA bilaterally, no w/r/r. Normal respiratory effort. Musculoskeletal: grossly normal tone BUE/BLE. Strength 5/5 upper and lower extremities bilaterally. Excellent strength for age. No upper extremity or lower extremity dysdiadochokinesis. Psychiatric: grossly normal mood and affect, speech fluent and appropriate Neurologic: Cranial nerves appear grossly intact.  Data Reviewed: Basic Metabolic Panel:  Recent Labs Lab 01/13/13 0922 01/14/13 0624  NA 137 141  K 3.5 3.6  CL 102 109  CO2 23 23  GLUCOSE 69* 87  BUN 21 19  CREATININE 1.11 1.04  CALCIUM 9.0 8.3*   Liver Function Tests:  Recent Labs Lab 01/13/13 0922 01/14/13 0624  AST 79* 45*  ALT 64* 51  ALKPHOS 124* 101  BILITOT 0.2* 0.2*  PROT 7.8 6.4  ALBUMIN 3.8 3.1*   CBC:  Recent Labs Lab 01/13/13 0922  WBC 8.4  NEUTROABS 6.4  HGB 14.5  HCT 44.7  MCV 93.3  PLT 194  Cardiac Enzymes:  Recent Labs Lab 01/13/13 0922  TROPONINI <0.30   CBG:  Recent Labs Lab 01/13/13 0934  GLUCAP 85     Studies: Dg Chest 2 View  01/13/2013   *RADIOLOGY REPORT*  Clinical Data: Dizziness and weak  CHEST - 2  VIEW  Comparison: This chest radiograph 10/24/2007  Findings: Cardiac leads project over the chest.  Heart size is borderline enlarged and likely accentuated by apical lordotic technique.  The thoracic aorta contour is tortuous and stable. Pulmonary vascularity is borderline prominent, especially in the lateral projection.  There is mild chronic peribronchial thickening.  No focal airspace disease, effusion, or pneumothorax. No acute osseous abnormality identified.  IMPRESSION: Borderline cardiomegaly and mild pulmonary vascular congestion.   Original Report Authenticated By: Britta Mccreedy, M.D.   Ct Head Wo Contrast  01/13/2013   *RADIOLOGY REPORT*  Clinical Data: Dizziness  CT HEAD WITHOUT CONTRAST  Technique:  Contiguous axial images were obtained from the base of the skull through the vertex without contrast.  Comparison: 09/19/2006  Findings: Moderate global atrophy.  Chronic ischemic changes in the periventricular white matter.  Lacunar infarcts in the left thalamus and right basal ganglia.  No mass effect or midline shift, or acute intracranial hemorrhage.  Minimal fluid in the dependent mastoid air cells.  Visualized paranasal sinuses are clear. Cranium is intact.  IMPRESSION: Minimal fluid in the mastoid air cells.  Chronic ischemic changes and atrophy are noted.   Original Report Authenticated By: Jolaine Click, M.D.    Scheduled Meds: . acetaminophen  500 mg Oral BID  . enoxaparin (LOVENOX) injection  40 mg Subcutaneous Q24H  . levothyroxine  125 mcg Oral QAC breakfast  . mometasone-formoterol  2 puff Inhalation BID  . pantoprazole  40 mg Oral Daily   Continuous Infusions:   Principal Problem:   Lightheaded Active Problems:   Carotid occlusion, right   Lightheadedness   Ataxia   Elevated transaminase level     Brendia Sacks, MD  [facility] Pager [phone] If 7PM-7AM, please contact night-coverage at [email or web address], password [facility] 01/14/2013, 10:48 AM  LOS: 1 day   Time  spent: 20 minutes

## 2013-01-14 NOTE — Progress Notes (Signed)
Alerted pt is brady. Checked on pt. Assisted him to the bathroom. No c/o pain. No sign of distress. Will continue to monitor.

## 2013-01-15 ENCOUNTER — Inpatient Hospital Stay (HOSPITAL_COMMUNITY): Payer: Medicare Other

## 2013-01-15 ENCOUNTER — Telehealth: Payer: Self-pay | Admitting: Adult Health

## 2013-01-15 DIAGNOSIS — I2510 Atherosclerotic heart disease of native coronary artery without angina pectoris: Secondary | ICD-10-CM

## 2013-01-15 DIAGNOSIS — I6529 Occlusion and stenosis of unspecified carotid artery: Secondary | ICD-10-CM

## 2013-01-15 DIAGNOSIS — I059 Rheumatic mitral valve disease, unspecified: Secondary | ICD-10-CM

## 2013-01-15 DIAGNOSIS — R42 Dizziness and giddiness: Principal | ICD-10-CM

## 2013-01-15 MED ORDER — THEOPHYLLINE ER 300 MG PO TB12
300.0000 mg | ORAL_TABLET | Freq: Two times a day (BID) | ORAL | Status: DC
  Administered 2013-01-15 – 2013-01-16 (×3): 300 mg via ORAL
  Filled 2013-01-15 (×3): qty 1

## 2013-01-15 NOTE — Progress Notes (Signed)
[facility] PROGRESS NOTE  Luis Guerra:[id number] DOB: 04-13-1921 DOA: 01/13/2013 PCP: Catalina Pizza, MD  Assessment/Plan: 1. Lightheadedness: Etiology unclear. It has now been constant for more than one week. Initial suspicion was for vasovagal/or stasis possibly related to blood pressure medications. Symptoms do not suggest vertigo. Sinus bradycardia with pause noted but not felt to be clinically significant per cardiology. Liberalize blood pressure control. MRI brain negative, no evidence of stroke. MRA head reveals no abnormalities to explain chronic persistent lightheadedness. Anaphylaxis to aspirin.  2. Possible ataxia: None demonstrated during hospitalization.  3. Mild transaminase elevation: Unclear significance. Nearly resolved. Followup as an outpatient. Hypoglycemia resolved 4. History of right-sided carotid occlusion: Followed chronically. 5. History of COPD: Stable. Theophylline level subtherapeutic. Continue theophylline. 6. Hypertension: Stable. Blood pressure medications have been completely discontinued.   Followup 2-D echocardiogram  Anticipate discharge home later today  Code Status: Full code  DVT prophylaxis: Enoxaparin  Family Communication: Discussed with son, daughter and wife at bedside  Disposition Plan: Anticipate discharge home 5/19 1 workup complete  Luis Sacks, MD  [facility]  Pager [phone] If 7PM-7AM, please contact night-coverage at [email or web address], password [facility] 01/15/2013, 11:41 AM  LOS: 2 days   Brief narrative: 79 year old man presented to the emergency department with a one-week history of dizziness. Was noted to have some ataxia during ambulation and was referred for further evaluation of lightheadedness and ataxia.  Consultants:  Cardiology  Physical therapy: No followup.  Procedures:  2-D echocardiogram:  HPI/Subjective: Feels fine. He reports still has some lightheadedness which has been constant now for one  week. It is aggravated by quick position changes but never completely resolves. He had an episode of bradycardia this morning with a 4 second pause while he was shaving in bed. He is asymptomatic. He ambulated with physical therapy and did extremely well with no strength, balance or coordination dysfunction noted. He felt well ambulating. No focal deficits, no difficulty speaking, swallowing, recognizing objects, paresthesias or focal weakness.  Objective: Filed Vitals:   01/15/13 0803 01/15/13 1047 01/15/13 1048 01/15/13 1049  BP:  144/81 139/67 152/67  Pulse: 57 57 68 68  Temp:      TempSrc:      Resp:  18 18 18   Height:      Weight:      SpO2:  93% 96% 96%    Intake/Output Summary (Last 24 hours) at 01/15/13 1141 Last data filed at 01/15/13 0900  Gross per 24 hour  Intake    720 ml  Output   1000 ml  Net   -280 ml   Filed Weights   01/13/13 0916 01/13/13 1205  Weight: 65.772 kg (145 lb) 62.687 kg (138 lb 3.2 oz)    Exam:  General:  Appears calm and comfortable. Sitting is out of bed. Eyes: Pupils, irises, lids appear normal Cardiovascular: RRR, no m/r/g. No LE edema. Telemetry: Sinus bradycardia to a 4 second pause noted Respiratory: CTA bilaterally, no w/r/r. Normal respiratory effort. Musculoskeletal: Unchanged. grossly normal tone BUE/BLE. Strength 5/5 upper and lower extremities bilaterally. Excellent strength for age. No upper extremity dysdiadochokinesis. Psychiatric: grossly normal mood and affect, speech fluent and appropriate Neurologic: Cranial nerves appear grossly intact.  Data Reviewed: Basic Metabolic Panel:  Recent Labs Lab 01/13/13 0922 01/14/13 0624  NA 137 141  K 3.5 3.6  CL 102 109  CO2 23 23  GLUCOSE 69* 87  BUN 21 19  CREATININE 1.11 1.04  CALCIUM 9.0 8.3*   Liver Function Tests:  Recent Labs Lab 01/13/13 0922 01/14/13 0624  AST 79* 45*  ALT 64* 51  ALKPHOS 124* 101  BILITOT 0.2* 0.2*  PROT 7.8 6.4  ALBUMIN 3.8 3.1*    CBC:  Recent Labs Lab 01/13/13 0922  WBC 8.4  NEUTROABS 6.4  HGB 14.5  HCT 44.7  MCV 93.3  PLT 194   Cardiac Enzymes:  Recent Labs Lab 01/13/13 0922  TROPONINI <0.30   CBG:  Recent Labs Lab 01/13/13 0934  GLUCAP 85     Studies: Mr Maxine Glenn Head Wo Contrast  01/15/2013   *RADIOLOGY REPORT*  Clinical Data:  Dizziness.  MRI BRAIN WITHOUT CONTRAST MRA HEAD WITHOUT CONTRAST  Technique: Multiplanar, multiecho pulse sequences of the brain and surrounding structures were obtained according to standard protocol without intravenous contrast.  Angiographic images of the head were obtained using MRA technique without contrast.  Comparison: 01/13/2013 head CT.  MRI HEAD  Findings:  No acute infarct.  Small vessel disease type changes.  Global atrophy without hydrocephalus.  No intracranial hemorrhage.  No intracranial mass lesion detected on this unenhanced exam.  Occluded right internal carotid artery.  Please see below.  Mild transverse ligament hypertrophy.  Mild paranasal sinus mucosal thickening.  IMPRESSION: No acute infarct.  Small vessel disease type changes.  Global atrophy.  Mild paranasal sinus mucosal thickening.  Occluded right internal carotid artery.  Please see below.  MRA HEAD  Findings: The right internal carotid artery is occluded.  Collateral flow to the right carotid terminus.  Artifact versus focal stenosis proximal M1 segment middle cerebral artery bilaterally.  Mild narrowing and irregularity cavernous segment left internal carotid artery.  3.9 mm outpouching medial aspect right internal carotid artery proximal cavernous segment suggestive of small aneurysm as versus result of atherosclerotic type changes.  Marked narrowing proximal A2 segment right anterior cerebral artery.  Middle cerebral artery moderate branch vessel irregularity bilaterally.  Right vertebral artery is dominant.  Left vertebral artery is small after the takeoff of the left PICA.  Mild narrowing mid aspect  of the basilar artery.  Nonvisualization left AICA.  Narrowed irregular right AICA.  Mild irregularity of the superior cerebellar artery bilaterally.  Moderate to marked tandem stenosis with poor delineation of a majority of the right posterior cerebral artery.  Left posterior cerebral artery and moderate to marked distal branch vessel irregularity.  Slight bulge of the basilar tip just beyond the takeoff of the right superior cerebral artery may be related to fetal type origin of the right posterior cerebral artery.  IMPRESSION: Occluded right internal carotid artery.  Poor delineation of a majority of the right posterior cerebral artery.  Portion visualized is markedly narrowed and irregular.  High-grade stenosis proximal A2 segment right anterior cerebral artery.  Branch vessel irregularity.  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   posterior cerebral artery.   Original Report Authenticated By: Lacy Duverney, M.D.   Mr Brain Wo Contrast  01/15/2013   *RADIOLOGY REPORT*  Clinical Data:  Dizziness.  MRI BRAIN WITHOUT CONTRAST MRA HEAD WITHOUT CONTRAST  Technique: Multiplanar, multiecho pulse sequences of the brain and surrounding structures were obtained according to standard protocol without intravenous contrast.  Angiographic images of the head were obtained using MRA technique without contrast.  Comparison: 01/13/2013 head CT.  MRI HEAD  Findings:  No acute infarct.  Small vessel disease type changes.  Global atrophy without hydrocephalus.  No intracranial hemorrhage.  No  intracranial mass lesion detected on this unenhanced exam.  Occluded right internal carotid artery.  Please see below.  Mild transverse ligament hypertrophy.  Mild paranasal sinus mucosal thickening.  IMPRESSION: No acute infarct.  Small vessel disease type changes.  Global atrophy.  Mild paranasal sinus mucosal thickening.  Occluded  right internal carotid artery.  Please see below.  MRA HEAD  Findings: The right internal carotid artery is occluded.  Collateral flow to the right carotid terminus.  Artifact versus focal stenosis proximal M1 segment middle cerebral artery bilaterally.  Mild narrowing and irregularity cavernous segment left internal carotid artery.  3.9 mm outpouching medial aspect right internal carotid artery proximal cavernous segment suggestive of small aneurysm as versus result of atherosclerotic type changes.  Marked narrowing proximal A2 segment right anterior cerebral artery.  Middle cerebral artery moderate branch vessel irregularity bilaterally.  Right vertebral artery is dominant.  Left vertebral artery is small after the takeoff of the left PICA.  Mild narrowing mid aspect of the basilar artery.  Nonvisualization left AICA.  Narrowed irregular right AICA.  Mild irregularity of the superior cerebellar artery bilaterally.  Moderate to marked tandem stenosis with poor delineation of a majority of the right posterior cerebral artery.  Left posterior cerebral artery and moderate to marked distal branch vessel irregularity.  Slight bulge of the basilar tip just beyond the takeoff of the right superior cerebral artery may be related to fetal type origin of the right posterior cerebral artery.  IMPRESSION: Occluded right internal carotid artery.  Poor delineation of a majority of the right posterior cerebral artery.  Portion visualized is markedly narrowed and irregular.  High-grade stenosis proximal A2 segment right anterior cerebral artery.  Branch vessel irregularity.  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   posterior cerebral artery.   Original Report Authenticated By: Lacy Duverney, M.D.   US Carotid Duplex Bilateral  01/14/2013   *RADIOLOGY REPORT*  Clinical Data: Lightheadedness  BILATERAL CAROTID DUPLEX  ULTRASOUND  Technique: Wallace Cullens scale imaging, color Doppler and duplex ultrasound was performed of bilateral carotid and vertebral arteries in the neck.  Comparison:  None.  Criteria:  Quantification of carotid stenosis is based on velocity parameters that correlate the residual internal carotid diameter with NASCET-based stenosis levels, using the diameter of the distal internal carotid lumen as the denominator for stenosis measurement.  The following velocity measurements were obtained:                   PEAK SYSTOLIC/END DIASTOLIC RIGHT ICA:                        Occludedcm/sec CCA:                        86cm/sec SYSTOLIC ICA/CCA RATIO:     Not applicable DIASTOLIC ICA/CCA RATIO: ECA:                        85cm/sec  LEFT ICA:                        150cm/sec CCA:                        95cm/sec SYSTOLIC ICA/CCA RATIO:     1.57 DIASTOLIC ICA/CCA RATIO: ECA:  75cm/sec  Findings:  RIGHT CAROTID ARTERY: Mild plaque in the right common and external carotid arteries.  The right internal carotid artery is occluded just above the bulb.  RIGHT VERTEBRAL ARTERY:  Antegrade.  LEFT CAROTID ARTERY: There is moderate calcified plaque in the bulb.  Low resistance internal carotid Doppler pattern with sharp upstroke.  Moderate tortuosity of this vessel.  LEFT VERTEBRAL ARTERY:  Antegrade with a low resistance Doppler wave form.  IMPRESSION: Right internal carotid artery occlusion.  50-69% stenosis in the left internal carotid artery.   Original Report Authenticated By: Jolaine Click, M.D.    Scheduled Meds: . acetaminophen  500 mg Oral BID  . enoxaparin (LOVENOX) injection  40 mg Subcutaneous Q24H  . levothyroxine  125 mcg Oral QAC breakfast  . mometasone-formoterol  2 puff Inhalation BID  . pantoprazole  40 mg Oral Daily  . theophylline  300 mg Oral BID   Continuous Infusions:   Principal Problem:   Lightheaded Active Problems:   Carotid occlusion, right   Lightheadedness   Ataxia    Elevated transaminase level     Luis Sacks, MD  [facility] Pager [phone] If 7PM-7AM, please contact night-coverage at [email or web address], password [facility] 01/15/2013, 11:41 AM  LOS: 2 days

## 2013-01-15 NOTE — Progress Notes (Signed)
Patient heart rate on monitor 27,B/P 132/68,no c/o pain or discomfort noted.Dr Irene Limbo notified. Wife at bedside.

## 2013-01-15 NOTE — Telephone Encounter (Signed)
Daughter Larita Fife is calling to touch base with Joni Reining regarding patient's cardiology status.  Please return her call at your earliest convenience at [phone].

## 2013-01-15 NOTE — Consult Note (Addendum)
CARDIOLOGY CONSULT NOTE  Patient ID: Luis Guerra MRN: [id number] DOB/AGE: June 08, 1921 79 y.o.  Admit date: 01/13/2013 Referring Physician: [facility] Primary Tomasa Blase, MD Primary Cardiologist: [place], Peter MD Reason for Consultation:Bradycardia with pauses.  Principal Problem:   Lightheaded Active Problems:   Carotid occlusion, right   Lightheadedness   Ataxia   Elevated transaminase level  HPI: Luis Guerra is a 79 year old patient admitted with lightheadedness with standing.  Symptoms began a week ago Saturday  Prior to that had been doing OK  Had been very active.   He notes the dizziness with standing.  Once up for a bit OK  No dizziness with walking  No SOB with walking   Last week did not do much because of dizziness   He is normally followed by Dr. Peter [place], without history of CAD, most recent cardiac catheterization completed 2008 with a occluded second diagonal branch. Patient also has a history of carotid artery disease with totally occluded right carotid artery per Doppler studies this admission. He has a history of a AAA, and a hiatal hernia which was repaired in 2008 he has a history of hypothyroidism as well.    The patient was noted to have a 4 second pause this a.m. around 7:30. We are asked for cardiac recommendations in this setting. 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 He was seen by his primary care physician Dr. Margo Aye and taken off his anti-hypertensive as this was felt to be contributing to his dizziness. He is not on any AV nodal blocking agents.     His wife brought him to the emergency room when he nearly fell while walking into the living room. Mostly because of dizziness.He has had an MRI completed this a.m. with results pending. Echocardiogram has also been completed with results pending. He denies any focal deficits.  Review of systems complete and  found to be negative unless listed above   Past Medical History  Diagnosis Date  . Coronary artery disease     WITH HISTORY OF OCCLUSION OF A DIAGONAL BRANCH  . SBO (small bowel obstruction)   . Carotid occlusion, right   . COPD (chronic obstructive pulmonary disease)   . Gastritis   . AAA (abdominal aortic aneurysm)   . Hiatal hernia   . Weight loss   . Dysphagia   . Cancer   . Hypothyroidism     Family History  Problem Relation Age of Onset  . Heart disease Father   . Healthy Sister   . Heart disease Sister   . Stroke Sister   . Stroke Daughter   . Diabetes Daughter   . Healthy Son     History   Social History  . Marital Status: Married    Spouse Name: N/A    Number of Children: 5  . Years of Education: N/A   Occupational History  . retired     [place] tobacco   Social History Main Topics  . Smoking status: Former Smoker    Quit date: 08/30/1957  . Smokeless tobacco: Never Used  . Alcohol Use: No  . Drug Use: No  . Sexually Active: Not on file   Other Topics Concern  . Not on file   Social History Narrative  . No narrative on file    Past Surgical History  Procedure Laterality Date  . Cardiac catheterization  06/08/2007    EF 55%  .  Abdominal aortic aneurysm repair    . Cardiovascular stress test  05/03/2006    EF 60%  . Upper gastrointestinal endoscopy  07/13/06  . Upper gastrointestinal endoscopy  02/19/05  . Upper gastrointestinal endoscopy  07/22/2010    EGD ED  . Colonoscopy  07/22/2010  . Hernia repair    . Esophagogastroduodenoscopy  05/26/2011    Procedure: ESOPHAGOGASTRODUODENOSCOPY (EGD);  Surgeon: Malissa Hippo, MD;  [facility];  Service: Endoscopy;  Laterality: N/A;  3:00  . Balloon dilation  05/26/2011    Procedure: BALLOON DILATION;  Surgeon: Malissa Hippo, MD;  Location: [facility];  Service: Endoscopy;  Laterality: N/A;  . Esophagogastroduodenoscopy  07/21/2012    Procedure: ESOPHAGOGASTRODUODENOSCOPY (EGD);   Surgeon: Malissa Hippo, MD;  Location: [facility];  Service: Endoscopy;  Laterality: N/A;  325-changed to 225 Ann to notify pt  . Cholecystectomy    . Low back surgery       Prescriptions prior to admission  Medication Sig Dispense Refill  . acetaminophen (TYLENOL) 500 MG tablet Take 500 mg by mouth 2 (two) times daily. For pain      . Fluticasone-Salmeterol (ADVAIR DISKUS) 250-50 MCG/DOSE AEPB Inhale 1 puff into the lungs every 12 (twelve) hours.        Marland Kitchen levothyroxine (SYNTHROID, LEVOTHROID) 125 MCG tablet Take 125 mcg by mouth daily.        . Multiple Vitamin (MULTIVITAMIN) tablet Take 1 tablet by mouth daily.        . pantoprazole (PROTONIX) 40 MG tablet Take 1 tablet (40 mg total) by mouth daily.  30 tablet  11  . Probiotic Product (ALIGN) 4 MG CAPS Take 4 mg by mouth daily.       . theophylline (THEOPHYLLINE) 300 MG 12 hr tablet Take 300 mg by mouth 2 (two) times daily.        Marland Kitchen losartan (COZAAR) 50 MG tablet Take 50 mg by mouth daily.          Physical Exam: Blood pressure 132/68, pulse 57, temperature 97.6 F (36.4 C), temperature source Oral, resp. rate 18, height 6' (1.829 m), weight 138 lb 3.2 oz (62.687 kg), SpO2 97.00%.   General: Well developed, well nourished, but thin, in no acute distress Head: Eyes PERRLA, No xanthomas.   Normal cephalic and atramatic  Lungs: Clear bilaterally to auscultation and percussion. Heart: HRRR S1 S2, bradycardic, with soft systolic murmur..  Pulses are 2+ & equal.            Soft left carotid bruit. No JVD.  No abdominal bruits. No femoral bruits. Abdomen: Bowel sounds are positive, abdomen soft and non-tender without masses or                  Hernia's noted. Msk:  Back normal, normal gait. Normal strength and tone for age. Extremities: No clubbing, cyanosis or edema.  DP +1 Neuro: Alert and oriented X 3.  CN II to XII intact  Rapid alt intact.  Did not test gait. Psych:  Good affect, responds appropriately   Labs:   Lab Results   Component Value Date   WBC 8.4 01/13/2013   HGB 14.5 01/13/2013   HCT 44.7 01/13/2013   MCV 93.3 01/13/2013   PLT 194 01/13/2013    Recent Labs Lab 01/14/13 0624  NA 141  K 3.6  CL 109  CO2 23  BUN 19  CREATININE 1.04  CALCIUM 8.3*  PROT 6.4  BILITOT 0.2*  ALKPHOS 101  ALT 51  AST 45*  GLUCOSE 87        Radiology: Dg Chest 2 View  01/13/2013   *RADIOLOGY REPORT*  Clinical Data: Dizziness and weak  CHEST - 2 VIEW  Comparison: This chest radiograph 10/24/2007  Findings: Cardiac leads project over the chest.  Heart size is borderline enlarged and likely accentuated by apical lordotic technique.  The thoracic aorta contour is tortuous and stable. Pulmonary vascularity is borderline prominent, especially in the lateral projection.  There is mild chronic peribronchial thickening.  No focal airspace disease, effusion, or pneumothorax. No acute osseous abnormality identified.  IMPRESSION: Borderline cardiomegaly and mild pulmonary vascular congestion.   Original Report Authenticated By: Britta Mccreedy, M.D.   Ct Head Wo Contrast  01/13/2013   *RADIOLOGY REPORT*  Clinical Data: Dizziness  CT HEAD WITHOUT CONTRAST  Technique:  Contiguous axial images were obtained from the base of the skull through the vertex without contrast.  Comparison: 09/19/2006  Findings: Moderate global atrophy.  Chronic ischemic changes in the periventricular white matter.  Lacunar infarcts in the left thalamus and right basal ganglia.  No mass effect or midline shift, or acute intracranial hemorrhage.  Minimal fluid in the dependent mastoid air cells.  Visualized paranasal sinuses are clear. Cranium is intact.  IMPRESSION: Minimal fluid in the mastoid air cells.  Chronic ischemic changes and atrophy are noted.   Original Report Authenticated By: Jolaine Click, M.D.   US Carotid Duplex Bilateral  01/14/2013   *RADIOLOGY REPORT*  Clinical Data: Lightheadedness  BILATERAL CAROTID DUPLEX ULTRASOUND  Findings:  RIGHT CAROTID  ARTERY: Mild plaque in the right common and external carotid arteries.  The right internal carotid artery is occluded just above the bulb.  RIGHT VERTEBRAL ARTERY:  Antegrade.  LEFT CAROTID ARTERY: There is moderate calcified plaque in the bulb.  Low resistance internal carotid Doppler pattern with sharp upstroke.  Moderate tortuosity of this vessel.  LEFT VERTEBRAL ARTERY:  Antegrade with a low resistance Doppler wave form.  IMPRESSION: Right internal carotid artery occlusion.  50-69% stenosis in the left internal carotid artery.   Original Report Authenticated By: Jolaine Click, M.D.   EKG: NSR with 1st degree AV block. HR 89 bpm. Telemetry: Sinus bradycardia with 4.12 second pause at 7:30 this am. HR-ranging from 60's to 30';s.  ASSESSMENT AND PLAN:   1.  Dizziness.  Etiology of dizziness is not clear.  Began abruptly about 1 week ago.  Complains of dizziness with immediate standing  Once up for a bit is OK, going against signif orthostasis  Not dizzy with walking , going against symptomatic bradycardia  MRI this AM, results pending  Would check orthostatics again (were neg in Dr Scharlene Gloss office last wk).  Would also follow HR on tele while walking halls.  Follow any symptoms.  2.  Bradycardia.  Patient's HR is 30 to 60s  Average in 50s/60s.  Longest pause was 4.1 sec at 7:30 this AM  Would follow  Check TSH  No clear indication for PPM at this time.  2. Carotid Artery Disease: Abnormal carotid duplex study demonstrating RICA occlusion, with 50-69% stenosis of the LICA. This is chronic, and was found during evaluation in October 2007 and has been followed by Dr. Darrick Penna, [facility]. Awaiting MRI results.  3. CAD: Review of cardiac catheterization report completed in October 2008, this demonstrated single vessel obstructive atherosclerotic disease with occlusion of the second diagonal branch. The left main had 10% with mild irregularities, the LAD had diffuse  disease of 40-50% in the mid vessel, first diagonal  60-70% stenosis at the takeoff. The left circumflex had scattered irregularities equal to 20-30%. Right coronary artery was dominant with scattered irregularities diffusely 20%. LV EF at that time was 55%. He was continued on medical management. He is allergic to aspirin.  4. AAA: Patient had repair of abdominal aortic aneurysm in October of 2007. He is followed by Dr. Leonette Most fields in [place].  5. Hypothyroidism: Patient is followed by primary care physician and is on levothyroxine 150 mg daily. As stated above we will check TSH for continuing factor for bradycardia.  5. COPD: By history, nonlimiting based upon his activity level.     Signed: Bettey Mare. Lyman Bishop NP [facility] 01/15/2013, 9:28 AM Co-Sign MD  Patient seen and examined  I have amended Consult note by Harriet Pho with my findings  Agree with plan we have noted above.

## 2013-01-15 NOTE — Care Management Note (Signed)
    Page 1 of 1   01/15/2013     3:10:08 PM   CARE MANAGEMENT NOTE 01/15/2013  Patient:  Luis Guerra, Luis Guerra   Account Number:  [id number]  Date Initiated:  01/15/2013  Documentation initiated by:  Sharrie Rothman  Subjective/Objective Assessment:   Pt admitted from home with dizziness and bradycardia. Pt lives with his wife and will return home at discharge. Pt is independent and still mows his grass.     Action/Plan:   Pt discharged home today. No CM needs noted.   Anticipated DC Date:  01/15/2013   Anticipated DC Plan:  HOME/SELF CARE      DC Planning Services  CM consult      Choice offered to / List presented to:             Status of service:  Completed, signed off Medicare Important Message given?  YES (If response is "NO", the following Medicare IM given date fields will be blank) Date Medicare IM given:  01/15/2013 Date Additional Medicare IM given:    Discharge Disposition:  HOME/SELF CARE  Per UR Regulation:    If discussed at Long Length of Stay Meetings, dates discussed:    Comments:  01/15/13 1510 Arlyss Queen, RN BSN CM

## 2013-01-15 NOTE — Progress Notes (Signed)
Patient not orthostatic though dizzy with initial standing per nursing report. HR with walking went into 80s  Patient walked with PT  Was not symptomatic Echo with normal LV systolic function.  At present I am not convinced patient symptomatic from bradycardia, but I would continue telemetry Note MRI with intracranial vessel disease  Consider neuro evaluation.

## 2013-01-15 NOTE — Progress Notes (Signed)
*  PRELIMINARY RESULTS* Echocardiogram 2D Echocardiogram has been performed.  Conrad [place] 01/15/2013, 12:05 PM

## 2013-01-15 NOTE — Evaluation (Signed)
Physical Therapy Evaluation Patient Details Name: Luis Guerra MRN: [id number] DOB: 1921-06-10 Today's Date: 01/15/2013 Time: 4098-1191 PT Time Calculation (min): 21 min  PT Assessment / Plan / Recommendation Clinical Impression  Pt was seen for evaluation.  He had no strength, balance or coordination dysfunction noted.  HR was recorded during gait in the hallway.  Pt did not report any significant lightheadedness during change of position or gait.    PT Assessment  Patent does not need any further PT services    Follow Up Recommendations  No PT follow up    Does the patient have the potential to tolerate intense rehabilitation      Barriers to Discharge        Equipment Recommendations  None recommended by PT    Recommendations for Other Services     Frequency      Precautions / Restrictions Precautions Precautions: None Restrictions Weight Bearing Restrictions: No   Pertinent Vitals/Pain       Mobility  Bed Mobility Bed Mobility: Supine to Sit;Sit to Supine Supine to Sit: 7: Independent;HOB flat Sit to Supine: 7: Independent;HOB flat Transfers Transfers: Sit to Stand;Stand to Sit Sit to Stand: 7: Independent;From bed;With upper extremity assist Stand to Sit: 7: Independent;To bed;Without upper extremity assist Ambulation/Gait Ambulation/Gait Assistance: 7: Independent Ambulation Distance (Feet): 400 Feet Assistive device: None Gait Pattern: Within Functional Limits Gait velocity: rapid pace General Gait Details: HR was recorded during gait per MD request.  Pt had no significant lightheadedness reported during gait.  Gait was done on level and on a slope.  He had no fatigue or LOB. Stairs: No Wheelchair Mobility Wheelchair Mobility: No    Exercises     PT Diagnosis:    PT Problem List:   PT Treatment Interventions:     PT Goals    Visit Information  Last PT Received On: 01/15/13    Subjective Data  Subjective: I feel fine Patient Stated Goal:  return home   Prior Functioning  Home Living Lives With: Spouse Available Help at Discharge: Family;Available 24 hours/day Type of Home: House Home Access: Stairs to enter [employer] of Steps: 4 Entrance Stairs-Rails: Right;Left;Can reach both Home Layout: One level Firefighter: Standard Home Adaptive Equipment: Straight cane Prior Function Level of Independence: Independent Able to Take Stairs?: Yes Driving: Yes Vocation: Retired Musician: No difficulties    Copywriter, advertising Arousal/Alertness: Awake/alert Behavior During Therapy: WFL for tasks assessed/performed Overall Cognitive Status: Within Functional Limits for tasks assessed    Extremity/Trunk Assessment Right Upper Extremity Assessment RUE ROM/Strength/Tone: Within functional levels Left Upper Extremity Assessment LUE ROM/Strength/Tone: Within functional levels Right Lower Extremity Assessment RLE ROM/Strength/Tone: Within functional levels RLE Sensation: WFL - Light Touch RLE Coordination: WFL - gross motor Left Lower Extremity Assessment LLE ROM/Strength/Tone: Within functional levels LLE Sensation: WFL - Light Touch LLE Coordination: WFL - gross motor   Balance Balance Balance Assessed: Yes (on functional observation)  End of Session PT - End of Session Equipment Utilized During Treatment: Gait belt Activity Tolerance: Patient tolerated treatment well Patient left: in bed Nurse Communication: Mobility status  GP     Luis Guerra L 01/15/2013, 11:29 AM

## 2013-01-16 DIAGNOSIS — I498 Other specified cardiac arrhythmias: Secondary | ICD-10-CM

## 2013-01-16 DIAGNOSIS — R001 Bradycardia, unspecified: Secondary | ICD-10-CM

## 2013-01-16 NOTE — Consult Note (Signed)
[facility] [name] [name], MD     [email or web address]          Luis Guerra is an 79 y.o. male.   ASSESSMENT/PLAN: 1. Vertiginous symptoms that is posturally related. The mechanism seem to suggest orthostatic hypotension although this has not been borne out consistently. Medication effect is likely also contributing. The patient does have intracranial occlusive disease but this is unlikely to explain the patient's symptoms. 2. Asymptomatic right intracranial carotid occlusion. Antiplatelet agent is recommended. Plavix is recommended given that he is allergic to aspirin. 3. Small intracranial aneurysm. The 10 year risk of bleeding is low. Given his life expectancy, no further workup is suggested.  The patient is a 79 year old white male who apparently is quite functional and vigorous at baseline. He does have a history of hypertension. The patient reports over week ago he developed the gradual onset of vertiginous symptoms described as a spinning sensation but only present on standing. He last a couple minutes on initially standing. The patient went to see his primary care provider who discontinued his antihypertensive medications. The patient reports however that the dizziness persisted and in fact may have gotten worse. He probably stumbled and almost fell to the ground. This resulted in the patient's wife continued him to the emergency room for further evaluation. The patient denies any focal numbness, weakness, dysarthria, diplopia, dysphagia, chest pain or shortness of breath. There are no GI or GU symptoms.  GENERAL: The patient is a very pleasant thin man in no acute distress.  HEENT: He does have a hearing aid on the right. Neck is supple. Head is normocephalic atraumatic.  ABDOMEN: soft  EXTREMITIES: No edema. He does have appropriate arthritic changes of the fingers bilaterally.  BACK: Unremarkable.  SKIN: Normal by inspection.    MENTAL STATUS: Alert and  oriented. Speech, language and cognition are generally intact. Judgment and insight normal.   CRANIAL NERVES: Pupils are equal, round and reactive to light and accommodation; extra ocular movements are full, there is no significant nystagmus; visual fields are full; upper and lower facial muscles are normal in strength and symmetric, there is no flattening of the nasolabial folds; tongue is midline; uvula is midline; shoulder elevation is normal.  MOTOR: Normal tone, bulk and strength; no pronator drift.  COORDINATION: Left finger to nose is normal, right finger to nose is normal, No rest tremor; no intention tremor; no postural tremor; no bradykinesia.  REFLEXES: Deep tendon reflexes are symmetrical and normal. Plantar responses are flexor bilaterally.   SENSATION: Normal to light touch.  GAIT: Normal.    Past Medical History  Diagnosis Date  . Coronary artery disease     WITH HISTORY OF OCCLUSION OF A DIAGONAL BRANCH  . SBO (small bowel obstruction)   . Carotid occlusion, right   . COPD (chronic obstructive pulmonary disease)   . Gastritis   . AAA (abdominal aortic aneurysm)   . Hiatal hernia   . Weight loss   . Dysphagia   . Cancer   . Hypothyroidism     Past Surgical History  Procedure Laterality Date  . Cardiac catheterization  06/08/2007    EF 55%  . Abdominal aortic aneurysm repair    . Cardiovascular stress test  05/03/2006    EF 60%  . Upper gastrointestinal endoscopy  07/13/06  . Upper gastrointestinal endoscopy  02/19/05  . Upper gastrointestinal endoscopy  07/22/2010    EGD ED  . Colonoscopy  07/22/2010  . Hernia repair    .  Esophagogastroduodenoscopy  05/26/2011    Procedure: ESOPHAGOGASTRODUODENOSCOPY (EGD);  Surgeon: Malissa Hippo, MD;  Location: [facility];  Service: Endoscopy;  Laterality: N/A;  3:00  . Balloon dilation  05/26/2011    Procedure: BALLOON DILATION;  Surgeon: Malissa Hippo, MD;  Location: [facility];  Service: Endoscopy;  Laterality:  N/A;  . Esophagogastroduodenoscopy  07/21/2012    Procedure: ESOPHAGOGASTRODUODENOSCOPY (EGD);  Surgeon: Malissa Hippo, MD;  Location: [facility];  Service: Endoscopy;  Laterality: N/A;  325-changed to 225 Ann to notify pt  . Cholecystectomy    . Low back surgery      Family History  Problem Relation Age of Onset  . Heart disease Father   . Healthy Sister   . Heart disease Sister   . Stroke Sister   . Stroke Daughter   . Diabetes Daughter   . Healthy Son     Social History:  reports that he quit smoking about 55 years ago. He has never used smokeless tobacco. He reports that he does not drink alcohol or use illicit drugs.  Allergies:  Allergies  Allergen Reactions  . Aspirin Swelling  . Esomeprazole Magnesium Diarrhea  . Niacin Swelling  . Penicillins Swelling    Medications: Prior to Admission medications   Medication Sig Start Date End Date Taking? Authorizing Provider  acetaminophen (TYLENOL) 500 MG tablet Take 500 mg by mouth 2 (two) times daily. For pain   Yes Historical Provider, MD  Fluticasone-Salmeterol (ADVAIR DISKUS) 250-50 MCG/DOSE AEPB Inhale 1 puff into the lungs every 12 (twelve) hours.     Yes Historical Provider, MD  levothyroxine (SYNTHROID, LEVOTHROID) 125 MCG tablet Take 125 mcg by mouth daily.     Yes Historical Provider, MD  Multiple Vitamin (MULTIVITAMIN) tablet Take 1 tablet by mouth daily.     Yes Historical Provider, MD  pantoprazole (PROTONIX) 40 MG tablet Take 1 tablet (40 mg total) by mouth daily. 07/21/12  Yes Malissa Hippo, MD  Probiotic Product (ALIGN) 4 MG CAPS Take 4 mg by mouth daily.    Yes Historical Provider, MD  theophylline (THEOPHYLLINE) 300 MG 12 hr tablet Take 300 mg by mouth 2 (two) times daily.     Yes Historical Provider, MD  losartan (COZAAR) 50 MG tablet Take 50 mg by mouth daily.      Historical Provider, MD    Scheduled Meds: . acetaminophen  500 mg Oral BID  . enoxaparin (LOVENOX) injection  40 mg Subcutaneous Q24H    . levothyroxine  125 mcg Oral QAC breakfast  . mometasone-formoterol  2 puff Inhalation BID  . pantoprazole  40 mg Oral Daily  . theophylline  300 mg Oral BID   Continuous Infusions:  PRN Meds:.ondansetron (ZOFRAN) IV  Blood pressure 120/53, pulse 55, temperature 97.8 F (36.6 C), temperature source Oral, resp. rate 16, height 6' (1.829 m), weight 62.687 kg (138 lb 3.2 oz), SpO2 97.00%.   No results found for this or any previous visit (from the past 48 hour(s)).  Mr Maxine Glenn Head Wo Contrast  01/15/2013   *RADIOLOGY REPORT*  Clinical Data:  Dizziness.  MRI BRAIN WITHOUT CONTRAST MRA HEAD WITHOUT CONTRAST  Technique: Multiplanar, multiecho pulse sequences of the brain and surrounding structures were obtained according to standard protocol without intravenous contrast.  Angiographic images of the head were obtained using MRA technique without contrast.  Comparison: 01/13/2013 head CT.  MRI HEAD  Findings:  No acute infarct.  Small vessel disease type changes.  Global atrophy  without hydrocephalus.  No intracranial hemorrhage.  No intracranial mass lesion detected on this unenhanced exam.  Occluded right internal carotid artery.  Please see below.  Mild transverse ligament hypertrophy.  Mild paranasal sinus mucosal thickening.  IMPRESSION: No acute infarct.  Small vessel disease type changes.  Global atrophy.  Mild paranasal sinus mucosal thickening.  Occluded right internal carotid artery.  Please see below.  MRA HEAD  Findings: The right internal carotid artery is occluded.  Collateral flow to the right carotid terminus.  Artifact versus focal stenosis proximal M1 segment middle cerebral artery bilaterally.  Mild narrowing and irregularity cavernous segment left internal carotid artery.  3.9 mm outpouching medial aspect right internal carotid artery proximal cavernous segment suggestive of small aneurysm as versus result of atherosclerotic type changes.  Marked narrowing proximal A2 segment right  anterior cerebral artery.  Middle cerebral artery moderate branch vessel irregularity bilaterally.  Right vertebral artery is dominant.  Left vertebral artery is small after the takeoff of the left PICA.  Mild narrowing mid aspect of the basilar artery.  Nonvisualization left AICA.  Narrowed irregular right AICA.  Mild irregularity of the superior cerebellar artery bilaterally.  Moderate to marked tandem stenosis with poor delineation of a majority of the right posterior cerebral artery.  Left posterior cerebral artery and moderate to marked distal branch vessel irregularity.  Slight bulge of the basilar tip just beyond the takeoff of the right superior cerebral artery may be related to fetal type origin of the right posterior cerebral artery.  IMPRESSION: Occluded right internal carotid artery.  Poor delineation of a majority of the right posterior cerebral artery.  Portion visualized is markedly narrowed and irregular.  High-grade stenosis proximal A2 segment right anterior cerebral artery.  Branch vessel irregularity.  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   posterior cerebral artery.   Original Report Authenticated By: Lacy Duverney, M.D.   Mr Brain Wo Contrast  01/15/2013   *RADIOLOGY REPORT*  Clinical Data:  Dizziness.  MRI BRAIN WITHOUT CONTRAST MRA HEAD WITHOUT CONTRAST  Technique: Multiplanar, multiecho pulse sequences of the brain and surrounding structures were obtained according to standard protocol without intravenous contrast.  Angiographic images of the head were obtained using MRA technique without contrast.  Comparison: 01/13/2013 head CT.  MRI HEAD  Findings:  No acute infarct.  Small vessel disease type changes.  Global atrophy without hydrocephalus.  No intracranial hemorrhage.  No intracranial mass lesion detected on this unenhanced exam.  Occluded right internal carotid artery.  Please  see below.  Mild transverse ligament hypertrophy.  Mild paranasal sinus mucosal thickening.  IMPRESSION: No acute infarct.  Small vessel disease type changes.  Global atrophy.  Mild paranasal sinus mucosal thickening.  Occluded right internal carotid artery.  Please see below.  MRA HEAD  Findings: The right internal carotid artery is occluded.  Collateral flow to the right carotid terminus.  Artifact versus focal stenosis proximal M1 segment middle cerebral artery bilaterally.  Mild narrowing and irregularity cavernous segment left internal carotid artery.  3.9 mm outpouching medial aspect right internal carotid artery proximal cavernous segment suggestive of small aneurysm as versus result of atherosclerotic type changes.  Marked narrowing proximal A2 segment right anterior cerebral artery.  Middle cerebral artery moderate branch vessel irregularity bilaterally.  Right vertebral artery is dominant.  Left vertebral artery is small after the takeoff of the left PICA.  Mild narrowing mid aspect of  the basilar artery.  Nonvisualization left AICA.  Narrowed irregular right AICA.  Mild irregularity of the superior cerebellar artery bilaterally.  Moderate to marked tandem stenosis with poor delineation of a majority of the right posterior cerebral artery.  Left posterior cerebral artery and moderate to marked distal branch vessel irregularity.  Slight bulge of the basilar tip just beyond the takeoff of the right superior cerebral artery may be related to fetal type origin of the right posterior cerebral artery.  IMPRESSION: Occluded right internal carotid artery.  Poor delineation of a majority of the right posterior cerebral artery.  Portion visualized is markedly narrowed and irregular.  High-grade stenosis proximal A2 segment right anterior cerebral artery.  Branch vessel irregularity.  Small aneurysm medial aspect left internal carotid artery proximal segment measuring up to 3.9 mm versus result of atherosclerotic  type changes.   posterior cerebral artery.   Original Report Authenticated By: Lacy Duverney, M.D.   US Carotid Duplex Bilateral  01/14/2013   *RADIOLOGY REPORT*  Clinical Data: Lightheadedness  BILATERAL CAROTID DUPLEX ULTRASOUND  Technique: Wallace Cullens scale imaging, color Doppler and duplex ultrasound was performed of bilateral carotid and vertebral arteries in the neck.  Comparison:  None.  Criteria:  Quantification of carotid stenosis is based on velocity parameters that correlate the residual internal carotid diameter with NASCET-based stenosis levels, using the diameter of the distal internal carotid lumen as the denominator for stenosis measurement.  The following velocity measurements were obtained:                   PEAK SYSTOLIC/END DIASTOLIC RIGHT ICA:                        Occludedcm/sec CCA:                        86cm/sec SYSTOLIC ICA/CCA RATIO:     Not applicable DIASTOLIC ICA/CCA RATIO: ECA:                        85cm/sec  LEFT ICA:                        150cm/sec CCA:                        95cm/sec SYSTOLIC ICA/CCA RATIO:     1.57 DIASTOLIC ICA/CCA RATIO: ECA:                        75cm/sec  Findings:  RIGHT CAROTID ARTERY: Mild plaque in the right common and external carotid arteries.  The right internal carotid artery is occluded just above the bulb.  RIGHT VERTEBRAL ARTERY:  Antegrade.  LEFT CAROTID ARTERY: There is moderate calcified plaque in the bulb.  Low resistance internal carotid Doppler pattern with sharp upstroke.  Moderate tortuosity of this vessel.  LEFT VERTEBRAL ARTERY:  Antegrade with a low resistance Doppler wave form.  IMPRESSION: Right internal carotid artery occlusion.  50-69% stenosis in the left internal carotid artery.   Original Report Authenticated By: Jolaine Click, M.D.        Adalena Abdulla [name], M.D.  Diplomate, Biomedical engineer of Psychiatry and Neurology ( Neurology). 01/16/2013, 8:24 AM

## 2013-01-16 NOTE — Progress Notes (Signed)
SUBJECTIVE:Feels well. Wants to go home. Dizziness is virtually resolved.  Symptoms are not clearly orthostatic  LABS: Basic Metabolic Panel:  Recent Labs  16/10/96 0624  NA 141  K 3.6  CL 109  CO2 23  GLUCOSE 87  BUN 19  CREATININE 1.04  CALCIUM 8.3*   Liver Function Tests:  Recent Labs  01/14/13 0624  AST 45*  ALT 51  ALKPHOS 101  BILITOT 0.2*  PROT 6.4  ALBUMIN 3.1*     Recent Labs  01/14/13 0624  HGBA1C 5.4   Fasting Lipid Panel:  Recent Labs  01/14/13 0626  CHOL 82  HDL 34*  LDLCALC 39  TRIG 47  CHOLHDL 2.4   Thyroid Function Tests:  Recent Labs  01/13/13 1022  TSH 2.259   RADIOLOGY: Mr Shirlee Latch 01/15/2013    Occluded right internal carotid artery. right posterior cerebral artery- markedly narrowed and irregular.  High-grade stenosis proximal A2 segment right anterior cerebral artery.  Branch vessel irregularity.  Small aneurysm medial aspect left internal carotid artery proximal segment measuring up to 3.9 mm.   US Carotid Duplex Bilateral  01/14/2013    Mild plaque in the right common and external carotid arteries.  The right internal carotid artery is occluded just above the bulb.  There is moderate calcified plaque in the left bulb.  Low resistance internal carotid Doppler pattern with sharp upstroke.  Moderate tortuosity of this vessel.   Antegrade bilateral vertebral flow.   IMPRESSION: Right internal carotid artery occlusion.  50-69% stenosis in the left internal carotid artery.   PHYSICAL EXAM BP 120/53  Pulse 55  Temp(Src) 97.8 F (36.6 C) (Oral)  Resp 16  Ht 6' (1.829 m)  Wt 138 lb 3.2 oz (62.687 kg)  BMI 18.74 kg/m2  SpO2 96% General: Well developed, well nourished, in no acute distress Head: Eyes PERRLA, No xanthomas.   Normal cephalic and atramatic  Lungs: Clear bilaterally to auscultation and percussion. Heart: HRRR S1 S2, No MRG.  Pulses are 2+ & equal.            No carotid bruit. No JVD.  No abdominal bruits. No femoral  bruits. Abdomen: Bowel sounds are positive, abdomen soft and non-tender  Msk:  Back normal, normal gait. Normal strength and tone for age. Extremities: No clubbing, cyanosis or edema.  DP +1 Neuro: Alert and oriented X 3. Psych:  Good affect, responds appropriately  TELEMETRY: Reviewed telemetry pt in sinus rhythm with rates between 36-70 bpm and occasional sinus pauses up to 4 seconds.  ASSESSMENT AND PLAN:  1. Symptomatic Bradycardia: Patient had one episode of asystole with 4.21 sec pause yesterday morning. He has had episodes of dizziness for two weeks. He is not on any AV nodal blocking agents. He has had no other pauses, with HR;s in the 36-72 bpm range over the last 24 hours. Uncertain if bradycardia is cause of dizziness.  Dr. Tenny Craw does not think he is a candidate for a pacemaker at this time. I have made a follow up appointment in our office for June 3rd.   2. Dizziness: Almost completely resolved. Neuro states that symptoms are posturally related.  Cozaar has been discontinued.  BP in the 120's-140;s systolic off medications. Plavix is recommended by Dr. Gerilyn Pilgrim as OP.  3. Carotid Artery Disease: Abnormal carotid duplex, MRI and MRA. demonstrating total RICA occlusion, with 04%-54% of the LICA. He is followed by [facility] as OP, Dr. Darrick Penna.  4. CAD: Single vessel disease with obstructive diease and  occlusion of the second diagonal branch.Medical management only.He is allergic to ASA. Normal LV function per Echo.  Bettey Mare. Lyman Bishop NP [facility] 01/16/2013, 9:45 AM  Cardiology Attending Patient interviewed and examined. Discussed with Joni Reining, NP.  Above note annotated and modified based upon my findings.  Patient has mild residual symptoms that are not clearly orthostatic and her occurrence and has no orthostatic hypotension. Telemetry reviewed, and no pauses longer than 3.1 seconds identified. Patient advised that he has significant conduction system disease, but no  definite indication for pacing at this point. We will continue to monitor symptoms and rhythm as an outpatient.  [place] [facility], MD 01/18/2013, 10:04 AM

## 2013-01-16 NOTE — Discharge Summary (Signed)
Physician Discharge Summary  Luis Guerra GMW:[id number] DOB: Dec 11, 1920 DOA: 01/13/2013  PCP: Luis Pizza, MD  Admit date: 01/13/2013 Discharge date: 01/16/2013  Recommendations for Outpatient Follow-up:  1. Followup lightheadedness, see discussion below 2. Followup intracranial occlusive disease and chronic right carotid occlusion. Although unlikely to explain patient's symptoms, consideration could be given to starting Plavix. This will be deferred to patient's primary care physician.   Follow-up Information   Follow up with Joni Reining, NP On 01/30/2013. (2pm)    Contact information:   [address] [place] [address] [phone]       Follow up with Luis Pizza, MD In 1 week.   [place] [address]      Discharge Diagnoses:  1. Lightheadedness 2. Intracranial occlusive disease 3. Sinus bradycardia with infrequent pauses 4. Possible ataxia, none demonstrated this admission 5. History of right-sided carotid occlusion  Discharge Condition: Improved Disposition: Home  Diet recommendation: Regular  Filed Weights   01/13/13 0916 01/13/13 1205  Weight: 65.772 kg (145 lb) 62.687 kg (138 lb 3.2 oz)    History of present illness:  79 year old man presented to the emergency department with a one-week history of dizziness. Was noted to have some ataxia during ambulation and was referred for further evaluation of lightheadedness and ataxia.  Hospital Course:  [name] was admitted for further evaluation. Given chronicity of his symptoms stroke was doubted, however stroke evaluation was pursued and ultimately unremarkable. MRI of the head did demonstrate intracranial occlusive disease not thought to be symptomatic. He was seen by neurology and recommendations were to consider Plavix. During his hospitalization he is not to have sinus bradycardia, not on any rate control agents. He had one 4 second pause and consideration was given to arrhythmia  induced lightheadedness. He was seen by cardiology and this was felt to be unlikely. He will followup with cardiology in the outpatient setting at which time consideration will be given to an outpatient monitor if his symptoms persist. Patient did very well with physical therapy with no ataxia or weakness and is stable for discharge. He has been counseled in slow positional changes.   1. Lightheadedness: Continues to improve. Etiology unclear. Initial suspicion was for vasovagal/or stasis possibly related to blood pressure medications. Symptoms do not suggest vertigo. Sinus bradycardia with pause noted but not felt to be clinically significant per cardiology. Will followup with cardiology as an outpatient. Liberalize blood pressure control. MRI brain negative, no evidence of stroke. MRA head reveals no abnormalities to explain chronic persistent lightheadedness. Anaphylaxis to aspirin. Patient will discuss Plavix with Dr. Margo Aye. 2. Possible ataxia: None demonstrated during hospitalization.  3. Mild transaminase elevation: Unclear significance. Nearly resolved. Followup as an outpatient. Hypoglycemia resolved 4. History of right-sided carotid occlusion: Followed chronically. 5. History of COPD: Stable. Theophylline level subtherapeutic. Continue theophylline. 6. Hypertension: Stable. Blood pressure medications have been completely discontinued. Consultants:  Cardiology  Physical therapy: No followup. Procedures:  2-D echocardiogram: LVEF 55-60%.   Discharge Instructions  Discharge Orders   Future Appointments Provider Department Dept Phone   01/30/2013 2:00 PM Jodelle Gross, NP [facility] [phone]   03/12/2013 10:30 AM Malissa Hippo, MD [facility] [phone]   Future Orders Complete By Expires     Diet general  As directed     Discharge instructions  As directed     Comments:      Make slow positional changes. Lie down if you feel  lightheaded. Call your physician or seek immediate medical attention for worsening dizziness, weakness, trouble breathing or worsening of your condition.    Increase activity slowly  As directed         Medication List    STOP taking these medications       losartan 50 MG tablet  Commonly known as:  COZAAR      TAKE these medications       acetaminophen 500 MG tablet  Commonly known as:  TYLENOL  Take 500 mg by mouth 2 (two) times daily. For pain     ADVAIR DISKUS 250-50 MCG/DOSE Aepb  Generic drug:  Fluticasone-Salmeterol  Inhale 1 puff into the lungs every 12 (twelve) hours.     ALIGN 4 MG Caps  Take 4 mg by mouth daily.     levothyroxine 125 MCG tablet  Commonly known as:  SYNTHROID, LEVOTHROID  Take 125 mcg by mouth daily.     multivitamin tablet  Take 1 tablet by mouth daily.     pantoprazole 40 MG tablet  Commonly known as:  PROTONIX  Take 1 tablet (40 mg total) by mouth daily.     theophylline 300 MG 12 hr tablet  Commonly known as:  THEODUR  Take 300 mg by mouth 2 (two) times daily.       Allergies  Allergen Reactions  . Aspirin Swelling  . Esomeprazole Magnesium Diarrhea  . Niacin Swelling  . Penicillins Swelling    The results of significant diagnostics from this hospitalization (including imaging, microbiology, ancillary and laboratory) are listed below for reference.    Significant Diagnostic Studies: Dg Chest 2 View  01/13/2013   *RADIOLOGY REPORT*  Clinical Data: Dizziness and weak  CHEST - 2 VIEW  Comparison: This chest radiograph 10/24/2007  Findings: Cardiac leads project over the chest.  Heart size is borderline enlarged and likely accentuated by apical lordotic technique.  The thoracic aorta contour is tortuous and stable. Pulmonary vascularity is borderline prominent, especially in the lateral projection.  There is mild chronic peribronchial thickening.  No focal airspace disease, effusion, or pneumothorax. No acute osseous abnormality  identified.  IMPRESSION: Borderline cardiomegaly and mild pulmonary vascular congestion.   Original Report Authenticated By: Britta Mccreedy, M.D.   Ct Head Wo Contrast  01/13/2013   *RADIOLOGY REPORT*  Clinical Data: Dizziness  CT HEAD WITHOUT CONTRAST  Technique:  Contiguous axial images were obtained from the base of the skull through the vertex without contrast.  Comparison: 09/19/2006  Findings: Moderate global atrophy.  Chronic ischemic changes in the periventricular white matter.  Lacunar infarcts in the left thalamus and right basal ganglia.  No mass effect or midline shift, or acute intracranial hemorrhage.  Minimal fluid in the dependent mastoid air cells.  Visualized paranasal sinuses are clear. Cranium is intact.  IMPRESSION: Minimal fluid in the mastoid air cells.  Chronic ischemic changes and atrophy are noted.   Original Report Authenticated By: Jolaine Click, M.D.   Mr [facility] Wo Contrast  01/15/2013   *RADIOLOGY REPORT*  Clinical Data:  Dizziness.  MRI BRAIN WITHOUT CONTRAST MRA HEAD WITHOUT CONTRAST  Technique: Multiplanar, multiecho pulse sequences of the brain and surrounding structures were obtained according to standard protocol without intravenous contrast.  Angiographic images of the head were obtained using MRA technique without contrast.  Comparison: 01/13/2013 head CT.  MRI HEAD  Findings:  No acute infarct.  Small vessel disease type changes.  Global atrophy without hydrocephalus.  No intracranial hemorrhage.  No intracranial  mass lesion detected on this unenhanced exam.  Occluded right internal carotid artery.  Please see below.  Mild transverse ligament hypertrophy.  Mild paranasal sinus mucosal thickening.  IMPRESSION: No acute infarct.  Small vessel disease type changes.  Global atrophy.  Mild paranasal sinus mucosal thickening.  Occluded right internal carotid artery.  Please see below.  MRA HEAD  Findings: The right internal carotid artery is occluded.  Collateral flow to the  right carotid terminus.  Artifact versus focal stenosis proximal M1 segment middle cerebral artery bilaterally.  Mild narrowing and irregularity cavernous segment left internal carotid artery.  3.9 mm outpouching medial aspect right internal carotid artery proximal cavernous segment suggestive of small aneurysm as versus result of atherosclerotic type changes.  Marked narrowing proximal A2 segment right anterior cerebral artery.  Middle cerebral artery moderate branch vessel irregularity bilaterally.  Right vertebral artery is dominant.  Left vertebral artery is small after the takeoff of the left PICA.  Mild narrowing mid aspect of the basilar artery.  Nonvisualization left AICA.  Narrowed irregular right AICA.  Mild irregularity of the superior cerebellar artery bilaterally.  Moderate to marked tandem stenosis with poor delineation of a majority of the right posterior cerebral artery.  Left posterior cerebral artery and moderate to marked distal branch vessel irregularity.  Slight bulge of the basilar tip just beyond the takeoff of the right superior cerebral artery may be related to fetal type origin of the right posterior cerebral artery.  IMPRESSION: Occluded right internal carotid artery.  Poor delineation of a majority of the right posterior cerebral artery.  Portion visualized is markedly narrowed and irregular.  High-grade stenosis proximal A2 segment right anterior cerebral artery.  Branch vessel irregularity.  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   posterior cerebral artery.   Original Report Authenticated By: Lacy Duverney, M.D.   US Carotid Duplex Bilateral  01/14/2013   *RADIOLOGY REPORT*  Clinical Data: Lightheadedness  BILATERAL CAROTID DUPLEX ULTRASOUND  Technique: Wallace Cullens scale imaging, color Doppler and duplex ultrasound was performed of bilateral carotid and vertebral arteries in the neck.   Comparison:  None.  Criteria:  Quantification of carotid stenosis is based on velocity parameters that correlate the residual internal carotid diameter with NASCET-based stenosis levels, using the diameter of the distal internal carotid lumen as the denominator for stenosis measurement.  The following velocity measurements were obtained:                   PEAK SYSTOLIC/END DIASTOLIC RIGHT ICA:                        Occludedcm/sec CCA:                        86cm/sec SYSTOLIC ICA/CCA RATIO:     Not applicable DIASTOLIC ICA/CCA RATIO: ECA:                        85cm/sec  LEFT ICA:                        150cm/sec CCA:                        95cm/sec SYSTOLIC ICA/CCA RATIO:     1.57 DIASTOLIC ICA/CCA RATIO: ECA:  75cm/sec  Findings:  RIGHT CAROTID ARTERY: Mild plaque in the right common and external carotid arteries.  The right internal carotid artery is occluded just above the bulb.  RIGHT VERTEBRAL ARTERY:  Antegrade.  LEFT CAROTID ARTERY: There is moderate calcified plaque in the bulb.  Low resistance internal carotid Doppler pattern with sharp upstroke.  Moderate tortuosity of this vessel.  LEFT VERTEBRAL ARTERY:  Antegrade with a low resistance Doppler wave form.  IMPRESSION: Right internal carotid artery occlusion.  50-69% stenosis in the left internal carotid artery.   Original Report Authenticated By: Jolaine Click, M.D.    Labs: Basic Metabolic Panel:  Recent Labs Lab 01/13/13 [phone] 01/14/13 0624  NA 137 141  K 3.5 3.6  CL 102 109  CO2 23 23  GLUCOSE 69* 87  BUN 21 19  CREATININE 1.11 1.04  CALCIUM 9.0 8.3*   Liver Function Tests:  Recent Labs Lab 01/13/13 0922 01/14/13 0624  AST 79* 45*  ALT 64* 51  ALKPHOS 124* 101  BILITOT 0.2* 0.2*  PROT 7.8 6.4  ALBUMIN 3.8 3.1*   CBC:  Recent Labs Lab 01/13/13 0922  WBC 8.4  NEUTROABS 6.4  HGB 14.5  HCT 44.7  MCV 93.3  PLT 194   Cardiac Enzymes:  Recent Labs Lab 01/13/13 0922  TROPONINI <0.30    CBG:  Recent Labs Lab 01/13/13 0934  GLUCAP 85    Principal Problem:   Lightheaded Active Problems:   Carotid occlusion, right   Lightheadedness   Ataxia   Elevated transaminase level   Time coordinating discharge: 25 minutes   Signed:  Brendia Sacks, MD [facility] 01/16/2013, 11:26 AM

## 2013-01-16 NOTE — Progress Notes (Signed)
Patient with orders to be discharge home. Discharge instruction given, patient verbalized understanding. Patient in stable condition upon discharge. Patient left with family in private vehicle.

## 2013-01-16 NOTE — Progress Notes (Signed)
[facility] PROGRESS NOTE  BRADIE LACOCK ZOX:[id number] DOB: 01/12/1921 DOA: 01/13/2013 PCP: Catalina Pizza, MD  Assessment/Plan: 1. Lightheadedness: Continues to improve. Etiology unclear. Initial suspicion was for vasovagal/or stasis possibly related to blood pressure medications. Symptoms do not suggest vertigo. Sinus bradycardia with pause noted but not felt to be clinically significant per cardiology. We will followup with cardiology as an outpatient. Liberalize blood pressure control. MRI brain negative, no evidence of stroke. MRA head reveals no abnormalities to explain chronic persistent lightheadedness. Anaphylaxis to aspirin. Patient will discuss Plavix with Dr. Margo Aye. 2. Possible ataxia: None demonstrated during hospitalization.  3. Mild transaminase elevation: Unclear significance. Nearly resolved. Followup as an outpatient. Hypoglycemia resolved 4. History of right-sided carotid occlusion: Followed chronically. 5. History of COPD: Stable. Theophylline level subtherapeutic. Continue theophylline. 6. Hypertension: Stable. Blood pressure medications have been completely discontinued.   Home today  Code Status: Full code  DVT prophylaxis: Enoxaparin  Family Communication: Discussed with wife at bedside Disposition Plan: as above  Brendia Sacks, MD  [facility]  Pager [phone] If 7PM-7AM, please contact night-coverage at [email or web address], password [facility] 01/16/2013, 11:19 AM  LOS: 3 days   Brief narrative: 79 year old man presented to the emergency department with a one-week history of dizziness. Was noted to have some ataxia during ambulation and was referred for further evaluation of lightheadedness and ataxia.  Consultants:  Cardiology  Physical therapy: No followup.  Procedures:  2-D echocardiogram: LVEF 55-60%.  HPI/Subjective: Feels well. Dizziness has improved. Wants to go home.  Objective: Filed Vitals:   01/15/13 2100 01/16/13 0216 01/16/13 0500  01/16/13 0854  BP: 149/76 125/70 120/53   Pulse: 51 56 55   Temp: 97.4 F (36.3 C) 97.8 F (36.6 C) 97.8 F (36.6 C)   TempSrc: Oral Oral Oral   Resp: 20 16 16    Height:      Weight:      SpO2: 97% 93% 97% 96%    Intake/Output Summary (Last 24 hours) at 01/16/13 1119 Last data filed at 01/16/13 0100  Gross per 24 hour  Intake    480 ml  Output    850 ml  Net   -370 ml   Filed Weights   01/13/13 0916 01/13/13 1205  Weight: 65.772 kg (145 lb) 62.687 kg (138 lb 3.2 oz)    Exam:  General:  Appears calm and comfortable. Sitting in chair. Cardiovascular: RRR, no m/r/g. No LE edema. Telemetry: Sinus bradycardia no pauses greater than 2 seconds. Respiratory: CTA bilaterally, no w/r/r. Normal respiratory effort. Psychiatric: grossly normal mood and affect, speech fluent and appropriate  Data Reviewed: Basic Metabolic Panel:  Recent Labs Lab 01/13/13 0922 01/14/13 0624  NA 137 141  K 3.5 3.6  CL 102 109  CO2 23 23  GLUCOSE 69* 87  BUN 21 19  CREATININE 1.11 1.04  CALCIUM 9.0 8.3*   Liver Function Tests:  Recent Labs Lab 01/13/13 0922 01/14/13 0624  AST 79* 45*  ALT 64* 51  ALKPHOS 124* 101  BILITOT 0.2* 0.2*  PROT 7.8 6.4  ALBUMIN 3.8 3.1*   CBC:  Recent Labs Lab 01/13/13 0922  WBC 8.4  NEUTROABS 6.4  HGB 14.5  HCT 44.7  MCV 93.3  PLT 194   Cardiac Enzymes:  Recent Labs Lab 01/13/13 0922  TROPONINI <0.30   CBG:  Recent Labs Lab 01/13/13 0934  GLUCAP 85     Studies: Mr Maxine Glenn Head Wo Contrast  01/15/2013   *RADIOLOGY REPORT*  Clinical Data:  Dizziness.  MRI BRAIN WITHOUT CONTRAST MRA HEAD WITHOUT CONTRAST  Technique: Multiplanar, multiecho pulse sequences of the brain and surrounding structures were obtained according to standard protocol without intravenous contrast.  Angiographic images of the head were obtained using MRA technique without contrast.  Comparison: 01/13/2013 head CT.  MRI HEAD  Findings:  No acute infarct.  Small vessel  disease type changes.  Global atrophy without hydrocephalus.  No intracranial hemorrhage.  No intracranial mass lesion detected on this unenhanced exam.  Occluded right internal carotid artery.  Please see below.  Mild transverse ligament hypertrophy.  Mild paranasal sinus mucosal thickening.  IMPRESSION: No acute infarct.  Small vessel disease type changes.  Global atrophy.  Mild paranasal sinus mucosal thickening.  Occluded right internal carotid artery.  Please see below.  MRA HEAD  Findings: The right internal carotid artery is occluded.  Collateral flow to the right carotid terminus.  Artifact versus focal stenosis proximal M1 segment middle cerebral artery bilaterally.  Mild narrowing and irregularity cavernous segment left internal carotid artery.  3.9 mm outpouching medial aspect right internal carotid artery proximal cavernous segment suggestive of small aneurysm as versus result of atherosclerotic type changes.  Marked narrowing proximal A2 segment right anterior cerebral artery.  Middle cerebral artery moderate branch vessel irregularity bilaterally.  Right vertebral artery is dominant.  Left vertebral artery is small after the takeoff of the left PICA.  Mild narrowing mid aspect of the basilar artery.  Nonvisualization left AICA.  Narrowed irregular right AICA.  Mild irregularity of the superior cerebellar artery bilaterally.  Moderate to marked tandem stenosis with poor delineation of a majority of the right posterior cerebral artery.  Left posterior cerebral artery and moderate to marked distal branch vessel irregularity.  Slight bulge of the basilar tip just beyond the takeoff of the right superior cerebral artery may be related to fetal type origin of the right posterior cerebral artery.  IMPRESSION: Occluded right internal carotid artery.  Poor delineation of a majority of the right posterior cerebral artery.  Portion visualized is markedly narrowed and irregular.  High-grade stenosis proximal A2  segment right anterior cerebral artery.  Branch vessel irregularity.  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   posterior cerebral artery.   Original Report Authenticated By: Lacy Duverney, M.D.   Mr Brain Wo Contrast  01/15/2013   *RADIOLOGY REPORT*  Clinical Data:  Dizziness.  MRI BRAIN WITHOUT CONTRAST MRA HEAD WITHOUT CONTRAST  Technique: Multiplanar, multiecho pulse sequences of the brain and surrounding structures were obtained according to standard protocol without intravenous contrast.  Angiographic images of the head were obtained using MRA technique without contrast.  Comparison: 01/13/2013 head CT.  MRI HEAD  Findings:  No acute infarct.  Small vessel disease type changes.  Global atrophy without hydrocephalus.  No intracranial hemorrhage.  No intracranial mass lesion detected on this unenhanced exam.  Occluded right internal carotid artery.  Please see below.  Mild transverse ligament hypertrophy.  Mild paranasal sinus mucosal thickening.  IMPRESSION: No acute infarct.  Small vessel disease type changes.  Global atrophy.  Mild paranasal sinus mucosal thickening.  Occluded right internal carotid artery.  Please see below.  MRA HEAD  Findings: The right internal carotid artery is occluded.  Collateral flow to the right carotid terminus.  Artifact versus focal stenosis proximal M1 segment middle cerebral artery bilaterally.  Mild narrowing and irregularity cavernous segment left internal carotid artery.  3.9 mm  outpouching medial aspect right internal carotid artery proximal cavernous segment suggestive of small aneurysm as versus result of atherosclerotic type changes.  Marked narrowing proximal A2 segment right anterior cerebral artery.  Middle cerebral artery moderate branch vessel irregularity bilaterally.  Right vertebral artery is dominant.  Left vertebral artery is small after the takeoff of the  left PICA.  Mild narrowing mid aspect of the basilar artery.  Nonvisualization left AICA.  Narrowed irregular right AICA.  Mild irregularity of the superior cerebellar artery bilaterally.  Moderate to marked tandem stenosis with poor delineation of a majority of the right posterior cerebral artery.  Left posterior cerebral artery and moderate to marked distal branch vessel irregularity.  Slight bulge of the basilar tip just beyond the takeoff of the right superior cerebral artery may be related to fetal type origin of the right posterior cerebral artery.  IMPRESSION: Occluded right internal carotid artery.  Poor delineation of a majority of the right posterior cerebral artery.  Portion visualized is markedly narrowed and irregular.  High-grade stenosis proximal A2 segment right anterior cerebral artery.  Branch vessel irregularity.  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   posterior cerebral artery.   Original Report Authenticated By: Lacy Duverney, M.D.    Scheduled Meds: . acetaminophen  500 mg Oral BID  . enoxaparin (LOVENOX) injection  40 mg Subcutaneous Q24H  . levothyroxine  125 mcg Oral QAC breakfast  . mometasone-formoterol  2 puff Inhalation BID  . pantoprazole  40 mg Oral Daily  . theophylline  300 mg Oral BID   Continuous Infusions:   Principal Problem:   Lightheaded Active Problems:   Carotid occlusion, right   Lightheadedness   Ataxia   Elevated transaminase level   Brendia Sacks, MD  [facility] Pager [phone] If 7PM-7AM, please contact night-coverage at [email or web address], password [facility] 01/16/2013, 11:19 AM  LOS: 3 days

## 2013-01-18 DIAGNOSIS — L570 Actinic keratosis: Secondary | ICD-10-CM | POA: Diagnosis not present

## 2013-01-18 DIAGNOSIS — D235 Other benign neoplasm of skin of trunk: Secondary | ICD-10-CM | POA: Diagnosis not present

## 2013-01-23 DIAGNOSIS — I6529 Occlusion and stenosis of unspecified carotid artery: Secondary | ICD-10-CM | POA: Diagnosis not present

## 2013-01-23 DIAGNOSIS — R42 Dizziness and giddiness: Secondary | ICD-10-CM | POA: Diagnosis not present

## 2013-01-23 DIAGNOSIS — R945 Abnormal results of liver function studies: Secondary | ICD-10-CM | POA: Diagnosis not present

## 2013-01-23 DIAGNOSIS — I498 Other specified cardiac arrhythmias: Secondary | ICD-10-CM | POA: Diagnosis not present

## 2013-01-23 DIAGNOSIS — E039 Hypothyroidism, unspecified: Secondary | ICD-10-CM | POA: Diagnosis not present

## 2013-01-29 NOTE — Progress Notes (Signed)
HPI: [name] is a 79 year old patient of Dr. Peter Swaziland we are following posthospitalization for lightheadedness and dizziness. The patient is followed by Dr. Swaziland for history of CAD with most recent cardiac catheterization completed in 2008 demonstrating an occluded second diagonal branch. He also has a history of carotid artery disease with a totally occluded right carotid artery per Doppler studies on 01/13/2013. He also has a history of a AAA. Her hospitalization the patient was noted to have a 4 second pause around 7:30 AM. Patient was seen on consultation was not found to be a candidate for pacemaker at that time. Consideration for outpatient cardiac monitor will be considered on this visit.   He is without complaints of chest pain, dizziness or near syncope. He states he is feeling well and staying hydrated.   Allergies  Allergen Reactions  . Aspirin Swelling  . Esomeprazole Magnesium Diarrhea  . Niacin Swelling  . Penicillins Swelling    Current Outpatient Prescriptions  Medication Sig Dispense Refill  . acetaminophen (TYLENOL) 500 MG tablet Take 500 mg by mouth 2 (two) times daily. For pain      . Fluticasone-Salmeterol (ADVAIR DISKUS) 250-50 MCG/DOSE AEPB Inhale 1 puff into the lungs every 12 (twelve) hours.        Marland Kitchen levothyroxine (SYNTHROID, LEVOTHROID) 125 MCG tablet Take 125 mcg by mouth daily.        . Multiple Vitamin (MULTIVITAMIN) tablet Take 1 tablet by mouth daily.        . pantoprazole (PROTONIX) 40 MG tablet Take 1 tablet (40 mg total) by mouth daily.  30 tablet  11  . Probiotic Product (ALIGN) 4 MG CAPS Take 4 mg by mouth daily.       . theophylline (THEOPHYLLINE) 300 MG 12 hr tablet Take 300 mg by mouth 2 (two) times daily.         No current facility-administered medications for this visit.    Past Medical History  Diagnosis Date  . Coronary artery disease     WITH HISTORY OF OCCLUSION OF A DIAGONAL BRANCH  . SBO (small bowel obstruction)   . Carotid  occlusion, right   . COPD (chronic obstructive pulmonary disease)   . Gastritis   . AAA (abdominal aortic aneurysm)   . Hiatal hernia   . Weight loss   . Dysphagia   . Cancer   . Hypothyroidism     Past Surgical History  Procedure Laterality Date  . Cardiac catheterization  06/08/2007    EF 55%  . Abdominal aortic aneurysm repair    . Cardiovascular stress test  05/03/2006    EF 60%  . Upper gastrointestinal endoscopy  07/13/06  . Upper gastrointestinal endoscopy  02/19/05  . Upper gastrointestinal endoscopy  07/22/2010    EGD ED  . Colonoscopy  07/22/2010  . Hernia repair    . Esophagogastroduodenoscopy  05/26/2011    Procedure: ESOPHAGOGASTRODUODENOSCOPY (EGD);  Surgeon: Malissa Hippo, MD;  Location: [facility];  Service: Endoscopy;  Laterality: N/A;  3:00  . Balloon dilation  05/26/2011    Procedure: BALLOON DILATION;  Surgeon: Malissa Hippo, MD;  Location: [facility];  Service: Endoscopy;  Laterality: N/A;  . Esophagogastroduodenoscopy  07/21/2012    Procedure: ESOPHAGOGASTRODUODENOSCOPY (EGD);  Surgeon: Malissa Hippo, MD;  Location: [facility];  Service: Endoscopy;  Laterality: N/A;  325-changed to 225 Ann to notify pt  . Cholecystectomy    . Low back surgery      [id number]  of systems complete and found to be negative unless listed above  PHYSICAL EXAM BP 122/68  Pulse 58  Ht 6' (1.829 m)  Wt 143 lb (64.864 kg)  BMI 19.39 kg/m2 General: Well developed, well nourished, in no acute distress Head: Eyes PERRLA, No xanthomas.   Normal cephalic and atramatic  Lungs: Clear bilaterally to auscultation and percussion. Heart: HRRR S1 S2, without MRG.  Pulses are 2+ & equal.            No carotid bruit. No JVD.  No abdominal bruits. No femoral bruits. Abdomen: Bowel sounds are positive, abdomen soft and non-tender without masses or                  Hernia's noted. Msk:  Back normal, normal gait. Normal strength and tone for age. Extremities: No clubbing,  cyanosis or edema.  DP +1 Neuro: Alert and oriented X 3. Psych:  Good affect, responds appropriately  EKG: Sinus bradycardia rate of 58 bpm. T-wave inversion in the inferior leads.  ASSESSMENT AND PLAN

## 2013-01-30 ENCOUNTER — Encounter: Payer: Self-pay | Admitting: Adult Health

## 2013-01-30 ENCOUNTER — Ambulatory Visit (INDEPENDENT_AMBULATORY_CARE_PROVIDER_SITE_OTHER): Payer: Medicare Other | Admitting: Adult Health

## 2013-01-30 VITALS — BP 122/68 | HR 58 | Ht 72.0 in | Wt 143.0 lb

## 2013-01-30 DIAGNOSIS — I498 Other specified cardiac arrhythmias: Secondary | ICD-10-CM | POA: Diagnosis not present

## 2013-01-30 DIAGNOSIS — I6521 Occlusion and stenosis of right carotid artery: Secondary | ICD-10-CM

## 2013-01-30 DIAGNOSIS — I6529 Occlusion and stenosis of unspecified carotid artery: Secondary | ICD-10-CM

## 2013-01-30 DIAGNOSIS — I2510 Atherosclerotic heart disease of native coronary artery without angina pectoris: Secondary | ICD-10-CM

## 2013-01-30 DIAGNOSIS — R001 Bradycardia, unspecified: Secondary | ICD-10-CM

## 2013-01-30 NOTE — Patient Instructions (Addendum)
Your physician recommends that you schedule a follow-up appointment in: 6 MONTHS 

## 2013-01-30 NOTE — Assessment & Plan Note (Signed)
Currently asymptomatic for chest pain or weakness. No planned cardiac testing at this time.  Will see him in 6 months unless symptomatic.

## 2013-01-30 NOTE — Assessment & Plan Note (Signed)
Remains asymptomatic for more dizziness or near syncope. Will not place cardiac monitor on the patient at this time. If symptoms return will reconsider doing this and need for pacemaker in this very young and active 79 y/o man.

## 2013-01-30 NOTE — Assessment & Plan Note (Signed)
Have discussed the carotid ultrasound with Dr.Wall on site here in clinic. Will not send to [facility] for evaluation or need for arteriogram. Will continue medical therapy with ASA.

## 2013-01-30 NOTE — Progress Notes (Deleted)
Name: Luis Guerra    DOB: 02/07/1921  Age: 79 y.o.  MR#: [id number]       PCP:  Catalina Pizza, MD      Insurance: Payor: MEDICARE / Plan: MEDICARE PART A AND B / Product Type: *No Product type* /   CC:    Chief Complaint  Patient presents with  . Bradycardia    VS Filed Vitals:   01/30/13 1355  BP: 122/68  Pulse: 58  Height: 6' (1.829 m)  Weight: 143 lb (64.864 kg)    Weights Current Weight  01/30/13 143 lb (64.864 kg)  01/13/13 138 lb 3.2 oz (62.687 kg)  10/16/12 142 lb (64.411 kg)    Blood Pressure  BP Readings from Last 3 Encounters:  01/30/13 122/68  01/16/13 120/53  10/16/12 130/62     Admit date:  (Not on file) Last encounter with [facility]:  01/15/2013   Allergy Aspirin; Esomeprazole magnesium; Niacin; and Penicillins  Current Outpatient Prescriptions  Medication Sig Dispense Refill  . acetaminophen (TYLENOL) 500 MG tablet Take 500 mg by mouth 2 (two) times daily. For pain      . Fluticasone-Salmeterol (ADVAIR DISKUS) 250-50 MCG/DOSE AEPB Inhale 1 puff into the lungs every 12 (twelve) hours.        Marland Kitchen levothyroxine (SYNTHROID, LEVOTHROID) 125 MCG tablet Take 125 mcg by mouth daily.        . Multiple Vitamin (MULTIVITAMIN) tablet Take 1 tablet by mouth daily.        . pantoprazole (PROTONIX) 40 MG tablet Take 1 tablet (40 mg total) by mouth daily.  30 tablet  11  . Probiotic Product (ALIGN) 4 MG CAPS Take 4 mg by mouth daily.       . theophylline (THEOPHYLLINE) 300 MG 12 hr tablet Take 300 mg by mouth 2 (two) times daily.         No current facility-administered medications for this visit.    Discontinued Meds:   There are no discontinued medications.  Patient Active Problem List   Diagnosis Date Noted  . Sinus bradycardia 01/16/2013  . Lightheaded 01/14/2013  . Lightheadedness 01/13/2013  . Ataxia 01/13/2013  . Elevated transaminase level 01/13/2013  . History of gastric cancer 09/17/2012  . Abdominal pain 07/17/2012  . Knee pain 12/07/2011  . OA  (osteoarthritis) of knee 12/07/2011  . Coronary artery disease   . Carotid occlusion, right   . INTERDIGITAL NEUROMA 04/10/2009  . JOINT EFFUSION, LEFT KNEE 04/10/2009  . DEGENERATIVE JOINT DISEASE, RIGHT KNEE 02/05/2009  . OSTEOARTHRITIS, LOWER LEG 01/30/2009  . DERANGEMENT OF POSTERIOR HORN OF MEDIAL MENISCUS 01/30/2009  . BUCKET HANDLE TEAR OF LATERAL MENISCUS 01/30/2009  . DERANGEMENT MENISCUS 01/30/2009  . Pain in joint, lower leg 01/30/2009    LABS    Component Value Date/Time   NA 141 01/14/2013 0624   NA 137 01/13/2013 0922   NA 143 07/10/2012 1517   K 3.6 01/14/2013 0624   K 3.5 01/13/2013 0922   K 4.2 07/10/2012 1517   CL 109 01/14/2013 0624   CL 102 01/13/2013 0922   CL 110 07/10/2012 1517   CO2 23 01/14/2013 0624   CO2 23 01/13/2013 0922   CO2 23 01/31/2009 1042   GLUCOSE 87 01/14/2013 0624   GLUCOSE 69* 01/13/2013 0922   GLUCOSE 84 07/10/2012 1517   BUN 19 01/14/2013 0624   BUN 21 01/13/2013 0922   BUN 27* 07/10/2012 1517   CREATININE 1.04 01/14/2013 0624   CREATININE 1.11 01/13/2013 4098  CREATININE 1.30 07/10/2012 1517   CALCIUM 8.3* 01/14/2013 0624   CALCIUM 9.0 01/13/2013 0922   CALCIUM 9.0 01/31/2009 1042   GFRNONAA 61* 01/14/2013 0624   GFRNONAA 56* 01/13/2013 0922   GFRNONAA 56* 01/31/2009 1042   GFRAA 71* 01/14/2013 0624   GFRAA 65* 01/13/2013 0922   GFRAA  Value: >60        The eGFR has been calculated using the MDRD equation. This calculation has not been validated in all clinical situations. eGFR's persistently <60 mL/min signify possible Chronic Kidney Disease. 01/31/2009 1042   CMP     Component Value Date/Time   NA 141 01/14/2013 0624   K 3.6 01/14/2013 0624   CL 109 01/14/2013 0624   CO2 23 01/14/2013 0624   GLUCOSE 87 01/14/2013 0624   BUN 19 01/14/2013 0624   CREATININE 1.04 01/14/2013 0624   CALCIUM 8.3* 01/14/2013 0624   PROT 6.4 01/14/2013 0624   ALBUMIN 3.1* 01/14/2013 0624   AST 45* 01/14/2013 0624   ALT 51 01/14/2013 0624   ALKPHOS 101 01/14/2013 0624    BILITOT 0.2* 01/14/2013 0624   GFRNONAA 61* 01/14/2013 0624   GFRAA 71* 01/14/2013 0624       Component Value Date/Time   WBC 8.4 01/13/2013 0922   WBC 14.3* 11/01/2007 0455   WBC 14.4* 10/31/2007 0425   HGB 14.5 01/13/2013 0922   HGB 15.3 07/10/2012 1517   HGB 13.6 01/31/2009 1042   HCT 44.7 01/13/2013 0922   HCT 45.0 07/10/2012 1517   HCT 39.0 01/31/2009 1042   MCV 93.3 01/13/2013 0922   MCV 82.7 11/01/2007 0455   MCV 83.3 10/31/2007 0425    Lipid Panel     Component Value Date/Time   CHOL 82 01/14/2013 0626   TRIG 47 01/14/2013 0626   HDL 34* 01/14/2013 0626   CHOLHDL 2.4 01/14/2013 0626   VLDL 9 01/14/2013 0626   LDLCALC 39 01/14/2013 0626    ABG    Component Value Date/Time   PHART 7.229* 09/24/2007 1108   PCO2ART 26.9* 09/24/2007 1108   PO2ART 114.0* 09/24/2007 1108   HCO3 10.8* 09/24/2007 1108   TCO2 22 07/10/2012 1517   ACIDBASEDEF 15.3* 09/24/2007 1108   O2SAT 97.2 09/24/2007 1108     Lab Results  Component Value Date   TSH 2.259 01/13/2013   BNP (last 3 results) No results found for this basename: PROBNP,  in the last 8760 hours Cardiac Panel (last 3 results) No results found for this basename: CKTOTAL, CKMB, TROPONINI, RELINDX,  in the last 72 hours  Iron/TIBC/Ferritin No results found for this basename: iron, tibc, ferritin     EKG Orders placed in visit on 01/30/13  . EKG 12-LEAD     Prior Assessment and Plan Problem List as of 01/30/2013   INTERDIGITAL NEUROMA   OSTEOARTHRITIS, LOWER LEG   DEGENERATIVE JOINT DISEASE, RIGHT KNEE   DERANGEMENT OF POSTERIOR HORN OF MEDIAL MENISCUS   BUCKET HANDLE TEAR OF LATERAL MENISCUS   DERANGEMENT MENISCUS   JOINT EFFUSION, LEFT KNEE   Pain in joint, lower leg   Coronary artery disease   Last Assessment & Plan   03/26/2011 Office Visit Written 03/26/2011 11:24 AM by Peter M Swaziland, MD     He remains asymptomatic. He is intolerant of aspirin due to gastrointestinal side effects. I made no changes in his therapy today and we'll  monitor him for any new symptoms. Will follow up again in one year.    Carotid occlusion, right  Knee pain   OA (osteoarthritis) of knee   Abdominal pain   History of gastric cancer   Lightheadedness   Ataxia   Elevated transaminase level   Lightheaded   Sinus bradycardia       Imaging: Dg Chest 2 View  01/13/2013   *RADIOLOGY REPORT*  Clinical Data: Dizziness and weak  CHEST - 2 VIEW  Comparison: This chest radiograph 10/24/2007  Findings: Cardiac leads project over the chest.  Heart size is borderline enlarged and likely accentuated by apical lordotic technique.  The thoracic aorta contour is tortuous and stable. Pulmonary vascularity is borderline prominent, especially in the lateral projection.  There is mild chronic peribronchial thickening.  No focal airspace disease, effusion, or pneumothorax. No acute osseous abnormality identified.  IMPRESSION: Borderline cardiomegaly and mild pulmonary vascular congestion.   Original Report Authenticated By: Britta Mccreedy, M.D.   Ct Head Wo Contrast  01/13/2013   *RADIOLOGY REPORT*  Clinical Data: Dizziness  CT HEAD WITHOUT CONTRAST  Technique:  Contiguous axial images were obtained from the base of the skull through the vertex without contrast.  Comparison: 09/19/2006  Findings: Moderate global atrophy.  Chronic ischemic changes in the periventricular white matter.  Lacunar infarcts in the left thalamus and right basal ganglia.  No mass effect or midline shift, or acute intracranial hemorrhage.  Minimal fluid in the dependent mastoid air cells.  Visualized paranasal sinuses are clear. Cranium is intact.  IMPRESSION: Minimal fluid in the mastoid air cells.  Chronic ischemic changes and atrophy are noted.   Original Report Authenticated By: Jolaine Click, M.D.   Mr [facility] Wo Contrast  01/15/2013   *RADIOLOGY REPORT*  Clinical Data:  Dizziness.  MRI BRAIN WITHOUT CONTRAST MRA HEAD WITHOUT CONTRAST  Technique: Multiplanar, multiecho pulse sequences of  the brain and surrounding structures were obtained according to standard protocol without intravenous contrast.  Angiographic images of the head were obtained using MRA technique without contrast.  Comparison: 01/13/2013 head CT.  MRI HEAD  Findings:  No acute infarct.  Small vessel disease type changes.  Global atrophy without hydrocephalus.  No intracranial hemorrhage.  No intracranial mass lesion detected on this unenhanced exam.  Occluded right internal carotid artery.  Please see below.  Mild transverse ligament hypertrophy.  Mild paranasal sinus mucosal thickening.  IMPRESSION: No acute infarct.  Small vessel disease type changes.  Global atrophy.  Mild paranasal sinus mucosal thickening.  Occluded right internal carotid artery.  Please see below.  MRA HEAD  Findings: The right internal carotid artery is occluded.  Collateral flow to the right carotid terminus.  Artifact versus focal stenosis proximal M1 segment middle cerebral artery bilaterally.  Mild narrowing and irregularity cavernous segment left internal carotid artery.  3.9 mm outpouching medial aspect right internal carotid artery proximal cavernous segment suggestive of small aneurysm as versus result of atherosclerotic type changes.  Marked narrowing proximal A2 segment right anterior cerebral artery.  Middle cerebral artery moderate branch vessel irregularity bilaterally.  Right vertebral artery is dominant.  Left vertebral artery is small after the takeoff of the left PICA.  Mild narrowing mid aspect of the basilar artery.  Nonvisualization left AICA.  Narrowed irregular right AICA.  Mild irregularity of the superior cerebellar artery bilaterally.  Moderate to marked tandem stenosis with poor delineation of a majority of the right posterior cerebral artery.  Left posterior cerebral artery and moderate to marked distal branch vessel irregularity.  Slight bulge of the basilar tip just beyond the takeoff of the right  superior cerebral artery may be  related to fetal type origin of the right posterior cerebral artery.  IMPRESSION: Occluded right internal carotid artery.  Poor delineation of a majority of the right posterior cerebral artery.  Portion visualized is markedly narrowed and irregular.  High-grade stenosis proximal A2 segment right anterior cerebral artery.  Branch vessel irregularity.  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   posterior cerebral artery.   Original Report Authenticated By: Lacy Duverney, M.D.   Mr Brain Wo Contrast  01/15/2013   *RADIOLOGY REPORT*  Clinical Data:  Dizziness.  MRI BRAIN WITHOUT CONTRAST MRA HEAD WITHOUT CONTRAST  Technique: Multiplanar, multiecho pulse sequences of the brain and surrounding structures were obtained according to standard protocol without intravenous contrast.  Angiographic images of the head were obtained using MRA technique without contrast.  Comparison: 01/13/2013 head CT.  MRI HEAD  Findings:  No acute infarct.  Small vessel disease type changes.  Global atrophy without hydrocephalus.  No intracranial hemorrhage.  No intracranial mass lesion detected on this unenhanced exam.  Occluded right internal carotid artery.  Please see below.  Mild transverse ligament hypertrophy.  Mild paranasal sinus mucosal thickening.  IMPRESSION: No acute infarct.  Small vessel disease type changes.  Global atrophy.  Mild paranasal sinus mucosal thickening.  Occluded right internal carotid artery.  Please see below.  MRA HEAD  Findings: The right internal carotid artery is occluded.  Collateral flow to the right carotid terminus.  Artifact versus focal stenosis proximal M1 segment middle cerebral artery bilaterally.  Mild narrowing and irregularity cavernous segment left internal carotid artery.  3.9 mm outpouching medial aspect right internal carotid artery proximal cavernous segment suggestive of small aneurysm as  versus result of atherosclerotic type changes.  Marked narrowing proximal A2 segment right anterior cerebral artery.  Middle cerebral artery moderate branch vessel irregularity bilaterally.  Right vertebral artery is dominant.  Left vertebral artery is small after the takeoff of the left PICA.  Mild narrowing mid aspect of the basilar artery.  Nonvisualization left AICA.  Narrowed irregular right AICA.  Mild irregularity of the superior cerebellar artery bilaterally.  Moderate to marked tandem stenosis with poor delineation of a majority of the right posterior cerebral artery.  Left posterior cerebral artery and moderate to marked distal branch vessel irregularity.  Slight bulge of the basilar tip just beyond the takeoff of the right superior cerebral artery may be related to fetal type origin of the right posterior cerebral artery.  IMPRESSION: Occluded right internal carotid artery.  Poor delineation of a majority of the right posterior cerebral artery.  Portion visualized is markedly narrowed and irregular.  High-grade stenosis proximal A2 segment right anterior cerebral artery.  Branch vessel irregularity.  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   posterior cerebral artery.   Original Report Authenticated By: Lacy Duverney, M.D.   US Carotid Duplex Bilateral  01/14/2013   *RADIOLOGY REPORT*  Clinical Data: Lightheadedness  BILATERAL CAROTID DUPLEX ULTRASOUND  Technique: Wallace Cullens scale imaging, color Doppler and duplex ultrasound was performed of bilateral carotid and vertebral arteries in the neck.  Comparison:  None.  Criteria:  Quantification of carotid stenosis is based on velocity parameters that correlate the residual internal carotid diameter with NASCET-based stenosis levels, using the diameter of the distal internal carotid lumen as the denominator for stenosis measurement.  The following velocity  measurements were obtained:                   PEAK SYSTOLIC/END DIASTOLIC RIGHT ICA:                        Occludedcm/sec CCA:                        86cm/sec SYSTOLIC ICA/CCA RATIO:     Not applicable DIASTOLIC ICA/CCA RATIO: ECA:                        85cm/sec  LEFT ICA:                        150cm/sec CCA:                        95cm/sec SYSTOLIC ICA/CCA RATIO:     1.57 DIASTOLIC ICA/CCA RATIO: ECA:                        75cm/sec  Findings:  RIGHT CAROTID ARTERY: Mild plaque in the right common and external carotid arteries.  The right internal carotid artery is occluded just above the bulb.  RIGHT VERTEBRAL ARTERY:  Antegrade.  LEFT CAROTID ARTERY: There is moderate calcified plaque in the bulb.  Low resistance internal carotid Doppler pattern with sharp upstroke.  Moderate tortuosity of this vessel.  LEFT VERTEBRAL ARTERY:  Antegrade with a low resistance Doppler wave form.  IMPRESSION: Right internal carotid artery occlusion.  50-69% stenosis in the left internal carotid artery.   Original Report Authenticated By: Jolaine Click, M.D.

## 2013-02-21 DIAGNOSIS — R42 Dizziness and giddiness: Secondary | ICD-10-CM | POA: Diagnosis not present

## 2013-02-21 DIAGNOSIS — K219 Gastro-esophageal reflux disease without esophagitis: Secondary | ICD-10-CM | POA: Diagnosis not present

## 2013-02-21 DIAGNOSIS — J449 Chronic obstructive pulmonary disease, unspecified: Secondary | ICD-10-CM | POA: Diagnosis not present

## 2013-02-22 DIAGNOSIS — I739 Peripheral vascular disease, unspecified: Secondary | ICD-10-CM | POA: Diagnosis not present

## 2013-03-12 ENCOUNTER — Ambulatory Visit (INDEPENDENT_AMBULATORY_CARE_PROVIDER_SITE_OTHER): Payer: Medicare Other | Admitting: Internal Medicine

## 2013-03-12 ENCOUNTER — Encounter (INDEPENDENT_AMBULATORY_CARE_PROVIDER_SITE_OTHER): Payer: Self-pay | Admitting: Internal Medicine

## 2013-03-12 VITALS — BP 120/70 | HR 68 | Temp 97.1°F | Resp 20 | Ht 72.0 in | Wt 141.1 lb

## 2013-03-12 DIAGNOSIS — Z85028 Personal history of other malignant neoplasm of stomach: Secondary | ICD-10-CM

## 2013-03-12 DIAGNOSIS — R634 Abnormal weight loss: Secondary | ICD-10-CM | POA: Diagnosis not present

## 2013-03-12 DIAGNOSIS — K3184 Gastroparesis: Secondary | ICD-10-CM | POA: Diagnosis not present

## 2013-03-12 MED ORDER — MEGESTROL ACETATE 400 MG/10ML PO SUSP: 200.0000 mg | Freq: Every day | ORAL | Status: DC

## 2013-03-12 NOTE — Progress Notes (Signed)
Presenting complaint;  Followup for nausea and vomiting. Patient complains of continued weight loss.  Subjective:  Patient is a 79 year old Caucasian male with complicated GI history is reviewed under past medical history who is here for scheduled visit. He was last seen on 09/12/2012. He is accompanied by his wife. He is very concerned about continued weight loss. He weighed around 195 pounds back in 2009. He has lost another 6 pounds since his last visit 6 months ago. He does not have a good appetite. He has sporadic nausea and vomiting. He believes she's vomited 3 or 4 times in the last 6 months. He denies heartburn dysphagia or abdominal pain. He also denies melena or rectal bleeding. His bowels move at least twice daily. He stays busy. He also does yard work. He states Zantac was discontinued by Dr. Dwana Melena.  Current Medications: Current Outpatient Prescriptions  Medication Sig Dispense Refill  . acetaminophen (TYLENOL) 500 MG tablet Take 500 mg by mouth 2 (two) times daily. For pain      . Fluticasone-Salmeterol (ADVAIR DISKUS) 250-50 MCG/DOSE AEPB Inhale 1 puff into the lungs every 12 (twelve) hours.        Marland Kitchen levothyroxine (SYNTHROID, LEVOTHROID) 125 MCG tablet Take 125 mcg by mouth daily.        . Multiple Vitamin (MULTIVITAMIN) tablet Take 1 tablet by mouth daily.        . pantoprazole (PROTONIX) 40 MG tablet Take 1 tablet (40 mg total) by mouth daily.  30 tablet  11  . Probiotic Product (ALIGN) 4 MG CAPS Take 4 mg by mouth daily.       . theophylline (THEOPHYLLINE) 300 MG 12 hr tablet Take 300 mg by mouth 2 (two) times daily.         No current facility-administered medications for this visit.   Past medical history; Remote history of peptic ulcer disease with partial gastrectomy. Chronic GERD with esophageal stricture dilated in November 2011 and again in September 2012. On EGD of September 2012 he was found to have small gastric adenocarcinoma which was resected endoscopically  in October 2012 at [facility] by Dr. Joneen Caraway. Last EGD was in November 2013 revealing markedly dilated stomach with thickened gastro-jejunostomy, small anastomotic ulcer. Antral scar also noted at site of previous dissection for adenocarcinoma. Over 500 mL of bile was suctioned from the stomach. COPD. Hypertension. Holy cystectomy. Lumbar spine surgery several years ago. Repair of AAA and ventral herniorrhaphy in October 2007. Laparoscopy with lysis of adhesions for small bowel obstruction in March 2009. Left knee arthroscopy in 2010. Last colonoscopy was in November 2011 revealing pancolonic diverticulosis and small adenoma was also removed.    Objective: Blood pressure 120/70, pulse 68, temperature 97.1 F (36.2 C), temperature source Oral, resp. rate 20, height 6' (1.829 m), weight 141 lb 1.6 oz (64.003 kg). Patient is alert and in no acute distress Conjunctiva is pink. Sclera is nonicteric Oropharyngeal mucosa is normal. No neck masses or thyromegaly noted. Cardiac exam with regular rhythm normal S1 and S2. Grade 2/6 systolic ejection murmur noted at LLSB and aortic area. Lungs are clear to auscultation. Abdomen is full in the upper half. Bowel sounds are normal. Irregularity to the abdominal wall secondary to mesh was placed for ventral hernia repair. Abdomen is soft and nontender without organomegaly or masses. No LE edema or clubbing noted.   Assessment:  #1. Sporadic nausea and vomiting secondary to gastroparesis. While he's never had gastric emptying study, he was noted to have very large  stomach on CT and prior EGDs and a patent gastro-jejunostomy. He is not a candidate for Metoclopromide. Luckily his symptoms are not intractable. #2. History of gastric adenocarcinoma. Status post endoscopic resection in October 2012. No evidence of recurrence on EGD and CT. #3. Weight loss. Weight loss is possibly related to gastroparesis.     Plan:  Continue pantoprazole at 40 mg by mouth  every morning. Megestrol 200 mg by mouth daily. Weight check in 8 weeks. Will request copy of blood work from Dr. Keane Police office. Office visit in 6 months.

## 2013-03-12 NOTE — Patient Instructions (Signed)
Notify if you experience any side effects with Megace or Megestrol. Weight check in 8 weeks.

## 2013-03-23 DIAGNOSIS — R42 Dizziness and giddiness: Secondary | ICD-10-CM | POA: Diagnosis not present

## 2013-04-02 DIAGNOSIS — E785 Hyperlipidemia, unspecified: Secondary | ICD-10-CM | POA: Diagnosis not present

## 2013-04-02 DIAGNOSIS — E039 Hypothyroidism, unspecified: Secondary | ICD-10-CM | POA: Diagnosis not present

## 2013-04-02 DIAGNOSIS — I10 Essential (primary) hypertension: Secondary | ICD-10-CM | POA: Diagnosis not present

## 2013-04-09 DIAGNOSIS — Z7901 Long term (current) use of anticoagulants: Secondary | ICD-10-CM | POA: Diagnosis not present

## 2013-04-09 DIAGNOSIS — J4489 Other specified chronic obstructive pulmonary disease: Secondary | ICD-10-CM | POA: Diagnosis not present

## 2013-04-09 DIAGNOSIS — E039 Hypothyroidism, unspecified: Secondary | ICD-10-CM | POA: Diagnosis not present

## 2013-04-09 DIAGNOSIS — R944 Abnormal results of kidney function studies: Secondary | ICD-10-CM | POA: Diagnosis not present

## 2013-04-09 DIAGNOSIS — K219 Gastro-esophageal reflux disease without esophagitis: Secondary | ICD-10-CM | POA: Diagnosis not present

## 2013-04-09 DIAGNOSIS — J449 Chronic obstructive pulmonary disease, unspecified: Secondary | ICD-10-CM | POA: Diagnosis not present

## 2013-05-01 DIAGNOSIS — H6120 Impacted cerumen, unspecified ear: Secondary | ICD-10-CM | POA: Diagnosis not present

## 2013-05-03 DIAGNOSIS — L609 Nail disorder, unspecified: Secondary | ICD-10-CM | POA: Diagnosis not present

## 2013-05-03 DIAGNOSIS — I70209 Unspecified atherosclerosis of native arteries of extremities, unspecified extremity: Secondary | ICD-10-CM | POA: Diagnosis not present

## 2013-05-03 DIAGNOSIS — L851 Acquired keratosis [keratoderma] palmaris et plantaris: Secondary | ICD-10-CM | POA: Diagnosis not present

## 2013-05-03 DIAGNOSIS — I739 Peripheral vascular disease, unspecified: Secondary | ICD-10-CM | POA: Diagnosis not present

## 2013-05-07 ENCOUNTER — Telehealth (INDEPENDENT_AMBULATORY_CARE_PROVIDER_SITE_OTHER): Payer: Self-pay | Admitting: *Deleted

## 2013-05-07 NOTE — Telephone Encounter (Signed)
Let us check his weight again in 2 months

## 2013-05-07 NOTE — Telephone Encounter (Signed)
Patient called and ask to come by in November for a weight check.

## 2013-05-07 NOTE — Telephone Encounter (Signed)
Luis Guerra presented to the office today for a weight check. His weight today was 138.2lbs . At his last office visit , 03/12/13 , he weighed 141.6lbs. Patient has an appointment January 2015 to see Dr.Rehman. Forwarded to Dr.Rehman as Lorain Childes.

## 2013-05-16 DIAGNOSIS — Z23 Encounter for immunization: Secondary | ICD-10-CM | POA: Diagnosis not present

## 2013-05-17 DIAGNOSIS — Z961 Presence of intraocular lens: Secondary | ICD-10-CM | POA: Diagnosis not present

## 2013-05-17 DIAGNOSIS — H04129 Dry eye syndrome of unspecified lacrimal gland: Secondary | ICD-10-CM | POA: Diagnosis not present

## 2013-06-05 ENCOUNTER — Encounter (INDEPENDENT_AMBULATORY_CARE_PROVIDER_SITE_OTHER): Payer: Self-pay

## 2013-06-27 DIAGNOSIS — T148XXA Other injury of unspecified body region, initial encounter: Secondary | ICD-10-CM | POA: Diagnosis not present

## 2013-06-27 DIAGNOSIS — S51809A Unspecified open wound of unspecified forearm, initial encounter: Secondary | ICD-10-CM | POA: Diagnosis not present

## 2013-07-12 DIAGNOSIS — I70209 Unspecified atherosclerosis of native arteries of extremities, unspecified extremity: Secondary | ICD-10-CM | POA: Diagnosis not present

## 2013-07-16 ENCOUNTER — Other Ambulatory Visit (HOSPITAL_COMMUNITY): Payer: Self-pay | Admitting: Internal Medicine

## 2013-07-17 ENCOUNTER — Encounter: Payer: Self-pay | Admitting: Orthopedic Surgery

## 2013-07-17 ENCOUNTER — Ambulatory Visit (INDEPENDENT_AMBULATORY_CARE_PROVIDER_SITE_OTHER): Payer: Medicare Other | Admitting: Orthopedic Surgery

## 2013-07-17 VITALS — BP 144/82 | Ht 72.0 in | Wt 141.0 lb

## 2013-07-17 DIAGNOSIS — M1710 Unilateral primary osteoarthritis, unspecified knee: Secondary | ICD-10-CM

## 2013-07-17 NOTE — Progress Notes (Signed)
Patient ID: BRYSE BLANCHETTE, male   DOB: 23-Aug-1921, 79 y.o.   MRN: [id number] Chief Complaint  Patient presents with  . Follow-up    Recheck bilateral knees.    Request bilat knee inj   Knee  Injection Procedure Note  Pre-operative Diagnosis: left knee oa  Post-operative Diagnosis: same  Indications: pain  Anesthesia: ethyl chloride   Procedure Details   Verbal consent was obtained for the procedure. Time out was completed.The joint was prepped with alcohol, followed by  Ethyl chloride spray and A 20 gauge needle was inserted into the knee via lateral approach; 4ml 1% lidocaine and 1 ml of depomedrol  was then injected into the joint . The needle was removed and the area cleansed and dressed.  Complications:  None; patient tolerated the procedure well. Knee  Injection Procedure Note  Pre-operative Diagnosis: right knee oa  Post-operative Diagnosis: same  Indications: pain  Anesthesia: ethyl chloride   Procedure Details   Verbal consent was obtained for the procedure. Time out was completed.The joint was prepped with alcohol, followed by  Ethyl chloride spray and A 20 gauge needle was inserted into the knee via lateral approach; 4ml 1% lidocaine and 1 ml of depomedrol  was then injected into the joint . The needle was removed and the area cleansed and dressed.  Complications:  None; patient tolerated the procedure well.

## 2013-07-17 NOTE — Patient Instructions (Addendum)

## 2013-07-25 ENCOUNTER — Encounter: Payer: Self-pay | Admitting: Cardiology

## 2013-07-25 ENCOUNTER — Ambulatory Visit (INDEPENDENT_AMBULATORY_CARE_PROVIDER_SITE_OTHER): Payer: Medicare Other | Admitting: Cardiology

## 2013-07-25 ENCOUNTER — Telehealth (INDEPENDENT_AMBULATORY_CARE_PROVIDER_SITE_OTHER): Payer: Self-pay | Admitting: *Deleted

## 2013-07-25 VITALS — BP 162/62 | HR 56 | Ht 72.0 in | Wt 139.0 lb

## 2013-07-25 DIAGNOSIS — G459 Transient cerebral ischemic attack, unspecified: Secondary | ICD-10-CM

## 2013-07-25 DIAGNOSIS — I10 Essential (primary) hypertension: Secondary | ICD-10-CM | POA: Diagnosis not present

## 2013-07-25 DIAGNOSIS — I6529 Occlusion and stenosis of unspecified carotid artery: Secondary | ICD-10-CM

## 2013-07-25 DIAGNOSIS — I6521 Occlusion and stenosis of right carotid artery: Secondary | ICD-10-CM

## 2013-07-25 DIAGNOSIS — I2510 Atherosclerotic heart disease of native coronary artery without angina pectoris: Secondary | ICD-10-CM | POA: Diagnosis not present

## 2013-07-25 MED ORDER — CLOPIDOGREL BISULFATE 75 MG PO TABS: 75.0000 mg | ORAL_TABLET | Freq: Every day | ORAL | Status: DC

## 2013-07-25 NOTE — Telephone Encounter (Signed)
He is holding his weight; therefore will not make any changes until office visit.

## 2013-07-25 NOTE — Patient Instructions (Addendum)
Your physician recommends that you schedule a follow-up appointment in: 3 months  Your physician has recommended you make the following change in your medication:   1) START PLAVIX 75MG  ONCE DAILY  Your physician recommends THAT YOU HAVE A REFERRAL PLACED FOR NEUROLOGY A staff member from our office will alert you the with appointment date and time, once available

## 2013-07-25 NOTE — Progress Notes (Signed)
Clinical Summary Luis Guerra is a 79 y.o.male last seen by NP Lyman Bishop, this is our first visit together. He was seen for the following medical problems.  1. CAD - last cath 2008 with occluded D2, has been medically managed - he is not on ASA because of allergy - no chest pain, no SOB or DOE  2. Carotid stenosis - occluded RICA by Doppler 12/2012 - medically managed  4. Bradycardia - per notes noted to have a 4 second pause during a prior hospitalization 12/2012, was not felt to be a good candidate for a pacemaker at that time.  - TSH 12/2012 2.3 - no episodes of lightheadedness or dizziness recently  5. Slurred speech - November 4th episode of slurred speech that lasted approx 30 minutes. No facial droop, no other focal neuro changes. No repeat episodes since.  - patient refused to go to the hospital - no repeat symptoms since  6. HTN - losartan stopped 12/2012 during admission for dizziness, currently not on blood pressure medicaitons  Past Medical History  Diagnosis Date  . Coronary artery disease     WITH HISTORY OF OCCLUSION OF A DIAGONAL Luis Guerra  . SBO (small bowel obstruction)   . Carotid occlusion, right   . COPD (chronic obstructive pulmonary disease)   . Gastritis   . AAA (abdominal aortic aneurysm)   . Hiatal hernia   . Weight loss   . Dysphagia   . Cancer   . Hypothyroidism      Allergies  Allergen Reactions  . Aspirin Swelling  . Esomeprazole Magnesium Diarrhea  . Niacin Swelling  . Penicillins Swelling     Current Outpatient Prescriptions  Medication Sig Dispense Refill  . acetaminophen (TYLENOL) 500 MG tablet Take 500 mg by mouth 2 (two) times daily. For pain      . Fluticasone-Salmeterol (ADVAIR DISKUS) 250-50 MCG/DOSE AEPB Inhale 1 puff into the lungs every 12 (twelve) hours.        Marland Kitchen levothyroxine (SYNTHROID, LEVOTHROID) 125 MCG tablet Take 125 mcg by mouth daily.        . megestrol (MEGACE) 400 MG/10ML suspension Take 5 mLs (200 mg total)  by mouth daily.  240 mL  1  . Multiple Vitamin (MULTIVITAMIN) tablet Take 1 tablet by mouth daily.        . pantoprazole (PROTONIX) 40 MG tablet TAKE 1 TABLET BY MOUTH EVERY DAY  30 tablet  5  . Probiotic Product (ALIGN) 4 MG CAPS Take 4 mg by mouth daily.       . theophylline (THEOPHYLLINE) 300 MG 12 hr tablet Take 300 mg by mouth 2 (two) times daily.         No current facility-administered medications for this visit.     Past Surgical History  Procedure Laterality Date  . Cardiac catheterization  06/08/2007    EF 55%  . Abdominal aortic aneurysm repair    . Cardiovascular stress test  05/03/2006    EF 60%  . Upper gastrointestinal endoscopy  07/13/06  . Upper gastrointestinal endoscopy  02/19/05  . Upper gastrointestinal endoscopy  07/22/2010    EGD ED  . Colonoscopy  07/22/2010  . Hernia repair    . Esophagogastroduodenoscopy  05/26/2011    Procedure: ESOPHAGOGASTRODUODENOSCOPY (EGD);  Surgeon: Malissa Hippo, MD;  [facility];  Service: Endoscopy;  Laterality: N/A;  3:00  . Balloon dilation  05/26/2011    Procedure: BALLOON DILATION;  Surgeon: Malissa Hippo, MD;  Location: [facility];  Service: Endoscopy;  Laterality: N/A;  . Esophagogastroduodenoscopy  07/21/2012    Procedure: ESOPHAGOGASTRODUODENOSCOPY (EGD);  Surgeon: Malissa Hippo, MD;  Location: [facility];  Service: Endoscopy;  Laterality: N/A;  325-changed to 225 Ann to notify pt  . Cholecystectomy    . Low back surgery       Allergies  Allergen Reactions  . Aspirin Swelling  . Esomeprazole Magnesium Diarrhea  . Niacin Swelling  . Penicillins Swelling      Family History  Problem Relation Age of Onset  . Heart disease Father   . Healthy Sister   . Heart disease Sister   . Stroke Sister   . Stroke Daughter   . Diabetes Daughter   . Healthy Son      Social History Luis Guerra reports that he quit smoking about 55 years ago. He has never used smokeless tobacco. Luis Guerra reports  that he does not drink alcohol.   Review of Systems CONSTITUTIONAL: No weight loss, fever, chills, weakness or fatigue.  HEENT: Eyes: No visual loss, blurred vision, double vision or yellow sclerae.No hearing loss, sneezing, congestion, runny nose or sore throat.  SKIN: No rash or itching.  CARDIOVASCULAR: per HPI RESPIRATORY: No shortness of breath, cough or sputum.  GASTROINTESTINAL: No anorexia, nausea, vomiting or diarrhea. No abdominal pain or blood.  GENITOURINARY: No burning on urination, no polyuria NEUROLOGICAL: transient episode of slurred speech. MUSCULOSKELETAL: No muscle, back pain, joint pain or stiffness.  LYMPHATICS: No enlarged nodes. No history of splenectomy.  PSYCHIATRIC: No history of depression or anxiety.  ENDOCRINOLOGIC: No reports of sweating, cold or heat intolerance. No polyuria or polydipsia.  Marland Kitchen   Physical Examination p 56 bp 150/70 Wt 139 lbs BMI 19 Gen: resting comfortably, no acute distress HEENT: no scleral icterus, pupils equal round and reactive, no palptable cervical adenopathy,  CV: RRR, no m/r/g, no JVD, no carotid bruits Resp: Clear to auscultation bilaterally GI: abdomen is soft, non-tender, non-distended, normal bowel sounds, no hepatosplenomegaly MSK: extremities are warm, no edema.  Skin: warm, no rash Neuro:  no focal deficits Psych: appropriate affect   Assessment and Plan  1. CAD - no current symptoms - continue secondary prevention and risk factor modification  2. Carotid stenosis - known occluded right ICA from prior studies, unclear history of recent episode of slurred speech that lasted approx 30 minutes - will start plavix in setting of ASA for possible TIA and secondary prevention - I'm not convinced his occluded right ICA would have any relation to this episode. Given his comorbidities any form of intervention would be at increased risk, an I am not sure its warranted in this situation. I will refer him to neuro to get there  recommendations on this as well.   3. Bradycardia - no current symptoms, continue to follow clinically  4. HTN - off blood pressure medicines since recent admission for dizziness. Dizziness improved. Blood pressures somewhat elevated today, continue to follow clinically, will not start any agents today.       Antoine Poche, M.D., F.A.C.C.

## 2013-07-25 NOTE — Telephone Encounter (Signed)
Weight check = 138.2 lbs

## 2013-07-25 NOTE — Telephone Encounter (Signed)
Patient presented to the office today for his weight check - 138.2 lbs. He was last here on 05/07/13 for a weight check and at that time he weighed 138.2 lbs also. Prior to this in July,03/12/13,he weighed 141.6 lbs. Patient has an appointment with Dr.Rehman in January. Forwarded to Dr.Rehman to review.

## 2013-08-16 DIAGNOSIS — C44319 Basal cell carcinoma of skin of other parts of face: Secondary | ICD-10-CM | POA: Diagnosis not present

## 2013-08-16 DIAGNOSIS — L570 Actinic keratosis: Secondary | ICD-10-CM | POA: Diagnosis not present

## 2013-08-22 ENCOUNTER — Ambulatory Visit (INDEPENDENT_AMBULATORY_CARE_PROVIDER_SITE_OTHER): Payer: Medicare Other | Admitting: Neurology

## 2013-08-22 ENCOUNTER — Encounter: Payer: Self-pay | Admitting: Neurology

## 2013-08-22 VITALS — BP 126/66 | HR 62 | Temp 97.5°F | Ht 72.0 in | Wt 137.0 lb

## 2013-08-22 DIAGNOSIS — G459 Transient cerebral ischemic attack, unspecified: Secondary | ICD-10-CM | POA: Diagnosis not present

## 2013-08-22 NOTE — Patient Instructions (Signed)
1.  I would continue the Plavix. 2.  I don't think we need to check the arteries in the neck again, because I think the risk of such a surgery on the carotid artery would outweigh the benefits. 3.  The only thing I would check, would be your heart.  You had your heart checked in May, so I will ask Dr. Wyline Mood if it is reasonable to check another echocardiogram or Holter monitor this soon. 4.  Otherwise, your cholesterol and blood sugars look good. 5.  Continue doing what you are doing!  Merry Christmas!  I will see you in 3 months.

## 2013-08-22 NOTE — Progress Notes (Signed)
NEUROLOGY CONSULTATION NOTE  Luis Guerra MRN: [id number] DOB: March 18, 1921  Referring provider: Dr. Wyline Mood (cardiology) Primary care provider: Dr. Margo Aye  Reason for consult:  Transient speech disturbance.  HISTORY OF PRESENT ILLNESS: Luis Guerra is a 79 year old right-handed man with history of coronary artery disease with occluded D2, occluded right ICA, bradycardia, COPD, AAA, and hypertension who presents for evaluation of transient slurred speech.  He is accompanied by his wife.  Records and images were personally reviewed where available.    On 07/03/13, he had an episode of transient speech disturbance, lasting about 30 minutes.  Suddenly, he had difficulty pronouncing words and it came out as mumbling.  He had no word-finding difficulty.  There were no focal symptoms, such as facial droop, unilateral weakness or numbness, or gait instability.  He did not have headache.  He has mild dizziness once in a while.  He was not on any antiplatelet agent at the time, as he is allergic to aspirin.  His cardiologist, Dr. Wyline Mood, started him on Plavix.  Since that time, he has not had any recurrent episodes and has been feeling well.  He staggers a little when he walks but does not feel unstable or had any falls.  He has significant history of cardiovascular disease.  He has an occluded D2, right ICA occlusion, and episode of 4 second pause in May 2014.  He has hypertension and hypothyroidism as well.   MRI of brain without contrast performed 01/15/13:  small vessel disease, global atrophy. MRA of head 01/15/13:  occluded right ICA, high-grade stenosis proximal A2 segment of right ACA. Labs May 2014:  LDL 39, Hgb A1c 5.4, TSH 2.259. 2D Echo 01/15/13:  EF 55-60% with moderate LVH. Carotid doppler 01/14/13 right ICA occlusion, 50-69% stenosis left ICA.  PAST MEDICAL HISTORY: Past Medical History  Diagnosis Date  . Coronary artery disease     WITH HISTORY OF OCCLUSION OF A DIAGONAL BRANCH    . SBO (small bowel obstruction)   . Carotid occlusion, right   . COPD (chronic obstructive pulmonary disease)   . Gastritis   . AAA (abdominal aortic aneurysm)   . Hiatal hernia   . Weight loss   . Dysphagia   . Cancer     stomach cancer  . Hypothyroidism     PAST SURGICAL HISTORY: Past Surgical History  Procedure Laterality Date  . Cardiac catheterization  06/08/2007    EF 55%  . Abdominal aortic aneurysm repair    . Cardiovascular stress test  05/03/2006    EF 60%  . Upper gastrointestinal endoscopy  07/13/06  . Upper gastrointestinal endoscopy  02/19/05  . Upper gastrointestinal endoscopy  07/22/2010    EGD ED  . Colonoscopy  07/22/2010  . Hernia repair    . Esophagogastroduodenoscopy  05/26/2011    Procedure: ESOPHAGOGASTRODUODENOSCOPY (EGD);  Surgeon: Malissa Hippo, MD;  [facility]: [facility];  Service: Endoscopy;  Laterality: N/A;  3:00  . Balloon dilation  05/26/2011    Procedure: BALLOON DILATION;  Surgeon: Malissa Hippo, MD;  [facility]: [facility];  Service: Endoscopy;  Laterality: N/A;  . Esophagogastroduodenoscopy  07/21/2012    Procedure: ESOPHAGOGASTRODUODENOSCOPY (EGD);  Surgeon: Malissa Hippo, MD;  [facility]: [facility];  Service: Endoscopy;  Laterality: N/A;  325-changed to 225 Ann to notify pt  . Cholecystectomy    . Low back surgery      MEDICATIONS: Current Outpatient Prescriptions on File Prior to Visit  Medication Sig  Dispense Refill  . acetaminophen (TYLENOL) 500 MG tablet Take 500 mg by mouth 2 (two) times daily. For pain      . clopidogrel (PLAVIX) 75 MG tablet Take 1 tablet (75 mg total) by mouth daily.  90 tablet  3  . Fluticasone-Salmeterol (ADVAIR DISKUS) 250-50 MCG/DOSE AEPB Inhale 1 puff into the lungs every 12 (twelve) hours.        Marland Kitchen levothyroxine (SYNTHROID, LEVOTHROID) 125 MCG tablet Take 125 mcg by mouth daily.        . Multiple Vitamin (MULTIVITAMIN) tablet Take 1 tablet by mouth daily.        . pantoprazole (PROTONIX) 40 MG  tablet TAKE 1 TABLET BY MOUTH EVERY DAY  30 tablet  5  . Probiotic Product (ALIGN) 4 MG CAPS Take 4 mg by mouth daily.       . theophylline (THEOPHYLLINE) 300 MG 12 hr tablet Take 300 mg by mouth 2 (two) times daily.         No current facility-administered medications on file prior to visit.    ALLERGIES: Allergies  Allergen Reactions  . Aspirin Swelling  . Esomeprazole Magnesium Diarrhea  . Niacin Swelling  . Penicillins Swelling    FAMILY HISTORY: Family History  Problem Relation Age of Onset  . Heart disease Father   . Healthy Sister   . Heart disease Sister   . Stroke Sister   . Stroke Daughter   . Diabetes Daughter   . Healthy Son     SOCIAL HISTORY: History   Social History  . Marital Status: Married    Spouse Name: N/A    Number of Children: 5  . Years of Education: N/A   Occupational History  . retired     [place] tobacco   Social History Main Topics  . Smoking status: Former Smoker    Quit date: 08/30/1957  . Smokeless tobacco: Never Used  . Alcohol Use: No  . Drug Use: No  . Sexual Activity: Not on file   Other Topics Concern  . Not on file   Social History Narrative  . No narrative on file    REVIEW OF SYSTEMS: Constitutional: No fevers, chills, or sweats, no generalized fatigue, change in appetite Eyes: No visual changes, double vision, eye pain Ear, nose and throat: No hearing loss, ear pain, nasal congestion, sore throat Cardiovascular: No chest pain, palpitations Respiratory:  No shortness of breath at rest or with exertion, wheezes GastrointestinaI: No nausea, vomiting, diarrhea, abdominal pain, fecal incontinence Genitourinary:  No dysuria, urinary retention or frequency Musculoskeletal:  No neck pain, back pain Integumentary: No rash, pruritus, skin lesions Neurological: as above Psychiatric: No depression, insomnia, anxiety Endocrine: No palpitations, fatigue, diaphoresis, mood swings, change in appetite, change in weight,  increased thirst Hematologic/Lymphatic:  No anemia, purpura, petechiae. Allergic/Immunologic: no itchy/runny eyes, nasal congestion, recent allergic reactions, rashes  PHYSICAL EXAM: Filed Vitals:   08/22/13 0742  BP: 126/66  Pulse: 62  Temp: 97.5 F (36.4 C)   General: No acute distress Head:  Normocephalic/atraumatic Neck: supple, no paraspinal tenderness, full range of motion Back: No paraspinal tenderness Heart: regular rate and rhythm Lungs: Clear to auscultation bilaterally. Vascular: No carotid bruits. Neurological Exam: Mental status: alert and oriented to person, place, and time, speech fluent and not dysarthric, language intact. Cranial nerves: CN I: not tested CN II: pupils equal, round and reactive to light, visual fields intact, fundi unremarkable. CN III, IV, VI:  full range of motion, no nystagmus, no ptosis  CN V: facial sensation intact CN VII: upper and lower face symmetric CN VIII: hearing intact CN IX, X: gag intact, uvula midline CN XI: sternocleidomastoid and trapezius muscles intact CN XII: tongue midline Bulk & Tone: normal, no fasciculations. Motor: 5/5 throughout Sensation: mildly reduced vibration sensation in toes.  Pinprick intact. Deep Tendon Reflexes: 2+ throughout, except absent in ankles, toes down Finger to nose testing: no dysmetria Heel to shin: no dysmetria Gait: mildly wide-based, with stagger.  No ataxia.  Able to turn safely.  Difficulty with tandem. Romberg negative.  IMPRESSION: Transient ischemic attack.  The only thing I would check, would be cardiac evaluation.  He had an echo done in May and was likely on telemetry at that time, so repeat testing may not be necessary.  PLAN: 1.  Continue Plavix 75mg  daily 2.  I would not check another carotid doppler, as I think the risks of surgery would outweigh the benefits anyway.  So it would not change management. 3.  LDL and Hgb A1c is at appropriate levels so no further treatment  necessary regarding these. 4.  The only thing that would change management would be a cardio-embolic source, as that would suggest treatment with anticoagulation.  I would check a TTE and Holter monitor, however these tests were recently done in May.  I defer to Dr. Wyline Mood regarding if repeat testing is necessary. 5.  Follow up in 3 months.  45 minutes spent with patient, over 50% spent counseling and coordinating care.   Thank you for allowing me to take part in the care of this patient.  Shon Millet, DO  CC:  Dina Rich, MD  Catalina Pizza, MD

## 2013-09-11 ENCOUNTER — Ambulatory Visit (INDEPENDENT_AMBULATORY_CARE_PROVIDER_SITE_OTHER): Payer: Medicare Other | Admitting: Internal Medicine

## 2013-09-11 ENCOUNTER — Encounter (INDEPENDENT_AMBULATORY_CARE_PROVIDER_SITE_OTHER): Payer: Self-pay | Admitting: Internal Medicine

## 2013-09-11 VITALS — BP 132/72 | HR 64 | Temp 97.6°F | Resp 16 | Ht 72.0 in | Wt 142.8 lb

## 2013-09-11 DIAGNOSIS — R634 Abnormal weight loss: Secondary | ICD-10-CM | POA: Diagnosis not present

## 2013-09-11 DIAGNOSIS — Z8719 Personal history of other diseases of the digestive system: Secondary | ICD-10-CM | POA: Diagnosis not present

## 2013-09-11 DIAGNOSIS — Z85028 Personal history of other malignant neoplasm of stomach: Secondary | ICD-10-CM

## 2013-09-11 NOTE — Patient Instructions (Signed)
Notify if you have abdominal pain or tarry stools.

## 2013-09-11 NOTE — Progress Notes (Signed)
Presenting complaint;  Followup for abdominal pain and weight loss. History of gastric carcinoma.  Brief database;  Patient is 80 year old Caucasian male who has history of small gastric carcinoma for which she underwent EGD with SMR at [facility] in October 2012.  He presented with abdominal pain in November 2013 and underwent abdominopelvic CT revealing massive distention of stomach and duodenum. He has history of gastrojejunostomy. He underwent EGD also November 2013 revealing large dilated stomach and duodenum but patent gastrojejunostomy. 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  On his last visit 6 months ago he weighed 141 pounds.  Subjective;  Marland Kitchen Patient is here for scheduled visit accompanied by his wife. He has no complaints today. He has gained close to 2 pounds. He has good appetite and eats 3 meals a day. He denies nausea vomiting heartburn or dysphagia. He also denies abdominal pain. He has 2-3 formed stools daily. He takes pantoprazole in a.m. and clopidogrel in p.m.    Current Medications: Current Outpatient Prescriptions  Medication Sig Dispense Refill  . acetaminophen (TYLENOL) 500 MG tablet Take 500 mg by mouth 2 (two) times daily. For pain      . clopidogrel (PLAVIX) 75 MG tablet Take 1 tablet (75 mg total) by mouth daily.  90 tablet  3  . Fluticasone-Salmeterol (ADVAIR DISKUS) 250-50 MCG/DOSE AEPB Inhale 1 puff into the lungs every 12 (twelve) hours.        Marland Kitchen levothyroxine (SYNTHROID, LEVOTHROID) 125 MCG tablet Take 125 mcg by mouth daily.        . Multiple Vitamin (MULTIVITAMIN) tablet Take 1 tablet by mouth daily.        . pantoprazole (PROTONIX) 40 MG tablet TAKE 1 TABLET BY MOUTH EVERY DAY  30 tablet  5  . Probiotic Product (ALIGN) 4 MG CAPS Take 4 mg by mouth daily.       . theophylline (THEOPHYLLINE) 300 MG 12 hr tablet Take 300 mg by mouth 2 (two) times daily.          No current facility-administered medications for this visit.     Objective: Blood pressure 132/72, pulse 64, temperature 97.6 F (36.4 C), temperature source Oral, resp. rate 16, height 6' (1.829 m), weight 142 lb 12.8 oz (64.774 kg). Patient is alert and in no acute distress. Conjunctiva is pink. Sclera is nonicteric Oropharyngeal mucosa is normal. No neck masses or thyromegaly noted. Cardiac exam with regular rhythm normal S1 and S2. Grade 7-9/8 systolic ejection murmur best heard at LLSB. Lungs are clear to auscultation. Abdomen is symmetrical. On palpation abdomen is soft and nontender. Mesh is palpable across upper abdomen. No organomegaly or masses noted.  No LE edema or clubbing noted.   Assessment:  #1. Weight loss. Weight loss seems to have leveled off. He has gained close to 2 pounds since his last visit. #2. History of esophageal stricture felt to be secondary to GERD. He has not experienced dysphagia since esophageal dilation of September 2012. #3. History of small gastric adenocarcinoma which was picked up incidentally and resected endoscopically at [facility] and he remains in remission. Last EGD was in November 2013. #4. Chronic dilation of stomach and duodenum with prior gastrojejunostomy. Suspect underlying motility disorder. He is presently asymptomatic.   Plan:  Patient advised to take pantoprazole in a.m. and clopidogrel in p.m to minimize interaction. Patient advised to call if he has tarry stool or abdominal pain.  office  visit in 6 months.

## 2013-09-13 DIAGNOSIS — Z85828 Personal history of other malignant neoplasm of skin: Secondary | ICD-10-CM | POA: Diagnosis not present

## 2013-09-13 DIAGNOSIS — L570 Actinic keratosis: Secondary | ICD-10-CM | POA: Diagnosis not present

## 2013-09-20 DIAGNOSIS — E1149 Type 2 diabetes mellitus with other diabetic neurological complication: Secondary | ICD-10-CM | POA: Diagnosis not present

## 2013-09-20 DIAGNOSIS — E119 Type 2 diabetes mellitus without complications: Secondary | ICD-10-CM | POA: Diagnosis not present

## 2013-10-29 ENCOUNTER — Ambulatory Visit (INDEPENDENT_AMBULATORY_CARE_PROVIDER_SITE_OTHER): Payer: Medicare Other | Admitting: Cardiology

## 2013-10-29 ENCOUNTER — Encounter: Payer: Self-pay | Admitting: Cardiology

## 2013-10-29 VITALS — BP 123/53 | HR 65 | Ht 72.0 in | Wt 138.0 lb

## 2013-10-29 DIAGNOSIS — I10 Essential (primary) hypertension: Secondary | ICD-10-CM | POA: Diagnosis not present

## 2013-10-29 DIAGNOSIS — I2510 Atherosclerotic heart disease of native coronary artery without angina pectoris: Secondary | ICD-10-CM | POA: Diagnosis not present

## 2013-10-29 DIAGNOSIS — I498 Other specified cardiac arrhythmias: Secondary | ICD-10-CM | POA: Diagnosis not present

## 2013-10-29 DIAGNOSIS — R001 Bradycardia, unspecified: Secondary | ICD-10-CM

## 2013-10-29 DIAGNOSIS — I6529 Occlusion and stenosis of unspecified carotid artery: Secondary | ICD-10-CM | POA: Diagnosis not present

## 2013-10-29 NOTE — Progress Notes (Signed)
Clinical Summa Luis Guerra is a 80 y.o.male last seen by NP Luis Guerra, this is our first visit together. He was seen for the following medical problems.   1. CAD  - last cath 2008 with occluded D2, has been medically managed  - he is not on ASA because of allergy, on plavix instead. - no chest pain, no SOB or DOE   2. Carotid stenosis  - occluded RICA by Doppler 12/2012  - medically managed   3. Bradycardia  - per notes noted to have a 4 second pause during a prior hospitalization 12/2012, was not felt to be a good candidate for a pacemaker at that time.  - TSH 12/2012 2.3  - no episodes of lightheadedness or dizziness recently   4. Slurred speech  - November 4th episode of slurred speech that lasted approx 30 minutes. No facial droop, no other focal neuro changes. No repeat episodes since.  - patient refused to go to the hospital  - no repeat symptoms since  - referred to neuro last visit, agree with starting plavix. Do not feel carotid disease is of significance.  - no recurrent symptoms  5. HTN - compliant with meds Past Medical History  Diagnosis Date  . Coronary artery disease     WITH HISTORY OF OCCLUSION OF A DIAGONAL BRANCH  . SBO (small bowel obstruction)   . Carotid occlusion, right   . COPD (chronic obstructive pulmonary disease)   . Gastritis   . AAA (abdominal aortic aneurysm)   . Hiatal hernia   . Weight loss   . Dysphagia   . Cancer     stomach cancer  . Hypothyroidism      Allergies  Allergen Reactions  . Aspirin Swelling  . Esomeprazole Magnesium Diarrhea  . Niacin Swelling  . Penicillins Swelling     Current Outpatient Prescriptions  Medication Sig Dispense Refill  . acetaminophen (TYLENOL) 500 MG tablet Take 500 mg by mouth 2 (two) times daily. For pain      . clopidogrel (PLAVIX) 75 MG tablet Take 1 tablet (75 mg total) by mouth daily.  90 tablet  3  . Fluticasone-Salmeterol (ADVAIR DISKUS) 250-50 MCG/DOSE AEPB Inhale 1 puff into the  lungs every 12 (twelve) hours.        Marland Kitchen levothyroxine (SYNTHROID, LEVOTHROID) 125 MCG tablet Take 125 mcg by mouth daily.        . Multiple Vitamin (MULTIVITAMIN) tablet Take 1 tablet by mouth daily.        . pantoprazole (PROTONIX) 40 MG tablet TAKE 1 TABLET BY MOUTH EVERY DAY  30 tablet  5  . Probiotic Product (ALIGN) 4 MG CAPS Take 4 mg by mouth daily.       . theophylline (THEOPHYLLINE) 300 MG 12 hr tablet Take 300 mg by mouth 2 (two) times daily.         No current facility-administered medications for this visit.     Past Surgical History  Procedure Laterality Date  . Cardiac catheterization  06/08/2007    EF 55%  . Abdominal aortic aneurysm repair    . Cardiovascular stress test  05/03/2006    EF 60%  . Upper gastrointestinal endoscopy  07/13/06  . Upper gastrointestinal endoscopy  02/19/05  . Upper gastrointestinal endoscopy  07/22/2010    EGD ED  . Colonoscopy  07/22/2010  . Hernia repair    . Esophagogastroduodenoscopy  05/26/2011    Procedure: ESOPHAGOGASTRODUODENOSCOPY (EGD);  Surgeon: Luis Houston, MD;  Location: [facility];  Service: Endoscopy;  Laterality: N/A;  3:00  . Balloon dilation  05/26/2011    Procedure: BALLOON DILATION;  Surgeon: Luis Houston, MD;  Location: [facility];  Service: Endoscopy;  Laterality: N/A;  . Esophagogastroduodenoscopy  07/21/2012    Procedure: ESOPHAGOGASTRODUODENOSCOPY (EGD);  Surgeon: Luis Houston, MD;  Location: [facility];  Service: Endoscopy;  Laterality: N/A;  325-changed to 225 Ann to notify pt  . Cholecystectomy    . Low back surgery       Allergies  Allergen Reactions  . Aspirin Swelling  . Esomeprazole Magnesium Diarrhea  . Niacin Swelling  . Penicillins Swelling      Family History  Problem Relation Age of Onset  . Heart disease Father   . Healthy Sister   . Heart disease Sister   . Stroke Sister   . Stroke Daughter   . Diabetes Daughter   . Healthy Son      Social History Luis Guerra  reports that he quit smoking about 56 years ago. He has never used smokeless tobacco. Luis Guerra reports that he does not drink alcohol.   Review of Systems CONSTITUTIONAL: No weight loss, fever, chills, weakness or fatigue.  HEENT: Eyes: No visual loss, blurred vision, double vision or yellow sclerae.No hearing loss, sneezing, congestion, runny nose or sore throat.  SKIN: No rash or itching.  CARDIOVASCULAR: per HPI RESPIRATORY: No shortness of breath, cough or sputum.  GASTROINTESTINAL: No anorexia, nausea, vomiting or diarrhea. No abdominal pain or blood.  GENITOURINARY: No burning on urination, no polyuria NEUROLOGICAL: No headache, dizziness, syncope, paralysis, ataxia, numbness or tingling in the extremities. No change in bowel or bladder control.  MUSCULOSKELETAL: No muscle, back pain, joint pain or stiffness.  LYMPHATICS: No enlarged nodes. No history of splenectomy.  PSYCHIATRIC: No history of depression or anxiety.  ENDOCRINOLOGIC: No reports of sweating, cold or heat intolerance. No polyuria or polydipsia.  Marland Kitchen   Physical Examination p 65 bp 123/53 Wt 138 lbs BMI 19 Gen: resting comfortably, no acute distress HEENT: no scleral icterus, pupils equal round and reactive, no palptable cervical adenopathy,  CV: RRR, no m/r/g, no JVD, no carotid bruits Resp: Clear to auscultation bilaterally GI: abdomen is soft, non-tender, non-distended, normal bowel sounds, no hepatosplenomegaly MSK: extremities are warm, no edema.  Skin: warm, no rash Neuro:  no focal deficits Psych: appropriate affect   Diagnostic Studies 12/2012 Echo LVEF 55-60%, mild MR, mod LVH,     Assessment and Plan   1. CAD  - no current symptoms  - continue secondary prevention and risk factor modification   2. Carotid stenosis  - known occluded right ICA from prior studies - no current neuro symptoms, continue medical therapy - seen by neuro, I agree with there assessment that chronically occluded right  ICA not related to isolated episode of slurred speech, more risk than benefit considering intervening on it. .   3. Bradycardia  - no current symptoms, continue to follow clinically   4. HTN  - at goal, continue current meds     Luis Guerra, M.D., F.A.C.C.

## 2013-10-29 NOTE — Patient Instructions (Signed)
Your physician recommends that you schedule a follow-up appointment in: 6 months with Dr Branch You will receive a reminder letter two months in advance reminding you to call and schedule your appointment. If you don't receive this letter, please contact our office.  Your physician recommends that you continue on your current medications as directed. Please refer to the Current Medication list given to you today.   

## 2013-11-20 ENCOUNTER — Encounter: Payer: Self-pay | Admitting: Neurology

## 2013-11-20 ENCOUNTER — Ambulatory Visit (INDEPENDENT_AMBULATORY_CARE_PROVIDER_SITE_OTHER): Payer: Medicare Other | Admitting: Neurology

## 2013-11-20 VITALS — BP 128/74 | HR 68 | Temp 97.4°F | Resp 20 | Ht 72.0 in | Wt 139.1 lb

## 2013-11-20 DIAGNOSIS — G459 Transient cerebral ischemic attack, unspecified: Secondary | ICD-10-CM

## 2013-11-20 DIAGNOSIS — I2510 Atherosclerotic heart disease of native coronary artery without angina pectoris: Secondary | ICD-10-CM | POA: Diagnosis not present

## 2013-11-20 NOTE — Patient Instructions (Signed)
Everything looks fine.  The only thing noted on stroke workup was that there is some tightening in one of the arteries in the neck that goes to the brain.  Sometimes, this may be amenable to surgery.  However, given your age, I feel that the risks of surgery outweigh the benefits.  Cholesterol has been good.  Continue the plavix.  No further follow up needed.  Call with questions or concerns.

## 2013-11-20 NOTE — Progress Notes (Signed)
NEUROLOGY FOLLOW UP OFFICE NOTE  Luis Guerra [id number]  HISTORY OF PRESENT ILLNESS: Luis Guerra is a 80 year old right-handed man with history of coronary artery disease with occluded D2, occluded right ICA, bradycardia, COPD, AAA, and hypertension who follows up for transient ischemic attack.  He is accompanied by his son.  Records and images were personally reviewed where available.    UPDATE: No recurrent symptoms.  Followed up with Dr. Harl Bowie in cardiology.  Repeat cardiac workup looking for cardio-embolic source not felt to be warranted.    HISTORY: On 07/03/13, he had an episode of transient speech disturbance, lasting about 30 minutes.  Suddenly, he had difficulty pronouncing words and it came out as mumbling.  He had no word-finding difficulty.  There were no focal symptoms, such as facial droop, unilateral weakness or numbness, or gait instability.  He did not have headache.  He has mild dizziness once in a while.  He was not on any antiplatelet agent at the time, as he is allergic to aspirin.  His cardiologist, Dr. Harl Bowie, started him on Plavix.  Since that time, he has not had any recurrent episodes and has been feeling well.  He staggers a little when he walks but does not feel unstable or had any falls.  He has significant history of cardiovascular disease.  He has an occluded D2, right ICA occlusion, and episode of 4 second pause in May 2014.  He has hypertension and hypothyroidism as well.   MRI of brain without contrast performed 01/15/13:  small vessel disease, global atrophy. MRA of head 01/15/13:  occluded right ICA, high-grade stenosis proximal A2 segment of right ACA. Labs May 2014:  LDL 39, Hgb A1c 5.4, TSH 2.259. 2D Echo 01/15/13:  EF 55-60% with moderate LVH. Carotid doppler 01/14/13 right ICA occlusion, 50-69% stenosis left ICA.  PAST MEDICAL HISTORY: Past Medical History  Diagnosis Date  . Coronary artery disease     WITH HISTORY OF OCCLUSION OF A DIAGONAL  BRANCH  . SBO (small bowel obstruction)   . Carotid occlusion, right   . COPD (chronic obstructive pulmonary disease)   . Gastritis   . AAA (abdominal aortic aneurysm)   . Hiatal hernia   . Weight loss   . Dysphagia   . Cancer     stomach cancer  . Hypothyroidism     MEDICATIONS: Current Outpatient Prescriptions on File Prior to Visit  Medication Sig Dispense Refill  . acetaminophen (TYLENOL) 500 MG tablet Take 500 mg by mouth 2 (two) times daily. For pain      . clopidogrel (PLAVIX) 75 MG tablet Take 1 tablet (75 mg total) by mouth daily.  90 tablet  3  . Fluticasone-Salmeterol (ADVAIR DISKUS) 250-50 MCG/DOSE AEPB Inhale 1 puff into the lungs every 12 (twelve) hours.        Marland Kitchen levothyroxine (SYNTHROID, LEVOTHROID) 125 MCG tablet Take 125 mcg by mouth daily.        Marland Kitchen losartan (COZAAR) 50 MG tablet Take 50 mg by mouth daily.      . Multiple Vitamin (MULTIVITAMIN) tablet Take 1 tablet by mouth daily.        . pantoprazole (PROTONIX) 40 MG tablet TAKE 1 TABLET BY MOUTH EVERY DAY  30 tablet  5  . Probiotic Product (ALIGN) 4 MG CAPS Take 4 mg by mouth daily.       . theophylline (THEOPHYLLINE) 300 MG 12 hr tablet Take 300 mg by mouth 2 (two) times daily.  No current facility-administered medications on file prior to visit.    ALLERGIES: Allergies  Allergen Reactions  . Aspirin Swelling  . Esomeprazole Magnesium Diarrhea  . Niacin Swelling  . Penicillins Swelling    FAMILY HISTORY: Family History  Problem Relation Age of Onset  . Heart disease Father   . Healthy Sister   . Heart disease Sister   . Stroke Sister   . Stroke Daughter   . Diabetes Daughter   . Healthy Son     SOCIAL HISTORY: History   Social History  . Marital Status: Married    Spouse Name: N/A    Number of Children: 70  . Years of Education: N/A   Occupational History  . retired     [place] tobacco   Social History Main Topics  . Smoking status: Former Smoker    Quit date: 08/30/1957    . Smokeless tobacco: Never Used     Comment: Quit smoking in 1959  . Alcohol Use: No  . Drug Use: No  . Sexual Activity: Not on file   Other Topics Concern  . Not on file   Social History Narrative  . No narrative on file    REVIEW OF SYSTEMS: Constitutional: No fevers, chills, or sweats, no generalized fatigue, change in appetite Eyes: No visual changes, double vision, eye pain Ear, nose and throat: No hearing loss, ear pain, nasal congestion, sore throat Cardiovascular: No chest pain, palpitations Respiratory:  No shortness of breath at rest or with exertion, wheezes GastrointestinaI: No nausea, vomiting, diarrhea, abdominal pain, fecal incontinence Genitourinary:  No dysuria, urinary retention or frequency Musculoskeletal:  No neck pain, back pain Integumentary: No rash, pruritus, skin lesions Neurological: as above Psychiatric: No depression, insomnia, anxiety Endocrine: No palpitations, fatigue, diaphoresis, mood swings, change in appetite, change in weight, increased thirst Hematologic/Lymphatic:  No anemia, purpura, petechiae. Allergic/Immunologic: no itchy/runny eyes, nasal congestion, recent allergic reactions, rashes  PHYSICAL EXAM: Filed Vitals:   11/20/13 0941  BP: 128/74  Pulse: 68  Temp: 97.4 F (36.3 C)  Resp: 20   General: No acute distress Head:  Normocephalic/atraumatic Neck: supple, no paraspinal tenderness, full range of motion Heart:  Regular rate and rhythm Lungs:  Clear to auscultation bilaterally Back: No paraspinal tenderness Neurological Exam: alert and oriented to person, place, and time. Attention span and concentration intact, recent and remote memory intact, fund of knowledge intact.  Speech fluent and not dysarthric, language intact.  CN II-XII intact. Fundoscopic exam unremarkable without vessel changes, exudates, hemorrhages or papilledema.  Bulk and tone normal, muscle strength 5/5 throughout.  Sensation to light touch, temperature and  vibration intact.  Deep tendon reflexes 2+ throughout, toes downgoing.  Finger to nose and heel to shin testing intact.  Gait normal, Romberg negative.  IMPRESSION: Transient ischemic attack.  The only thing noted on stroke workup was the 50-69% left ICA stenosis found on carotid doppler on 01/14/13.  This finding may change management as it may be amenable to surgery.  However, given his age, I feel that the risks of surgery would outweigh benefits.  PLAN: 1.   Continue Plavix 75mg  daily 2.  LDL goal less than 100. 3.  Follow up as needed.  15 minutes spent with patient, over 50% spent counseling, discussing lab work, carotid doppler findings and coordinating care.  Metta Clines, DO  CC:  Carlyle Dolly, MD  Delphina Cahill, MD

## 2013-12-06 DIAGNOSIS — E119 Type 2 diabetes mellitus without complications: Secondary | ICD-10-CM | POA: Diagnosis not present

## 2013-12-06 DIAGNOSIS — E1149 Type 2 diabetes mellitus with other diabetic neurological complication: Secondary | ICD-10-CM | POA: Diagnosis not present

## 2013-12-25 ENCOUNTER — Ambulatory Visit (INDEPENDENT_AMBULATORY_CARE_PROVIDER_SITE_OTHER): Payer: Medicare Other | Admitting: Orthopedic Surgery

## 2013-12-25 ENCOUNTER — Encounter: Payer: Self-pay | Admitting: Orthopedic Surgery

## 2013-12-25 VITALS — BP 180/90 | Ht 72.0 in | Wt 139.0 lb

## 2013-12-25 DIAGNOSIS — IMO0002 Reserved for concepts with insufficient information to code with codable children: Secondary | ICD-10-CM

## 2013-12-25 DIAGNOSIS — M179 Osteoarthritis of knee, unspecified: Secondary | ICD-10-CM

## 2013-12-25 DIAGNOSIS — M1710 Unilateral primary osteoarthritis, unspecified knee: Secondary | ICD-10-CM | POA: Diagnosis not present

## 2013-12-25 NOTE — Patient Instructions (Signed)

## 2013-12-25 NOTE — Progress Notes (Signed)
Patient ID: Luis Guerra, male   DOB: 10/03/1920, 80 y.o.   MRN: [id number]  Chief Complaint  Patient presents with  . Follow-up    Request Bilateral knee injections    BP 180/90  Ht 6' (1.829 m)  Wt 139 lb (63.05 kg)  BMI 18.85 kg/m2  Knee pain bilateral  Knee  Injection Procedure Note  Pre-operative Diagnosis: left knee oa  Post-operative Diagnosis: same  Indications: pain  Anesthesia: ethyl chloride   Procedure Details   Verbal consent was obtained for the procedure. Time out was completed.The joint was prepped with alcohol, followed by  Ethyl chloride spray and A 20 gauge needle was inserted into the knee via lateral approach; 58ml 1% lidocaine and 1 ml of depomedrol  was then injected into the joint . The needle was removed and the area cleansed and dressed.  Complications:  None; patient tolerated the procedure well. Knee  Injection Procedure Note  Pre-operative Diagnosis: right knee oa  Post-operative Diagnosis: same  Indications: pain  Anesthesia: ethyl chloride   Procedure Details   Verbal consent was obtained for the procedure. Time out was completed.The joint was prepped with alcohol, followed by  Ethyl chloride spray and A 20 gauge needle was inserted into the knee via lateral approach; 40ml 1% lidocaine and 1 ml of depomedrol  was then injected into the joint . The needle was removed and the area cleansed and dressed.  Complications:  None; patient tolerated the procedure well.

## 2014-01-09 DIAGNOSIS — K219 Gastro-esophageal reflux disease without esophagitis: Secondary | ICD-10-CM | POA: Diagnosis not present

## 2014-01-09 DIAGNOSIS — J449 Chronic obstructive pulmonary disease, unspecified: Secondary | ICD-10-CM | POA: Diagnosis not present

## 2014-01-09 DIAGNOSIS — I10 Essential (primary) hypertension: Secondary | ICD-10-CM | POA: Diagnosis not present

## 2014-02-11 ENCOUNTER — Other Ambulatory Visit (HOSPITAL_COMMUNITY): Payer: Self-pay | Admitting: Internal Medicine

## 2014-02-11 NOTE — Telephone Encounter (Signed)
Per Dr.Rehman may refill with 11 additional refills.

## 2014-02-14 DIAGNOSIS — Z85828 Personal history of other malignant neoplasm of skin: Secondary | ICD-10-CM | POA: Diagnosis not present

## 2014-02-14 DIAGNOSIS — I70209 Unspecified atherosclerosis of native arteries of extremities, unspecified extremity: Secondary | ICD-10-CM | POA: Diagnosis not present

## 2014-02-14 DIAGNOSIS — L570 Actinic keratosis: Secondary | ICD-10-CM | POA: Diagnosis not present

## 2014-03-12 ENCOUNTER — Encounter (INDEPENDENT_AMBULATORY_CARE_PROVIDER_SITE_OTHER): Payer: Self-pay | Admitting: Internal Medicine

## 2014-03-12 ENCOUNTER — Ambulatory Visit (INDEPENDENT_AMBULATORY_CARE_PROVIDER_SITE_OTHER): Payer: Medicare Other | Admitting: Internal Medicine

## 2014-03-12 VITALS — BP 118/70 | HR 68 | Temp 97.0°F | Resp 18 | Ht 72.0 in | Wt 135.5 lb

## 2014-03-12 DIAGNOSIS — K2100 Gastro-esophageal reflux disease with esophagitis, without bleeding: Secondary | ICD-10-CM

## 2014-03-12 DIAGNOSIS — G459 Transient cerebral ischemic attack, unspecified: Secondary | ICD-10-CM | POA: Diagnosis not present

## 2014-03-12 DIAGNOSIS — R634 Abnormal weight loss: Secondary | ICD-10-CM | POA: Diagnosis not present

## 2014-03-12 NOTE — Progress Notes (Signed)
Presenting complaint;  Followup for GERD and weight loss.  Subjective:  Patient is 80 year old Caucasian male who is here for scheduled visit accompanied by his son. He was last seen 6 months ago. He has no complaints but he has lost another 7 pounds since his last visit. He believes he has good appetite and eats well. His son states that when they go to eat he usually eats well. He has sporadic nausea and he has had 3 episodes of vomiting in the last 6 months. No history of melena rectal bleeding or hematemesis. On most days he has 1-2 formed stools. He stays busy. He does yard work. He does not have problems with disequilibrium.   Current Medications: Outpatient Encounter Prescriptions as of 03/12/2014  Medication Sig  . acetaminophen (TYLENOL) 500 MG tablet Take 500 mg by mouth 2 (two) times daily. For pain  . clopidogrel (PLAVIX) 75 MG tablet Take 1 tablet (75 mg total) by mouth daily.  . Fluticasone-Salmeterol (ADVAIR DISKUS) 250-50 MCG/DOSE AEPB Inhale 1 puff into the lungs every 12 (twelve) hours.    Marland Kitchen levothyroxine (SYNTHROID, LEVOTHROID) 125 MCG tablet Take 125 mcg by mouth daily.    Marland Kitchen losartan (COZAAR) 50 MG tablet Take 50 mg by mouth daily.  . Multiple Vitamin (MULTIVITAMIN) tablet Take 1 tablet by mouth daily.    . pantoprazole (PROTONIX) 40 MG tablet TAKE 1 TABLET BY MOUTH EVERY DAY  . Probiotic Product (ALIGN) 4 MG CAPS Take 4 mg by mouth daily.   . theophylline (THEOPHYLLINE) 300 MG 12 hr tablet Take 300 mg by mouth 2 (two) times daily.       Objective: Blood pressure 118/70, pulse 68, temperature 97 F (36.1 C), temperature source Oral, resp. rate 18, height 6' (1.829 m), weight 135 lb 8 oz (61.462 kg). Patient is alert and in no acute distress. Conjunctiva is pink. Sclera is nonicteric Oropharyngeal mucosa is normal. No neck masses or thyromegaly noted. Cardiac exam with regular rhythm normal S1 and S2. Faint systolic ejection murmur best heard at aortic area. Lungs  are clear to auscultation. Abdomen symmetrical. On palpation abdominal wall and epigastric is irregular secondary to mass. It is however soft and nontender without organomegaly or masses.  No LE edema or clubbing noted.   Assessment:  #1. GERD complicated by esophageal stricture. Heartburn is well controlled with therapy and he has not explain dysphagia since last esophageal dilation on November 2013. #2. Continued weight loss. He appears to have good appetite. He does not have any symptoms suggest malabsorption. He has markedly dilated stomach and duodenum on prior imaging studies and patent gastrojejunostomy. #3. History of gastric carcinoma. Status post endoscopic resection at [facility] and October 2012 and he remains in remission.   Plan:  Patient advised to eat snacks between meals or 6 small meals daily. He will stop by for weight check in 2 months. Office visit in 6 months.

## 2014-03-12 NOTE — Patient Instructions (Signed)
Six small meals daily. Weight check in 2 months. Call if you have abdominal pain vomiting or tarry stool

## 2014-03-15 DIAGNOSIS — H6120 Impacted cerumen, unspecified ear: Secondary | ICD-10-CM | POA: Diagnosis not present

## 2014-03-15 DIAGNOSIS — H918X9 Other specified hearing loss, unspecified ear: Secondary | ICD-10-CM | POA: Diagnosis not present

## 2014-04-05 DIAGNOSIS — I10 Essential (primary) hypertension: Secondary | ICD-10-CM | POA: Diagnosis not present

## 2014-04-05 DIAGNOSIS — E039 Hypothyroidism, unspecified: Secondary | ICD-10-CM | POA: Diagnosis not present

## 2014-04-09 DIAGNOSIS — J449 Chronic obstructive pulmonary disease, unspecified: Secondary | ICD-10-CM | POA: Diagnosis not present

## 2014-04-09 DIAGNOSIS — I10 Essential (primary) hypertension: Secondary | ICD-10-CM | POA: Diagnosis not present

## 2014-04-09 DIAGNOSIS — E039 Hypothyroidism, unspecified: Secondary | ICD-10-CM | POA: Diagnosis not present

## 2014-04-09 DIAGNOSIS — E875 Hyperkalemia: Secondary | ICD-10-CM | POA: Diagnosis not present

## 2014-04-25 ENCOUNTER — Telehealth: Payer: Self-pay | Admitting: *Deleted

## 2014-04-25 ENCOUNTER — Ambulatory Visit (INDEPENDENT_AMBULATORY_CARE_PROVIDER_SITE_OTHER): Payer: Medicare Other | Admitting: Cardiology

## 2014-04-25 ENCOUNTER — Encounter: Payer: Self-pay | Admitting: Cardiology

## 2014-04-25 VITALS — BP 130/68 | HR 60 | Ht 72.0 in | Wt 137.0 lb

## 2014-04-25 DIAGNOSIS — I2510 Atherosclerotic heart disease of native coronary artery without angina pectoris: Secondary | ICD-10-CM

## 2014-04-25 DIAGNOSIS — I10 Essential (primary) hypertension: Secondary | ICD-10-CM | POA: Diagnosis not present

## 2014-04-25 DIAGNOSIS — R001 Bradycardia, unspecified: Secondary | ICD-10-CM

## 2014-04-25 DIAGNOSIS — I498 Other specified cardiac arrhythmias: Secondary | ICD-10-CM

## 2014-04-25 MED ORDER — LOSARTAN POTASSIUM 25 MG PO TABS: 25.0000 mg | ORAL_TABLET | Freq: Every day | ORAL | Status: DC

## 2014-04-25 NOTE — Progress Notes (Signed)
Clinical Summary Luis Guerra is a 80 y.o.male seen today for follow up of the following medical problems.  1. CAD  - last cath 2008 with occluded D2, has been medically managed  - he is not on ASA because of allergy, on plavix instead.  - no chest pain, no SOB or DOE   2. Carotid stenosis  - occluded RICA by Doppler 12/2012  - medically managed   3. Bradycardia  - per notes noted to have a 4 second pause during a prior hospitalization 12/2012, was not felt to be a good candidate for a pacemaker at that time.  - TSH 12/2012 2.3  - can have some orthostatic dizziness, occurs daily  4. TIA  - November 4th episode of slurred speech that lasted approx 30 minutes. No facial droop, no other focal neuro changes. No repeat episodes since.  - patient refused to go to the hospital  - referred to neuro last visit, agree with starting plavix. Do not feel carotid disease is of significance.   - no recurrent symptoms   5. HTN  - compliant with meds - checks at home occasionally, typically 120s/70s   Past Medical History  Diagnosis Date  . Coronary artery disease     WITH HISTORY OF OCCLUSION OF A DIAGONAL Luis Guerra  . SBO (small bowel obstruction)   . Carotid occlusion, right   . COPD (chronic obstructive pulmonary disease)   . Gastritis   . AAA (abdominal aortic aneurysm)   . Hiatal hernia   . Weight loss   . Dysphagia   . Cancer     stomach cancer  . Hypothyroidism      Allergies  Allergen Reactions  . Aspirin Swelling  . Esomeprazole Magnesium Diarrhea  . Niacin Swelling  . Penicillins Swelling     Current Outpatient Prescriptions  Medication Sig Dispense Refill  . acetaminophen (TYLENOL) 500 MG tablet Take 500 mg by mouth 2 (two) times daily. For pain      . clopidogrel (PLAVIX) 75 MG tablet Take 1 tablet (75 mg total) by mouth daily.  90 tablet  3  . Fluticasone-Salmeterol (ADVAIR DISKUS) 250-50 MCG/DOSE AEPB Inhale 1 puff into the lungs every 12 (twelve) hours.         Marland Kitchen levothyroxine (SYNTHROID, LEVOTHROID) 125 MCG tablet Take 125 mcg by mouth daily.        Marland Kitchen losartan (COZAAR) 50 MG tablet Take 50 mg by mouth daily.      . Multiple Vitamin (MULTIVITAMIN) tablet Take 1 tablet by mouth daily.        . pantoprazole (PROTONIX) 40 MG tablet TAKE 1 TABLET BY MOUTH EVERY DAY  30 tablet  11  . Probiotic Product (ALIGN) 4 MG CAPS Take 4 mg by mouth daily.       . theophylline (THEOPHYLLINE) 300 MG 12 hr tablet Take 300 mg by mouth 2 (two) times daily.         No current facility-administered medications for this visit.     Past Surgical History  Procedure Laterality Date  . Cardiac catheterization  06/08/2007    EF 55%  . Abdominal aortic aneurysm repair    . Cardiovascular stress test  05/03/2006    EF 60%  . Upper gastrointestinal endoscopy  07/13/06  . Upper gastrointestinal endoscopy  02/19/05  . Upper gastrointestinal endoscopy  07/22/2010    EGD ED  . Colonoscopy  07/22/2010  . Hernia repair    . Esophagogastroduodenoscopy  05/26/2011  Procedure: ESOPHAGOGASTRODUODENOSCOPY (EGD);  Surgeon: Rogene Houston, MD;  [facility];  Service: Endoscopy;  Laterality: N/A;  3:00  . Balloon dilation  05/26/2011    Procedure: BALLOON DILATION;  Surgeon: Rogene Houston, MD;  Location: [facility];  Service: Endoscopy;  Laterality: N/A;  . Esophagogastroduodenoscopy  07/21/2012    Procedure: ESOPHAGOGASTRODUODENOSCOPY (EGD);  Surgeon: Rogene Houston, MD;  [facility]: [facility];  Service: Endoscopy;  Laterality: N/A;  325-changed to 225 Ann to notify pt  . Cholecystectomy    . Low back surgery       Allergies  Allergen Reactions  . Aspirin Swelling  . Esomeprazole Magnesium Diarrhea  . Niacin Swelling  . Penicillins Swelling      Family History  Problem Relation Age of Onset  . Heart disease Father   . Healthy Sister   . Heart disease Sister   . Stroke Sister   . Stroke Daughter   . Diabetes Daughter   . Healthy Son       Social History Luis Guerra reports that he quit smoking about 56 years ago. He has never used smokeless tobacco. Luis Guerra reports that he does not drink alcohol.   Review of Systems CONSTITUTIONAL: No weight loss, fever, chills, weakness or fatigue.  HEENT: Eyes: No visual loss, blurred vision, double vision or yellow sclerae.No hearing loss, sneezing, congestion, runny nose or sore throat.  SKIN: No rash or itching.  CARDIOVASCULAR: per HPI RESPIRATORY: No shortness of breath, cough or sputum.  GASTROINTESTINAL: No anorexia, nausea, vomiting or diarrhea. No abdominal pain or blood.  GENITOURINARY: No burning on urination, no polyuria NEUROLOGICAL: occas dizziness MUSCULOSKELETAL: No muscle, back pain, joint pain or stiffness.  LYMPHATICS: No enlarged nodes. No history of splenectomy.  PSYCHIATRIC: No history of depression or anxiety.  ENDOCRINOLOGIC: No reports of sweating, cold or heat intolerance. No polyuria or polydipsia.  Marland Kitchen   Physical Examination p 60 bp 130/68 Wt 137 lbs BMI 19 Gen: resting comfortably, no acute distress HEENT: no scleral icterus, pupils equal round and reactive, no palptable cervical adenopathy,  CV: RRR, 2/6 systolic murmur at apex, no JVD Resp: Clear to auscultation bilaterally GI: abdomen is soft, non-tender, non-distended, normal bowel sounds, no hepatosplenomegaly MSK: extremities are warm, no edema.  Skin: warm, no rash Neuro:  no focal deficits Psych: appropriate affect   Diagnostic Studies 12/2012 Echo  LVEF 55-60%, mild MR, mod LVH,      Assessment and Plan  1. CAD  - no current symptoms  - continue secondary prevention and risk factor modification   2. Carotid stenosis  - known occluded right ICA from prior studies  - no current neuro symptoms, continue medical therapy  - seen by neuro, I agree with there assessment that chronically occluded right ICA not related to isolated episode of slurred speech, more risk than benefit  considering intervening on it. .   3. Bradycardia  - occasional orthostatic dizziness only, will decrease losartan to 25mg  daily   4. HTN  - at goal, decrease losartan due to orthostatic dizziness    F/u 1 year   Arnoldo Lenis, M.D., F.A.C.C.

## 2014-04-25 NOTE — Telephone Encounter (Signed)
PT was seen today and told to decrease losartan, He does not think he was ever taking it. He doesn't have it at home.

## 2014-04-25 NOTE — Patient Instructions (Signed)
Your physician wants you to follow-up in: 1 year You will receive a reminder letter in the mail two months in advance. If you don't receive a letter, please call our office to schedule the follow-up appointment.    Your physician has recommended you make the following change in your medication:     DECREASE Losartan to 25 mg daily      Thank you for choosing [facility] !

## 2014-04-25 NOTE — Telephone Encounter (Signed)
Spoke to pt and explained that he has new RX for losartan and pt said he would be picking it up today. Pt understood

## 2014-04-29 DIAGNOSIS — L609 Nail disorder, unspecified: Secondary | ICD-10-CM | POA: Diagnosis not present

## 2014-04-29 DIAGNOSIS — M79609 Pain in unspecified limb: Secondary | ICD-10-CM | POA: Diagnosis not present

## 2014-04-29 DIAGNOSIS — I739 Peripheral vascular disease, unspecified: Secondary | ICD-10-CM | POA: Diagnosis not present

## 2014-04-29 DIAGNOSIS — G609 Hereditary and idiopathic neuropathy, unspecified: Secondary | ICD-10-CM | POA: Diagnosis not present

## 2014-04-29 DIAGNOSIS — B351 Tinea unguium: Secondary | ICD-10-CM | POA: Diagnosis not present

## 2014-04-29 DIAGNOSIS — L851 Acquired keratosis [keratoderma] palmaris et plantaris: Secondary | ICD-10-CM | POA: Diagnosis not present

## 2014-05-09 ENCOUNTER — Ambulatory Visit (INDEPENDENT_AMBULATORY_CARE_PROVIDER_SITE_OTHER): Payer: Medicare Other | Admitting: Orthopedic Surgery

## 2014-05-09 VITALS — Ht 72.0 in | Wt 137.0 lb

## 2014-05-09 DIAGNOSIS — M1710 Unilateral primary osteoarthritis, unspecified knee: Secondary | ICD-10-CM | POA: Diagnosis not present

## 2014-05-09 DIAGNOSIS — M170 Bilateral primary osteoarthritis of knee: Secondary | ICD-10-CM

## 2014-05-10 NOTE — Progress Notes (Signed)
Chief Complaint  Patient presents with  . Injections    requests bilateral knee injections   Knee  Injection Procedure Note  Pre-operative Diagnosis: left knee oa  Post-operative Diagnosis: same  Indications: pain  Anesthesia: ethyl chloride   Procedure Details   Verbal consent was obtained for the procedure. Time out was completed.The joint was prepped with alcohol, followed by  Ethyl chloride spray and A 20 gauge needle was inserted into the knee via lateral approach; 39ml 1% lidocaine and 1 ml of depomedrol  was then injected into the joint . The needle was removed and the area cleansed and dressed.  Complications:  None; patient tolerated the procedure well. Knee  Injection Procedure Note  Pre-operative Diagnosis: right knee oa  Post-operative Diagnosis: same  Indications: pain  Anesthesia: ethyl chloride   Procedure Details   Verbal consent was obtained for the procedure. Time out was completed.The joint was prepped with alcohol, followed by  Ethyl chloride spray and A 20 gauge needle was inserted into the knee via lateral approach; 36ml 1% lidocaine and 1 ml of depomedrol  was then injected into the joint . The needle was removed and the area cleansed and dressed.  Complications:  None; patient tolerated the procedure well.

## 2014-05-23 ENCOUNTER — Telehealth (INDEPENDENT_AMBULATORY_CARE_PROVIDER_SITE_OTHER): Payer: Self-pay | Admitting: *Deleted

## 2014-05-23 DIAGNOSIS — E875 Hyperkalemia: Secondary | ICD-10-CM | POA: Diagnosis not present

## 2014-05-23 DIAGNOSIS — Z23 Encounter for immunization: Secondary | ICD-10-CM | POA: Diagnosis not present

## 2014-05-23 DIAGNOSIS — T148XXA Other injury of unspecified body region, initial encounter: Secondary | ICD-10-CM | POA: Diagnosis not present

## 2014-05-23 DIAGNOSIS — M549 Dorsalgia, unspecified: Secondary | ICD-10-CM | POA: Diagnosis not present

## 2014-05-23 NOTE — Telephone Encounter (Signed)
Random came by the office for a weight check.  135.7 lbs. His return phone number is [phone].

## 2014-05-24 NOTE — Telephone Encounter (Signed)
Dr.Rehman was made aware. Forwarded to [place] for recommendation.

## 2014-07-08 DIAGNOSIS — L609 Nail disorder, unspecified: Secondary | ICD-10-CM | POA: Diagnosis not present

## 2014-07-08 DIAGNOSIS — M79671 Pain in right foot: Secondary | ICD-10-CM | POA: Diagnosis not present

## 2014-07-08 DIAGNOSIS — L851 Acquired keratosis [keratoderma] palmaris et plantaris: Secondary | ICD-10-CM | POA: Diagnosis not present

## 2014-07-08 DIAGNOSIS — B351 Tinea unguium: Secondary | ICD-10-CM | POA: Diagnosis not present

## 2014-07-08 DIAGNOSIS — I739 Peripheral vascular disease, unspecified: Secondary | ICD-10-CM | POA: Diagnosis not present

## 2014-07-08 DIAGNOSIS — G629 Polyneuropathy, unspecified: Secondary | ICD-10-CM | POA: Diagnosis not present

## 2014-07-11 ENCOUNTER — Other Ambulatory Visit: Payer: Self-pay | Admitting: Cardiology

## 2014-07-11 MED ORDER — CLOPIDOGREL BISULFATE 75 MG PO TABS: 75.0000 mg | ORAL_TABLET | Freq: Every day | ORAL | Status: DC

## 2014-07-11 NOTE — Telephone Encounter (Signed)
Received fax refill request  Rx # [id number] Medication:  Clopidogrel 75 mg tablets Qty 90 Sig:  Take one tablet by mouth every day  Physician:  Harl Bowie

## 2014-07-11 NOTE — Telephone Encounter (Signed)
Refill complete 

## 2014-07-22 ENCOUNTER — Emergency Department (HOSPITAL_COMMUNITY)
Admission: EM | Admit: 2014-07-22 | Discharge: 2014-07-22 | Disposition: A | Discharge status: Home / Self Care | Payer: Medicare Other | Attending: Emergency Medicine | Admitting: Emergency Medicine

## 2014-07-22 ENCOUNTER — Encounter (HOSPITAL_COMMUNITY): Payer: Self-pay

## 2014-07-22 DIAGNOSIS — Z79899 Other long term (current) drug therapy: Secondary | ICD-10-CM | POA: Diagnosis not present

## 2014-07-22 DIAGNOSIS — Z85028 Personal history of other malignant neoplasm of stomach: Secondary | ICD-10-CM | POA: Insufficient documentation

## 2014-07-22 DIAGNOSIS — Z880 Allergy status to penicillin: Secondary | ICD-10-CM | POA: Diagnosis not present

## 2014-07-22 DIAGNOSIS — Z87891 Personal history of nicotine dependence: Secondary | ICD-10-CM | POA: Insufficient documentation

## 2014-07-22 DIAGNOSIS — M25552 Pain in left hip: Secondary | ICD-10-CM | POA: Diagnosis present

## 2014-07-22 DIAGNOSIS — Z9889 Other specified postprocedural states: Secondary | ICD-10-CM | POA: Diagnosis not present

## 2014-07-22 DIAGNOSIS — Z7902 Long term (current) use of antithrombotics/antiplatelets: Secondary | ICD-10-CM | POA: Insufficient documentation

## 2014-07-22 DIAGNOSIS — J449 Chronic obstructive pulmonary disease, unspecified: Secondary | ICD-10-CM | POA: Insufficient documentation

## 2014-07-22 DIAGNOSIS — M76892 Other specified enthesopathies of left lower limb, excluding foot: Secondary | ICD-10-CM | POA: Diagnosis not present

## 2014-07-22 DIAGNOSIS — M769 Unspecified enthesopathy, lower limb, excluding foot: Secondary | ICD-10-CM | POA: Diagnosis not present

## 2014-07-22 DIAGNOSIS — Z7951 Long term (current) use of inhaled steroids: Secondary | ICD-10-CM | POA: Insufficient documentation

## 2014-07-22 DIAGNOSIS — I2510 Atherosclerotic heart disease of native coronary artery without angina pectoris: Secondary | ICD-10-CM | POA: Diagnosis not present

## 2014-07-22 MED ORDER — PREDNISONE 20 MG PO TABS: 20.0000 mg | ORAL_TABLET | Freq: Every day | ORAL | Status: DC

## 2014-07-22 MED ORDER — PREDNISONE 20 MG PO TABS
20.0000 mg | ORAL_TABLET | Freq: Once | ORAL | Status: AC
  Administered 2014-07-22: 20 mg via ORAL
  Filled 2014-07-22: qty 1

## 2014-07-22 MED ORDER — ACETAMINOPHEN 500 MG PO TABS
1000.0000 mg | ORAL_TABLET | Freq: Once | ORAL | Status: AC
  Administered 2014-07-22: 1000 mg via ORAL
  Filled 2014-07-22: qty 2

## 2014-07-22 NOTE — ED Notes (Signed)
C.o left hip pain after mopping his floor 3 days ago. Denies any fall or injury. Pain increases with movement.

## 2014-07-22 NOTE — Discharge Instructions (Signed)
Take Tylenol every 4-6 hours. Prescription given for prednisone. Ice pack to buttocks. If symptoms do not improve, follow-up with orthopedic doctor. Phone number given.

## 2014-07-22 NOTE — ED Notes (Signed)
Patient given discharge instruction, verbalized understand. Patient ambulatory out of the department.  

## 2014-07-22 NOTE — ED Provider Notes (Signed)
CSN: [id number]     Arrival date & time 07/22/14  1242 History   First MD Initiated Contact with Patient 07/22/14 1358     Chief Complaint  Patient presents with  . Hip Pain     (Consider location/radiation/quality/duration/timing/severity/associated sxs/prior Treatment) HPI.... Left posterior lateral buttocks discomfort after excessive mopping in his kitchen. No trauma. Patient is ambulatory, but pain increases with movement. Severity is mild to moderate. No other complaints. No radiation to leg  Past Medical History  Diagnosis Date  . Coronary artery disease     WITH HISTORY OF OCCLUSION OF A DIAGONAL BRANCH  . SBO (small bowel obstruction)   . Carotid occlusion, right   . COPD (chronic obstructive pulmonary disease)   . Gastritis   . AAA (abdominal aortic aneurysm)   . Hiatal hernia   . Weight loss   . Dysphagia   . Cancer     stomach cancer  . Hypothyroidism    Past Surgical History  Procedure Laterality Date  . Cardiac catheterization  06/08/2007    EF 55%  . Abdominal aortic aneurysm repair    . Cardiovascular stress test  05/03/2006    EF 60%  . Upper gastrointestinal endoscopy  07/13/06  . Upper gastrointestinal endoscopy  02/19/05  . Upper gastrointestinal endoscopy  07/22/2010    EGD ED  . Colonoscopy  07/22/2010  . Hernia repair    . Esophagogastroduodenoscopy  05/26/2011    Procedure: ESOPHAGOGASTRODUODENOSCOPY (EGD);  Surgeon: Rogene Houston, MD;  [facility];  Service: Endoscopy;  Laterality: N/A;  3:00  . Balloon dilation  05/26/2011    Procedure: BALLOON DILATION;  Surgeon: Rogene Houston, MD;  [facility];  Service: Endoscopy;  Laterality: N/A;  . Esophagogastroduodenoscopy  07/21/2012    Procedure: ESOPHAGOGASTRODUODENOSCOPY (EGD);  Surgeon: Rogene Houston, MD;  [facility];  Service: Endoscopy;  Laterality: N/A;  325-changed to 225 Ann to notify pt  . Cholecystectomy    . Low back surgery     Family History   Problem Relation Age of Onset  . Heart disease Father   . Healthy Sister   . Heart disease Sister   . Stroke Sister   . Stroke Daughter   . Diabetes Daughter   . Healthy Son    History  Substance Use Topics  . Smoking status: Former Smoker    Quit date: 08/30/1957  . Smokeless tobacco: Never Used     Comment: Quit smoking in 1959  . Alcohol Use: No    Review of Systems  All other systems reviewed and are negative.     Allergies  Aspirin; Esomeprazole magnesium; Niacin; and Penicillins  Home Medications   Prior to Admission medications   Medication Sig Start Date End Date Taking? Authorizing Provider  acetaminophen (TYLENOL) 500 MG tablet Take 500 mg by mouth 2 (two) times daily. For pain   Yes Historical Provider, MD  clopidogrel (PLAVIX) 75 MG tablet Take 1 tablet (75 mg total) by mouth daily. 07/11/14  Yes Arnoldo Lenis, MD  Fluticasone-Salmeterol (ADVAIR DISKUS) 250-50 MCG/DOSE AEPB Inhale 1 puff into the lungs every 12 (twelve) hours.     Yes Historical Provider, MD  levothyroxine (SYNTHROID, LEVOTHROID) 125 MCG tablet Take 125 mcg by mouth daily.     Yes Historical Provider, MD  losartan (COZAAR) 25 MG tablet Take 1 tablet (25 mg total) by mouth daily. 04/25/14  Yes Arnoldo Lenis, MD  Multiple Vitamin (MULTIVITAMIN) tablet Take 1  tablet by mouth daily.     Yes Historical Provider, MD  pantoprazole (PROTONIX) 40 MG tablet TAKE 1 TABLET BY MOUTH EVERY DAY 02/11/14  Yes Rogene Houston, MD  Probiotic Product (ALIGN) 4 MG CAPS Take 4 mg by mouth daily.    Yes Historical Provider, MD  theophylline (THEOPHYLLINE) 300 MG 12 hr tablet Take 300 mg by mouth 2 (two) times daily.     Yes Historical Provider, MD  predniSONE (DELTASONE) 20 MG tablet Take 1 tablet (20 mg total) by mouth daily with breakfast. 07/22/14   Nat Christen, MD   BP 157/77 mmHg  Pulse 59  Temp(Src) 97.7 F (36.5 C) (Oral)  Resp 16  Ht 6' (1.829 m)  Wt 135 lb (61.236 kg)  BMI 18.31 kg/m2  SpO2  100% Physical Exam  Constitutional: He is oriented to person, place, and time. He appears well-developed and well-nourished.  HENT:  Head: Normocephalic and atraumatic.  Eyes: Conjunctivae and EOM are normal. Pupils are equal, round, and reactive to light.  Neck: Normal range of motion. Neck supple.  Cardiovascular: Normal rate, regular rhythm and normal heart sounds.   Pulmonary/Chest: Effort normal and breath sounds normal.  Abdominal: Soft. Bowel sounds are normal.  Musculoskeletal:  Tender in left lateral posterior buttocks  Neurological: He is alert and oriented to person, place, and time.  Skin: Skin is warm and dry.  Psychiatric: He has a normal mood and affect. His behavior is normal.  Nursing note and vitals reviewed.   ED Course  Procedures (including critical care time) Labs Review Labs Reviewed - No data to display  Imaging Review No results found.   EKG Interpretation None      MDM   Final diagnoses:  Hip tendonitis, left    Patient did not want x-ray. History and physical consistent with tendinitis. Rx prednisone 20 mg daily for 10 days. Also take Tylenol. Follow-up with orthopedics if not improving    Nat Christen, MD 07/22/14 1434

## 2014-08-14 DIAGNOSIS — M545 Low back pain: Secondary | ICD-10-CM | POA: Diagnosis not present

## 2014-09-02 DIAGNOSIS — D485 Neoplasm of uncertain behavior of skin: Secondary | ICD-10-CM | POA: Diagnosis not present

## 2014-09-02 DIAGNOSIS — C4442 Squamous cell carcinoma of skin of scalp and neck: Secondary | ICD-10-CM | POA: Diagnosis not present

## 2014-09-02 DIAGNOSIS — D0439 Carcinoma in situ of skin of other parts of face: Secondary | ICD-10-CM | POA: Diagnosis not present

## 2014-09-02 DIAGNOSIS — Z85828 Personal history of other malignant neoplasm of skin: Secondary | ICD-10-CM | POA: Diagnosis not present

## 2014-09-02 DIAGNOSIS — L570 Actinic keratosis: Secondary | ICD-10-CM | POA: Diagnosis not present

## 2014-09-02 DIAGNOSIS — L820 Inflamed seborrheic keratosis: Secondary | ICD-10-CM | POA: Diagnosis not present

## 2014-09-09 ENCOUNTER — Encounter (HOSPITAL_COMMUNITY): Payer: Self-pay | Admitting: *Deleted

## 2014-09-09 ENCOUNTER — Emergency Department (HOSPITAL_COMMUNITY)
Admission: EM | Admit: 2014-09-09 | Discharge: 2014-09-09 | Disposition: A | Discharge status: Home / Self Care | Payer: Medicare Other | Attending: Emergency Medicine | Admitting: Emergency Medicine

## 2014-09-09 DIAGNOSIS — Z7951 Long term (current) use of inhaled steroids: Secondary | ICD-10-CM | POA: Diagnosis not present

## 2014-09-09 DIAGNOSIS — I2510 Atherosclerotic heart disease of native coronary artery without angina pectoris: Secondary | ICD-10-CM | POA: Insufficient documentation

## 2014-09-09 DIAGNOSIS — I714 Abdominal aortic aneurysm, without rupture: Secondary | ICD-10-CM | POA: Insufficient documentation

## 2014-09-09 DIAGNOSIS — E039 Hypothyroidism, unspecified: Secondary | ICD-10-CM | POA: Diagnosis not present

## 2014-09-09 DIAGNOSIS — Z880 Allergy status to penicillin: Secondary | ICD-10-CM | POA: Insufficient documentation

## 2014-09-09 DIAGNOSIS — R42 Dizziness and giddiness: Secondary | ICD-10-CM | POA: Insufficient documentation

## 2014-09-09 DIAGNOSIS — Z85028 Personal history of other malignant neoplasm of stomach: Secondary | ICD-10-CM | POA: Diagnosis not present

## 2014-09-09 DIAGNOSIS — Z79899 Other long term (current) drug therapy: Secondary | ICD-10-CM | POA: Diagnosis not present

## 2014-09-09 DIAGNOSIS — Z87891 Personal history of nicotine dependence: Secondary | ICD-10-CM | POA: Diagnosis not present

## 2014-09-09 DIAGNOSIS — J449 Chronic obstructive pulmonary disease, unspecified: Secondary | ICD-10-CM | POA: Insufficient documentation

## 2014-09-09 DIAGNOSIS — Z8719 Personal history of other diseases of the digestive system: Secondary | ICD-10-CM | POA: Diagnosis not present

## 2014-09-09 LAB — CBC WITH DIFFERENTIAL/PLATELET
BASOS ABS: 0 10*3/uL (ref 0.0–0.1)
Basophils Relative: 1 % (ref 0–1)
Eosinophils Absolute: 0.6 10*3/uL (ref 0.0–0.7)
Eosinophils Relative: 9 % — ABNORMAL HIGH (ref 0–5)
HEMATOCRIT: 40.4 % (ref 39.0–52.0)
Hemoglobin: 13.3 g/dL (ref 13.0–17.0)
Lymphocytes Relative: 17 % (ref 12–46)
Lymphs Abs: 1 10*3/uL (ref 0.7–4.0)
MCH: 30.9 pg (ref 26.0–34.0)
MCHC: 32.9 g/dL (ref 30.0–36.0)
MCV: 93.7 fL (ref 78.0–100.0)
MONOS PCT: 9 % (ref 3–12)
Monocytes Absolute: 0.5 10*3/uL (ref 0.1–1.0)
NEUTROS ABS: 4.1 10*3/uL (ref 1.7–7.7)
Neutrophils Relative %: 64 % (ref 43–77)
Platelets: 203 10*3/uL (ref 150–400)
RBC: 4.31 MIL/uL (ref 4.22–5.81)
RDW: 14.7 % (ref 11.5–15.5)
WBC: 6.3 10*3/uL (ref 4.0–10.5)

## 2014-09-09 LAB — URINALYSIS, ROUTINE W REFLEX MICROSCOPIC
BILIRUBIN URINE: NEGATIVE
GLUCOSE, UA: NEGATIVE mg/dL
HGB URINE DIPSTICK: NEGATIVE
Ketones, ur: NEGATIVE mg/dL
Leukocytes, UA: NEGATIVE
Nitrite: NEGATIVE
PH: 6 (ref 5.0–8.0)
Protein, ur: NEGATIVE mg/dL
Specific Gravity, Urine: 1.015 (ref 1.005–1.030)
UROBILINOGEN UA: 0.2 mg/dL (ref 0.0–1.0)

## 2014-09-09 LAB — BASIC METABOLIC PANEL: Anion gap: 16 — ABNORMAL HIGH (ref 5–15)

## 2014-09-09 LAB — TROPONIN I: Troponin I: <0.03 ng/mL (ref <0.031)

## 2014-09-09 MED ORDER — SODIUM CHLORIDE 0.9 % IV BOLUS (SEPSIS)
1000.0000 mL | Freq: Once | INTRAVENOUS | Status: AC
  Administered 2014-09-09: 1000 mL via INTRAVENOUS

## 2014-09-09 NOTE — Discharge Instructions (Signed)
Dizziness °Dizziness is a common problem. It is a feeling of unsteadiness or light-headedness. You may feel like you are about to faint. Dizziness can lead to injury if you stumble or fall. A person of any age group can suffer from dizziness, but dizziness is more common in older adults. °CAUSES  °Dizziness can be caused by many different things, including: °· Middle ear problems. °· Standing for too long. °· Infections. °· An allergic reaction. °· Aging. °· An emotional response to something, such as the sight of blood. °· Side effects of medicines. °· Tiredness. °· Problems with circulation or blood pressure. °· Excessive use of alcohol or medicines, or illegal drug use. °· Breathing too fast (hyperventilation). °· An irregular heart rhythm (arrhythmia). °· A low red blood cell count (anemia). °· Pregnancy. °· Vomiting, diarrhea, fever, or other illnesses that cause body fluid loss (dehydration). °· Diseases or conditions such as Parkinson's disease, high blood pressure (hypertension), diabetes, and thyroid problems. °· Exposure to extreme heat. °DIAGNOSIS  °Your health care provider will ask about your symptoms, perform a physical exam, and perform an electrocardiogram (ECG) to record the electrical activity of your heart. Your health care provider may also perform other heart or blood tests to determine the cause of your dizziness. These may include: °· Transthoracic echocardiogram (TTE). During echocardiography, sound waves are used to evaluate how blood flows through your heart. °· Transesophageal echocardiogram (TEE). °· Cardiac monitoring. This allows your health care provider to monitor your heart rate and rhythm in real time. °· Holter monitor. This is a portable device that records your heartbeat and can help diagnose heart arrhythmias. It allows your health care provider to track your heart activity for several days if needed. °· Stress tests by exercise or by giving medicine that makes the heart beat  faster. °TREATMENT  °Treatment of dizziness depends on the cause of your symptoms and can vary greatly. °HOME CARE INSTRUCTIONS  °· Drink enough fluids to keep your urine clear or pale yellow. This is especially important in very hot weather. In older adults, it is also important in cold weather. °· Take your medicine exactly as directed if your dizziness is caused by medicines. When taking blood pressure medicines, it is especially important to get up slowly. °· Rise slowly from chairs and steady yourself until you feel okay. °· In the morning, first sit up on the side of the bed. When you feel okay, stand slowly while holding onto something until you know your balance is fine. °· Move your legs often if you need to stand in one place for a long time. Tighten and relax your muscles in your legs while standing. °· Have someone stay with you for 1-2 days if dizziness continues to be a problem. Do this until you feel you are well enough to stay alone. Have the person call your health care provider if he or she notices changes in you that are concerning. °· Do not drive or use heavy machinery if you feel dizzy. °· Do not drink alcohol. °SEEK IMMEDIATE MEDICAL CARE IF:  °· Your dizziness or light-headedness gets worse. °· You feel nauseous or vomit. °· You have problems talking, walking, or using your arms, hands, or legs. °· You feel weak. °· You are not thinking clearly or you have trouble forming sentences. It may take a friend or family member to notice this. °· You have chest pain, abdominal pain, shortness of breath, or sweating. °· Your vision changes. °· You notice   any bleeding.  You have side effects from medicine that seems to be getting worse rather than better. MAKE SURE YOU:   Understand these instructions.  Will watch your condition.  Will get help right away if you are not doing well or get worse. Document Released: 02/09/2001 Document Revised: 08/21/2013 Document Reviewed: 03/05/2011 [facility]  Patient Information 2015 [place], [place]. This information is not intended to replace advice given to you by your health care provider. Make sure you discuss any questions you have with your health care provider.   Dehydration, Adult Dehydration is when you lose more fluids from the body than you take in. Vital organs like the kidneys, brain, and heart cannot function without a proper amount of fluids and salt. Any loss of fluids from the body can cause dehydration.  CAUSES   Vomiting.  Diarrhea.  Excessive sweating.  Excessive urine output.  Fever. SYMPTOMS  Mild dehydration  Thirst.  Dry lips.  Slightly dry mouth. Moderate dehydration  Very dry mouth.  Sunken eyes.  Skin does not bounce back quickly when lightly pinched and released.  Dark urine and decreased urine production.  Decreased tear production.  Headache. Severe dehydration  Very dry mouth.  Extreme thirst.  Rapid, weak pulse (more than 100 beats per minute at rest).  Cold hands and feet.  Not able to sweat in spite of heat and temperature.  Rapid breathing.  Blue lips.  Confusion and lethargy.  Difficulty being awakened.  Minimal urine production.  No tears. DIAGNOSIS  Your caregiver will diagnose dehydration based on your symptoms and your exam. Blood and urine tests will help confirm the diagnosis. The diagnostic evaluation should also identify the cause of dehydration. TREATMENT  Treatment of mild or moderate dehydration can often be done at home by increasing the amount of fluids that you drink. It is best to drink small amounts of fluid more often. Drinking too much at one time can make vomiting worse. Refer to the home care instructions below. Severe dehydration needs to be treated at the hospital where you will probably be given intravenous (IV) fluids that contain water and electrolytes. HOME CARE INSTRUCTIONS   Ask your caregiver about specific rehydration instructions.  Drink  enough fluids to keep your urine clear or pale yellow.  Drink small amounts frequently if you have nausea and vomiting.  Eat as you normally do.  Avoid:  Foods or drinks high in sugar.  Carbonated drinks.  Juice.  Extremely hot or cold fluids.  Drinks with caffeine.  Fatty, greasy foods.  Alcohol.  Tobacco.  Overeating.  Gelatin desserts.  Wash your hands well to avoid spreading bacteria and viruses.  Only take over-the-counter or prescription medicines for pain, discomfort, or fever as directed by your caregiver.  Ask your caregiver if you should continue all prescribed and over-the-counter medicines.  Keep all follow-up appointments with your caregiver. SEEK MEDICAL CARE IF:  You have abdominal pain and it increases or stays in one area (localizes).  You have a rash, stiff neck, or severe headache.  You are irritable, sleepy, or difficult to awaken.  You are weak, dizzy, or extremely thirsty. SEEK IMMEDIATE MEDICAL CARE IF:   You are unable to keep fluids down or you get worse despite treatment.  You have frequent episodes of vomiting or diarrhea.  You have blood or green matter (bile) in your vomit.  You have blood in your stool or your stool looks black and tarry.  You have not urinated in 6 to  8 hours, or you have only urinated a small amount of very dark urine.  You have a fever.  You faint. MAKE SURE YOU:   Understand these instructions.  Will watch your condition.  Will get help right away if you are not doing well or get worse. Document Released: 08/16/2005 Document Revised: 11/08/2011 Document Reviewed: 04/05/2011 [facility] Patient Information 2015 [place], [place]. This information is not intended to replace advice given to you by your health care provider. Make sure you discuss any questions you have with your health care provider.

## 2014-09-09 NOTE — ED Notes (Signed)
Pt alert & oriented x4, stable gait. Patient  given discharge instructions, paperwork & prescription(s).  Patient verbalized understanding. Pt left department in wheelchair w/ no further questions. 

## 2014-09-09 NOTE — ED Notes (Signed)
Pt alert, denies any pain. Says ready to go home. Pt informed waiting on lab work but I will keep him notified & let the EDP know as soon as all results are back.

## 2014-09-09 NOTE — ED Notes (Signed)
Dizzy for 2 hours, occurs when stands up.No NVD,  No HI

## 2014-09-09 NOTE — ED Provider Notes (Signed)
TIME SEEN: 6:45 PM  CHIEF COMPLAINT: Lightheaded  HPI: Pt is a 81 y.o. male with history of coronary artery disease, COPD, AAA, hypothyroidism, hyperlipidemia who presents to the emergency department with complaints of feeling lightheaded for the past several hours. Reports symptoms are only present with standing upright. No vertigo. No chest pain or shortness of breath. No recent vomiting or diarrhea. No bloody stool or melena. Patient's son reports that he has not been drinking well over the past several days. Patient denies numbness, tingly or focal weakness. No history of head injury. No headache.  ROS: See HPI Constitutional: no fever  Eyes: no drainage  ENT: no runny nose   Cardiovascular:  no chest pain  Resp: no SOB  GI: no vomiting GU: no dysuria Integumentary: no rash  Allergy: no hives  Musculoskeletal: no leg swelling  Neurological: no slurred speech ROS otherwise negative  PAST MEDICAL HISTORY/PAST SURGICAL HISTORY:  Past Medical History  Diagnosis Date  . Coronary artery disease     WITH HISTORY OF OCCLUSION OF A DIAGONAL BRANCH  . SBO (small bowel obstruction)   . Carotid occlusion, right   . COPD (chronic obstructive pulmonary disease)   . Gastritis   . AAA (abdominal aortic aneurysm)   . Hiatal hernia   . Weight loss   . Dysphagia   . Hypothyroidism   . Cancer     stomach cancer    MEDICATIONS:  Prior to Admission medications   Medication Sig Start Date End Date Taking? Authorizing Provider  acetaminophen (TYLENOL) 500 MG tablet Take 1,000 mg by mouth 2 (two) times daily. For pain   Yes Historical Provider, MD  Fluticasone-Salmeterol (ADVAIR DISKUS) 250-50 MCG/DOSE AEPB Inhale 1 puff into the lungs every 12 (twelve) hours.     Yes Historical Provider, MD  levothyroxine (SYNTHROID, LEVOTHROID) 125 MCG tablet Take 125 mcg by mouth daily.     Yes Historical Provider, MD  losartan (COZAAR) 25 MG tablet Take 1 tablet (25 mg total) by mouth daily. 04/25/14  Yes  Arnoldo Lenis, MD  Multiple Vitamin (MULTIVITAMIN) tablet Take 1 tablet by mouth every evening.    Yes Historical Provider, MD  pantoprazole (PROTONIX) 40 MG tablet TAKE 1 TABLET BY MOUTH EVERY DAY 02/11/14  Yes Rogene Houston, MD  Probiotic Product (ALIGN) 4 MG CAPS Take 4 mg by mouth daily.    Yes Historical Provider, MD  theophylline (THEOPHYLLINE) 300 MG 12 hr tablet Take 300 mg by mouth 2 (two) times daily.     Yes Historical Provider, MD  clopidogrel (PLAVIX) 75 MG tablet Take 1 tablet (75 mg total) by mouth daily. Patient not taking: Reported on 09/09/2014 07/11/14   Arnoldo Lenis, MD  predniSONE (DELTASONE) 20 MG tablet Take 1 tablet (20 mg total) by mouth daily with breakfast. Patient not taking: Reported on 09/09/2014 07/22/14   Nat Christen, MD    ALLERGIES:  Allergies  Allergen Reactions  . Aspirin Swelling  . Esomeprazole Magnesium Diarrhea  . Niacin Swelling  . Penicillins Swelling    SOCIAL HISTORY:  History  Substance Use Topics  . Smoking status: Former Smoker    Quit date: 08/30/1957  . Smokeless tobacco: Never Used     Comment: Quit smoking in 1959  . Alcohol Use: No    FAMILY HISTORY: Family History  Problem Relation Age of Onset  . Heart disease Father   . Healthy Sister   . Heart disease Sister   . Stroke Sister   .  Stroke Daughter   . Diabetes Daughter   . Healthy Son     EXAM: BP 176/96 mmHg  Pulse 56  Temp(Src) 97.7 F (36.5 C) (Oral)  Resp 11  Ht 6' (1.829 m)  Wt 140 lb (63.504 kg)  BMI 18.98 kg/m2  SpO2 99% CONSTITUTIONAL: Alert and oriented and responds appropriately to questions. Well-appearing; well-nourished HEAD: Normocephalic EYES: Conjunctivae clear, PERRL ENT: normal nose; no rhinorrhea; moist mucous membranes; pharynx without lesions noted NECK: Supple, no meningismus, no LAD  CARD: RRR; S1 and S2 appreciated; no murmurs, no clicks, no rubs, no gallops RESP: Normal chest excursion without splinting or tachypnea; breath  sounds clear and equal bilaterally; no wheezes, no rhonchi, no rales,  ABD/GI: Normal bowel sounds; non-distended; soft, non-tender, no rebound, no guarding BACK:  The back appears normal and is non-tender to palpation, there is no CVA tenderness EXT: Normal ROM in all joints; non-tender to palpation; no edema; normal capillary refill; no cyanosis    SKIN: Normal color for age and race; warm NEURO: Moves all extremities equally, sensation to light touch intact diffusely, cranial nerves II through XII intact, no dysmetria to finger to nose testing, no nystagmus, patient has a normal gait and ambulates with a very minimal assistance PSYCH: The patient's mood and manner are appropriate. Grooming and personal hygiene are appropriate.  MEDICAL DECISION MAKING: Patient here with lightheadedness worse with standing. His heart rate does jump from the 50s to 70s with standing blood pressure is stable. We'll give IV fluids. Suspect dehydration may be a component of his symptoms. He is neurologically intact. We'll obtain labs, urinalysis.  ED PROGRESS: Patient's labs are unremarkable. Urine shows no sign of infection, no ketones. Troponin negative. EKG nonischemic. Patient reports feeling much better after IV fluids. Have advised him to continue drinking plenty of fluids at home. I feel he is safe to be discharged. Patient's son at bedside agrees with plan. Discussed return precautions.     EKG Interpretation  Date/Time:  Monday September 09 2014 18:16:26 EST Ventricular Rate:  57 PR Interval:  213 QRS Duration: 108 QT Interval:  429 QTC Calculation: 418 R Axis:   -31 Text Interpretation:  Sinus rhythm Borderline prolonged PR interval Left ventricular hypertrophy Anterior infarct, old Baseline wander in lead(s) V5 No significant change since last tracing in May 2014 Confirmed by [place],  DO, Trenna Kiely [phone]) on 09/09/2014 6:49:29 PM          [place], DO 09/09/14 2249

## 2014-09-16 DIAGNOSIS — G629 Polyneuropathy, unspecified: Secondary | ICD-10-CM | POA: Diagnosis not present

## 2014-09-16 DIAGNOSIS — I739 Peripheral vascular disease, unspecified: Secondary | ICD-10-CM | POA: Diagnosis not present

## 2014-09-16 DIAGNOSIS — L851 Acquired keratosis [keratoderma] palmaris et plantaris: Secondary | ICD-10-CM | POA: Diagnosis not present

## 2014-09-16 DIAGNOSIS — M79671 Pain in right foot: Secondary | ICD-10-CM | POA: Diagnosis not present

## 2014-09-16 DIAGNOSIS — B351 Tinea unguium: Secondary | ICD-10-CM | POA: Diagnosis not present

## 2014-09-16 DIAGNOSIS — L609 Nail disorder, unspecified: Secondary | ICD-10-CM | POA: Diagnosis not present

## 2014-09-17 ENCOUNTER — Encounter (INDEPENDENT_AMBULATORY_CARE_PROVIDER_SITE_OTHER): Payer: Self-pay | Admitting: Internal Medicine

## 2014-09-17 ENCOUNTER — Ambulatory Visit (INDEPENDENT_AMBULATORY_CARE_PROVIDER_SITE_OTHER): Payer: Medicare Other | Admitting: Internal Medicine

## 2014-09-17 VITALS — BP 110/66 | HR 64 | Temp 97.0°F | Resp 16 | Ht 72.0 in | Wt 138.2 lb

## 2014-09-17 DIAGNOSIS — K2100 Gastro-esophageal reflux disease with esophagitis, without bleeding: Secondary | ICD-10-CM

## 2014-09-17 DIAGNOSIS — Z8719 Personal history of other diseases of the digestive system: Secondary | ICD-10-CM

## 2014-09-17 DIAGNOSIS — Z85028 Personal history of other malignant neoplasm of stomach: Secondary | ICD-10-CM | POA: Diagnosis not present

## 2014-09-17 NOTE — Patient Instructions (Signed)
Call if you have difficulty swallowing or upper abdominal pain

## 2014-09-17 NOTE — Progress Notes (Signed)
Presenting complaint;  Follow-up for GERD dysphagia and weight loss.  Subjective:  Patient is 81 year old Caucasian male who presents for scheduled visit. He was last seen about 6 months ago. He has no complaints. He states his appetite is normal. He eats 3 meals and snacks in between. He is having 2-3 formed stools daily. He has not had any problems with constipation since his last visit. He denies melena or rectal bleeding heartburn or dysphagia. He has gained 3 pounds since his last visit. Prior to that visit he was gradually losing weight. Patient states he had a dizzy spell a discoid was seen in emergency room and workup was negative. He was felt to have mild dehydration. He has not had any more episodes of postural lightheadedness.   Current Medications: Outpatient Encounter Prescriptions as of 09/17/2014  Medication Sig  . acetaminophen (TYLENOL) 500 MG tablet Take 1,000 mg by mouth 2 (two) times daily. For pain  . Fluticasone-Salmeterol (ADVAIR DISKUS) 250-50 MCG/DOSE AEPB Inhale 1 puff into the lungs every 12 (twelve) hours.    Marland Kitchen levothyroxine (SYNTHROID, LEVOTHROID) 125 MCG tablet Take 125 mcg by mouth daily.    Marland Kitchen losartan (COZAAR) 25 MG tablet Take 1 tablet (25 mg total) by mouth daily.  . Multiple Vitamin (MULTIVITAMIN) tablet Take 1 tablet by mouth every evening.   . pantoprazole (PROTONIX) 40 MG tablet TAKE 1 TABLET BY MOUTH EVERY DAY  . Probiotic Product (ALIGN) 4 MG CAPS Take 4 mg by mouth daily.   . theophylline (THEOPHYLLINE) 300 MG 12 hr tablet Take 300 mg by mouth 2 (two) times daily.    . [DISCONTINUED] clopidogrel (PLAVIX) 75 MG tablet Take 1 tablet (75 mg total) by mouth daily. (Patient not taking: Reported on 09/09/2014)  . [DISCONTINUED] predniSONE (DELTASONE) 20 MG tablet Take 1 tablet (20 mg total) by mouth daily with breakfast. (Patient not taking: Reported on 09/09/2014)     Objective: Blood pressure 110/66, pulse 64, temperature 97 F (36.1 C), temperature  source Oral, resp. rate 16, height 6' (1.829 m), weight 138 lb 3.2 oz (62.687 kg). Patient is alert and in no acute distress. Conjunctiva is pink. Sclera is nonicteric Oropharyngeal mucosa is normal. No neck masses or thyromegaly noted. Cardiac exam with regular rhythm normal S1 and S2. No murmur or gallop noted. Lungs are clear to auscultation. Abdomen is symmetrical. On palpation abdomen is soft without organomegaly or masses. There is some irregularity to upper abdominal wall secondary to mesh.  No LE edema or clubbing noted.  Labs/studies Results: Lab data from 09/09/2014  WBC 6.3, H&H 13.3 and 40.4 and platelet count was 203K  Eos 9%.  Assessment:  #1. GERD complicated by distal esophageal stricture. Symptoms well controlled and he is not having any difficulty swallowing. Distal esophageal stricture was last dilated in September 2012 and not an EGD of November 2013. #2. Weight loss. He has gained 3 pounds since his last visit which is reassuring. #3. History of early gastric carcinoma. Status post submucosal resection at [facility] in October 2012. Follow-up EGD in November 2013 was negative. Patient not interested in surveillance EGD.   Plan:  Continue pantoprazole at 40 mg by mouth every morning. Office visit in 1 year. Patient will call if he develops dysphagia or abdominal pain.

## 2014-09-18 DIAGNOSIS — C4442 Squamous cell carcinoma of skin of scalp and neck: Secondary | ICD-10-CM | POA: Diagnosis not present

## 2014-09-18 DIAGNOSIS — C44320 Squamous cell carcinoma of skin of unspecified parts of face: Secondary | ICD-10-CM | POA: Diagnosis not present

## 2014-09-18 DIAGNOSIS — C44221 Squamous cell carcinoma of skin of unspecified ear and external auricular canal: Secondary | ICD-10-CM | POA: Diagnosis not present

## 2014-12-02 DIAGNOSIS — M79671 Pain in right foot: Secondary | ICD-10-CM | POA: Diagnosis not present

## 2014-12-02 DIAGNOSIS — L609 Nail disorder, unspecified: Secondary | ICD-10-CM | POA: Diagnosis not present

## 2014-12-02 DIAGNOSIS — B351 Tinea unguium: Secondary | ICD-10-CM | POA: Diagnosis not present

## 2014-12-02 DIAGNOSIS — I739 Peripheral vascular disease, unspecified: Secondary | ICD-10-CM | POA: Diagnosis not present

## 2014-12-02 DIAGNOSIS — G629 Polyneuropathy, unspecified: Secondary | ICD-10-CM | POA: Diagnosis not present

## 2014-12-02 DIAGNOSIS — L851 Acquired keratosis [keratoderma] palmaris et plantaris: Secondary | ICD-10-CM | POA: Diagnosis not present

## 2014-12-30 DIAGNOSIS — Z85828 Personal history of other malignant neoplasm of skin: Secondary | ICD-10-CM | POA: Diagnosis not present

## 2014-12-30 DIAGNOSIS — L570 Actinic keratosis: Secondary | ICD-10-CM | POA: Diagnosis not present

## 2015-01-01 DIAGNOSIS — I10 Essential (primary) hypertension: Secondary | ICD-10-CM | POA: Diagnosis not present

## 2015-01-01 DIAGNOSIS — E039 Hypothyroidism, unspecified: Secondary | ICD-10-CM | POA: Diagnosis not present

## 2015-01-01 DIAGNOSIS — Z125 Encounter for screening for malignant neoplasm of prostate: Secondary | ICD-10-CM | POA: Diagnosis not present

## 2015-01-06 DIAGNOSIS — E039 Hypothyroidism, unspecified: Secondary | ICD-10-CM | POA: Diagnosis not present

## 2015-01-06 DIAGNOSIS — J449 Chronic obstructive pulmonary disease, unspecified: Secondary | ICD-10-CM | POA: Diagnosis not present

## 2015-01-14 DIAGNOSIS — A881 Epidemic vertigo: Secondary | ICD-10-CM | POA: Diagnosis not present

## 2015-02-10 DIAGNOSIS — B351 Tinea unguium: Secondary | ICD-10-CM | POA: Diagnosis not present

## 2015-02-10 DIAGNOSIS — L851 Acquired keratosis [keratoderma] palmaris et plantaris: Secondary | ICD-10-CM | POA: Diagnosis not present

## 2015-02-10 DIAGNOSIS — I739 Peripheral vascular disease, unspecified: Secondary | ICD-10-CM | POA: Diagnosis not present

## 2015-02-10 DIAGNOSIS — G629 Polyneuropathy, unspecified: Secondary | ICD-10-CM | POA: Diagnosis not present

## 2015-02-10 DIAGNOSIS — L609 Nail disorder, unspecified: Secondary | ICD-10-CM | POA: Diagnosis not present

## 2015-02-10 DIAGNOSIS — M216X9 Other acquired deformities of unspecified foot: Secondary | ICD-10-CM | POA: Diagnosis not present

## 2015-02-19 ENCOUNTER — Other Ambulatory Visit (INDEPENDENT_AMBULATORY_CARE_PROVIDER_SITE_OTHER): Payer: Self-pay | Admitting: Internal Medicine

## 2015-03-10 DIAGNOSIS — R609 Edema, unspecified: Secondary | ICD-10-CM | POA: Diagnosis not present

## 2015-03-11 ENCOUNTER — Ambulatory Visit (INDEPENDENT_AMBULATORY_CARE_PROVIDER_SITE_OTHER): Payer: Medicare Other | Admitting: Orthopedic Surgery

## 2015-03-11 VITALS — BP 131/57 | Ht 72.0 in | Wt 133.0 lb

## 2015-03-11 DIAGNOSIS — M129 Arthropathy, unspecified: Secondary | ICD-10-CM

## 2015-03-11 DIAGNOSIS — M1710 Unilateral primary osteoarthritis, unspecified knee: Secondary | ICD-10-CM

## 2015-03-11 NOTE — Patient Instructions (Signed)
Joint Injection  Care After  Refer to this sheet in the next few days. These instructions provide you with information on caring for yourself after you have had a joint injection. Your caregiver also may give you more specific instructions. Your treatment has been planned according to current medical practices, but problems sometimes occur. Call your caregiver if you have any problems or questions after your procedure.  After any type of joint injection, it is not uncommon to experience:  · Soreness, swelling, or bruising around the injection site.  · Mild numbness, tingling, or weakness around the injection site caused by the numbing medicine used before or with the injection.  It also is possible to experience the following effects associated with the specific agent after injection:  · Iodine-based contrast agents:  ¨ Allergic reaction (itching, hives, widespread redness, and swelling beyond the injection site).  · Corticosteroids (These effects are rare.):  ¨ Allergic reaction.  ¨ Increased blood sugar levels (If you have diabetes and you notice that your blood sugar levels have increased, notify your caregiver).  ¨ Increased blood pressure levels.  ¨ Mood swings.  · Hyaluronic acid in the use of viscosupplementation.  ¨ Temporary heat or redness.  ¨ Temporary rash and itching.  ¨ Increased fluid accumulation in the injected joint.  These effects all should resolve within a day after your procedure.   HOME CARE INSTRUCTIONS  · Limit yourself to light activity the day of your procedure. Avoid lifting heavy objects, bending, stooping, or twisting.  · Take prescription or over-the-counter pain medication as directed by your caregiver.  · You may apply ice to your injection site to reduce pain and swelling the day of your procedure. Ice may be applied 03-04 times:  ¨ Put ice in a plastic bag.  ¨ Place a towel between your skin and the bag.  ¨ Leave the ice on for no longer than 15-20 minutes each time.  SEEK  IMMEDIATE MEDICAL CARE IF:   · Pain and swelling get worse rather than better or extend beyond the injection site.  · Numbness does not go away.  · Blood or fluid continues to leak from the injection site.  · You have chest pain.  · You have swelling of your face or tongue.  · You have trouble breathing or you become dizzy.  · You develop a fever, chills, or severe tenderness at the injection site that last longer than 1 day.  MAKE SURE YOU:  · Understand these instructions.  · Watch your condition.  · Get help right away if you are not doing well or if you get worse.  Document Released: 04/29/2011 Document Revised: 11/08/2011 Document Reviewed: 04/29/2011  ExitCare® Patient Information ©2015 [employer]. This information is not intended to replace advice given to you by your health care provider. Make sure you discuss any questions you have with your health care provider.

## 2015-03-11 NOTE — Progress Notes (Signed)
Chief Complaint  Patient presents with  . Knee Problem    bilateral knee swelling    Aspirate inject right knee Procedure note injection and aspiration right knee joint  Verbal consent was obtained to aspirate and inject the right knee joint   Timeout was completed to confirm the site of aspiration and injection  An 18-gauge needle was used to aspirate the knee joint from a suprapatellar lateral approach.  The medications used were 40 mg of Depo-Medrol and 1% lidocaine 3 cc  Anesthesia was provided by ethyl chloride and the skin was prepped with alcohol.  After cleaning the skin with alcohol an 18-gauge needle was used to aspirate the right knee joint.  We obtained 10  cc of fluid  We follow this by injection of 40 mg of Depo-Medrol and 3 cc 1% lidocaine.  There were no complications. A sterile bandage was applied.     Inject left knee  Procedure note left knee injection verbal consent was obtained to inject left knee joint  Timeout was completed to confirm the site of injection  The medications used were 40 mg of Depo-Medrol and 1% lidocaine 3 cc  Anesthesia was provided by ethyl chloride and the skin was prepped with alcohol.  After cleaning the skin with alcohol a 20-gauge needle was used to inject the left knee joint. There were no complications. A sterile bandage was applied.

## 2015-03-27 DIAGNOSIS — Z961 Presence of intraocular lens: Secondary | ICD-10-CM | POA: Diagnosis not present

## 2015-04-22 DIAGNOSIS — I739 Peripheral vascular disease, unspecified: Secondary | ICD-10-CM | POA: Diagnosis not present

## 2015-04-22 DIAGNOSIS — G629 Polyneuropathy, unspecified: Secondary | ICD-10-CM | POA: Diagnosis not present

## 2015-04-22 DIAGNOSIS — L851 Acquired keratosis [keratoderma] palmaris et plantaris: Secondary | ICD-10-CM | POA: Diagnosis not present

## 2015-04-22 DIAGNOSIS — B351 Tinea unguium: Secondary | ICD-10-CM | POA: Diagnosis not present

## 2015-04-25 ENCOUNTER — Ambulatory Visit (INDEPENDENT_AMBULATORY_CARE_PROVIDER_SITE_OTHER): Payer: Medicare Other | Admitting: Cardiology

## 2015-04-25 ENCOUNTER — Encounter: Payer: Self-pay | Admitting: Cardiology

## 2015-04-25 VITALS — BP 132/62 | HR 55 | Ht 72.0 in | Wt 135.0 lb

## 2015-04-25 DIAGNOSIS — I2510 Atherosclerotic heart disease of native coronary artery without angina pectoris: Secondary | ICD-10-CM

## 2015-04-25 DIAGNOSIS — I6523 Occlusion and stenosis of bilateral carotid arteries: Secondary | ICD-10-CM

## 2015-04-25 DIAGNOSIS — I10 Essential (primary) hypertension: Secondary | ICD-10-CM | POA: Diagnosis not present

## 2015-04-25 MED ORDER — M-4 KNEE HIGH STOCKINGS MISC: 1.0000 | Status: DC

## 2015-04-25 NOTE — Patient Instructions (Signed)
Your physician recommends that you schedule a follow-up appointment in: 6 weeks with Dr Harl Bowie  STOP Losartan   Please get compression stockings as prescribed     Thank you for choosing [facility] !

## 2015-04-25 NOTE — Progress Notes (Signed)
Patient ID: Luis Guerra, male   DOB: 05-Aug-1921, 81 y.o.   MRN: [id number]     Clinical Summary Luis Guerra is a 81 y.o.male seen today for follow up of the following medical problems.   1. CAD  - last cath 2008 with occluded D2, has been medically managed  - he is not on ASA because of allergy  - no chest pain, no SOB or DOE  - compliant with meds  2. Carotid stenosis  - occluded RICA by Doppler 12/2012  - medically managed   3. Bradycardia  - per notes noted to have a 4 second pause during a prior hospitalization 12/2012, was not felt to be a good candidate for a pacemaker at that time.  - TSH 12/2012 2.3  - can have some orthostatic dizziness, occurs daily. Symptoms unchanged since last visit   4. HTN  - compliant with meds - checks at home occasionally, usually 120s/70s   Past Medical History  Diagnosis Date  . Coronary artery disease     WITH HISTORY OF OCCLUSION OF A DIAGONAL Aleecia Tapia  . SBO (small bowel obstruction)   . Carotid occlusion, right   . COPD (chronic obstructive pulmonary disease)   . Gastritis   . AAA (abdominal aortic aneurysm)   . Hiatal hernia   . Weight loss   . Dysphagia   . Hypothyroidism   . Cancer     stomach cancer     Allergies  Allergen Reactions  . Aspirin Swelling  . Esomeprazole Magnesium Diarrhea  . Niacin Swelling  . Penicillins Swelling     Current Outpatient Prescriptions  Medication Sig Dispense Refill  . acetaminophen (TYLENOL) 500 MG tablet Take 1,000 mg by mouth 2 (two) times daily. For pain    . Fluticasone-Salmeterol (ADVAIR DISKUS) 250-50 MCG/DOSE AEPB Inhale 1 puff into the lungs every 12 (twelve) hours.      Marland Kitchen levothyroxine (SYNTHROID, LEVOTHROID) 125 MCG tablet Take 125 mcg by mouth daily.      Marland Kitchen losartan (COZAAR) 25 MG tablet Take 1 tablet (25 mg total) by mouth daily. 90 tablet 4  . Multiple Vitamin (MULTIVITAMIN) tablet Take 1 tablet by mouth every evening.     . pantoprazole (PROTONIX) 40 MG tablet  TAKE 1 TABLET BY MOUTH EVERY DAY 30 tablet 5  . Probiotic Product (ALIGN) 4 MG CAPS Take 4 mg by mouth daily.     . theophylline (THEOPHYLLINE) 300 MG 12 hr tablet Take 300 mg by mouth 2 (two) times daily.       No current facility-administered medications for this visit.     Past Surgical History  Procedure Laterality Date  . Abdominal aortic aneurysm repair    . Cardiovascular stress test  05/03/2006    EF 60%  . Upper gastrointestinal endoscopy  07/13/06  . Upper gastrointestinal endoscopy  02/19/05  . Upper gastrointestinal endoscopy  07/22/2010    EGD ED  . Colonoscopy  07/22/2010  . Hernia repair    . Esophagogastroduodenoscopy  05/26/2011    Procedure: ESOPHAGOGASTRODUODENOSCOPY (EGD);  Surgeon: Rogene Houston, MD;  Location: [facility];  Service: Endoscopy;  Laterality: N/A;  3:00  . Balloon dilation  05/26/2011    Procedure: BALLOON DILATION;  Surgeon: Rogene Houston, MD;  Location: [facility];  Service: Endoscopy;  Laterality: N/A;  . Esophagogastroduodenoscopy  07/21/2012    Procedure: ESOPHAGOGASTRODUODENOSCOPY (EGD);  Surgeon: Rogene Houston, MD;  Location: [facility];  Service: Endoscopy;  Laterality: N/A;  325-changed to 225 Ann to notify pt  . Cholecystectomy    . Low back surgery    . Cardiac catheterization  06/08/2007    EF 55%  . Back surgery       Allergies  Allergen Reactions  . Aspirin Swelling  . Esomeprazole Magnesium Diarrhea  . Niacin Swelling  . Penicillins Swelling      Family History  Problem Relation Age of Onset  . Heart disease Father   . Healthy Sister   . Heart disease Sister   . Stroke Sister   . Stroke Daughter   . Diabetes Daughter   . Healthy Son      Social History Luis Guerra reports that he quit smoking about 57 years ago. He has never used smokeless tobacco. Luis Guerra reports that he does not drink alcohol.   Review of Systems CONSTITUTIONAL: No weight loss, fever, chills, weakness or fatigue.  HEENT:  Eyes: No visual loss, blurred vision, double vision or yellow sclerae.No hearing loss, sneezing, congestion, runny nose or sore throat.  SKIN: No rash or itching.  CARDIOVASCULAR: per hpi RESPIRATORY: No shortness of breath, cough or sputum.  GASTROINTESTINAL: No anorexia, nausea, vomiting or diarrhea. No abdominal pain or blood.  GENITOURINARY: No burning on urination, no polyuria NEUROLOGICAL: No headache, dizziness, syncope, paralysis, ataxia, numbness or tingling in the extremities. No change in bowel or bladder control.  MUSCULOSKELETAL: No muscle, back pain, joint pain or stiffness.  LYMPHATICS: No enlarged nodes. No history of splenectomy.  PSYCHIATRIC: No history of depression or anxiety.  ENDOCRINOLOGIC: No reports of sweating, cold or heat intolerance. No polyuria or polydipsia.  Marland Kitchen   Physical Examination Filed Vitals:   04/25/15 1132  BP: 132/62  Pulse: 55   Filed Vitals:   04/25/15 1132  Height: 6' (1.829 m)  Weight: 135 lb (61.236 kg)    Gen: resting comfortably, no acute distress HEENT: no scleral icterus, pupils equal round and reactive, no palptable cervical adenopathy,  CV: RRR, no m/r.g, no jvd Resp: Clear to auscultation bilaterally GI: abdomen is soft, non-tender, non-distended, normal bowel sounds, no hepatosplenomegaly MSK: extremities are warm, 1+ bilateral edema Skin: warm, no rash Neuro:  no focal deficits Psych: appropriate affect   Diagnostic Studies 12/2012 Echo  LVEF 55-60%, mild MR, mod LVH,      Assessment and Plan  1. CAD  - no current symptoms  - continue secondary prevention and risk factor modification   2. Carotid stenosis  - known occluded right ICA from prior studies  - no current neuro symptoms, continue medical therapy   3. Bradycardia  - occasional orthostatic dizziness only, continue to follow   4. HTN  - at goal, with orthostatic symptoms will stop losartan  5. LE edema - will prescribe compression  stockings, dry to avoid diuretics due to orthostatic symptoms   F/u 6 weeks   Arnoldo Lenis, M.D

## 2015-04-28 ENCOUNTER — Telehealth: Payer: Self-pay | Admitting: Cardiology

## 2015-04-28 NOTE — Telephone Encounter (Signed)
They need to know more about RX for compression hose

## 2015-04-29 NOTE — Telephone Encounter (Signed)
[facility] would like to know what compression you would like 8/15, 15/20 is mild, 20/30 is moderate, and 30/40 is high.  Please advise.

## 2015-04-30 NOTE — Telephone Encounter (Signed)
Spoke with Sharee Pimple at [facility]. Dr. Nelly Laurence notes given. Sharee Pimple voiced understanding.

## 2015-04-30 NOTE — Telephone Encounter (Signed)
15/20 please  Luis Abts MD

## 2015-05-01 ENCOUNTER — Telehealth: Payer: Self-pay | Admitting: Cardiology

## 2015-05-01 NOTE — Telephone Encounter (Signed)
pls call Sharee Pimple at [facility] [phone] concerning Mr. Taylors compression stockings

## 2015-05-01 NOTE — Telephone Encounter (Signed)
Pt cannot wear compression stockings that are medium in strength because his ankles are too wide which makes stocking fall down calf.She Sharee Pimple at [facility]) can put in in a non rx stocking with less support but it is better than nothing.do you concur?

## 2015-05-02 NOTE — Telephone Encounter (Signed)
Relayed message to [place] at [facility]

## 2015-05-02 NOTE — Telephone Encounter (Signed)
I concur

## 2015-05-07 DIAGNOSIS — Z85828 Personal history of other malignant neoplasm of skin: Secondary | ICD-10-CM | POA: Diagnosis not present

## 2015-05-07 DIAGNOSIS — L570 Actinic keratosis: Secondary | ICD-10-CM | POA: Diagnosis not present

## 2015-05-08 DIAGNOSIS — E039 Hypothyroidism, unspecified: Secondary | ICD-10-CM | POA: Diagnosis not present

## 2015-05-08 DIAGNOSIS — I10 Essential (primary) hypertension: Secondary | ICD-10-CM | POA: Diagnosis not present

## 2015-05-08 DIAGNOSIS — E785 Hyperlipidemia, unspecified: Secondary | ICD-10-CM | POA: Diagnosis not present

## 2015-05-13 DIAGNOSIS — D649 Anemia, unspecified: Secondary | ICD-10-CM | POA: Diagnosis not present

## 2015-05-13 DIAGNOSIS — E039 Hypothyroidism, unspecified: Secondary | ICD-10-CM | POA: Diagnosis not present

## 2015-06-04 ENCOUNTER — Emergency Department (HOSPITAL_COMMUNITY): Payer: Medicare Other

## 2015-06-04 ENCOUNTER — Emergency Department (HOSPITAL_COMMUNITY)
Admission: EM | Admit: 2015-06-04 | Discharge: 2015-06-04 | Disposition: A | Discharge status: Home / Self Care | Payer: Medicare Other | Attending: Emergency Medicine | Admitting: Emergency Medicine

## 2015-06-04 ENCOUNTER — Encounter (HOSPITAL_COMMUNITY): Payer: Self-pay | Admitting: Emergency Medicine

## 2015-06-04 DIAGNOSIS — I2510 Atherosclerotic heart disease of native coronary artery without angina pectoris: Secondary | ICD-10-CM | POA: Diagnosis not present

## 2015-06-04 DIAGNOSIS — Z9889 Other specified postprocedural states: Secondary | ICD-10-CM | POA: Insufficient documentation

## 2015-06-04 DIAGNOSIS — Z8719 Personal history of other diseases of the digestive system: Secondary | ICD-10-CM | POA: Diagnosis not present

## 2015-06-04 DIAGNOSIS — Z87891 Personal history of nicotine dependence: Secondary | ICD-10-CM | POA: Diagnosis not present

## 2015-06-04 DIAGNOSIS — Z7902 Long term (current) use of antithrombotics/antiplatelets: Secondary | ICD-10-CM | POA: Diagnosis not present

## 2015-06-04 DIAGNOSIS — R0981 Nasal congestion: Secondary | ICD-10-CM | POA: Diagnosis not present

## 2015-06-04 DIAGNOSIS — J449 Chronic obstructive pulmonary disease, unspecified: Secondary | ICD-10-CM | POA: Diagnosis not present

## 2015-06-04 DIAGNOSIS — E039 Hypothyroidism, unspecified: Secondary | ICD-10-CM | POA: Insufficient documentation

## 2015-06-04 DIAGNOSIS — R42 Dizziness and giddiness: Secondary | ICD-10-CM | POA: Insufficient documentation

## 2015-06-04 DIAGNOSIS — R011 Cardiac murmur, unspecified: Secondary | ICD-10-CM | POA: Diagnosis not present

## 2015-06-04 DIAGNOSIS — Z85028 Personal history of other malignant neoplasm of stomach: Secondary | ICD-10-CM | POA: Insufficient documentation

## 2015-06-04 DIAGNOSIS — Z880 Allergy status to penicillin: Secondary | ICD-10-CM | POA: Insufficient documentation

## 2015-06-04 DIAGNOSIS — Z79899 Other long term (current) drug therapy: Secondary | ICD-10-CM | POA: Insufficient documentation

## 2015-06-04 DIAGNOSIS — R9082 White matter disease, unspecified: Secondary | ICD-10-CM | POA: Insufficient documentation

## 2015-06-04 LAB — BASIC METABOLIC PANEL
ANION GAP: 5 (ref 5–15)
BUN: 25 mg/dL — ABNORMAL HIGH (ref 6–20)
CALCIUM: 8.9 mg/dL (ref 8.9–10.3)
CO2: 26 mmol/L (ref 22–32)
Chloride: 110 mmol/L (ref 101–111)
Creatinine, Ser: 1.04 mg/dL (ref 0.61–1.24)
Glucose, Bld: 98 mg/dL (ref 65–99)
Potassium: 5 mmol/L (ref 3.5–5.1)
SODIUM: 141 mmol/L (ref 135–145)

## 2015-06-04 LAB — CBC WITH DIFFERENTIAL/PLATELET
BASOS ABS: 0.1 10*3/uL (ref 0.0–0.1)
BASOS PCT: 1 %
EOS ABS: 0.4 10*3/uL (ref 0.0–0.7)
EOS PCT: 6 %
HCT: 38.8 % — ABNORMAL LOW (ref 39.0–52.0)
Hemoglobin: 12.5 g/dL — ABNORMAL LOW (ref 13.0–17.0)
Lymphocytes Relative: 16 %
Lymphs Abs: 1.1 10*3/uL (ref 0.7–4.0)
MCH: 30 pg (ref 26.0–34.0)
MCHC: 32.2 g/dL (ref 30.0–36.0)
MCV: 93 fL (ref 78.0–100.0)
MONO ABS: 0.8 10*3/uL (ref 0.1–1.0)
Monocytes Relative: 12 %
Neutro Abs: 4.5 10*3/uL (ref 1.7–7.7)
Neutrophils Relative %: 65 %
PLATELETS: 185 10*3/uL (ref 150–400)
RBC: 4.17 MIL/uL — ABNORMAL LOW (ref 4.22–5.81)
RDW: 14.6 % (ref 11.5–15.5)
WBC: 6.9 10*3/uL (ref 4.0–10.5)

## 2015-06-04 MED ORDER — POLYVINYL ALCOHOL 1.4 % OP SOLN
2.0000 [drp] | Freq: Once | OPHTHALMIC | Status: AC
  Administered 2015-06-04: 2 [drp] via OPHTHALMIC
  Filled 2015-06-04: qty 15

## 2015-06-04 MED ORDER — HYPROMELLOSE (GONIOSCOPIC) 2.5 % OP SOLN
2.0000 [drp] | Freq: Once | OPHTHALMIC | Status: DC
  Filled 2015-06-04: qty 15

## 2015-06-04 NOTE — ED Provider Notes (Signed)
CSN: [id number]     Arrival date & time 06/04/15  0909 History   First MD Initiated Contact with Patient 06/04/15 [phone]     Chief Complaint  Patient presents with  . Nasal Congestion     (Consider location/radiation/quality/duration/timing/severity/associated sxs/prior Treatment) HPI Comments: Patient is a 81 year old male who presents to the emergency department with a complaint of congestion, dizziness, and drainage from the eye.  The patient states that he has had drainage from the eye and pressure in the 4 head area for about 3 months. He states this usually causes him to have some sensation of dizziness as though he is off balance and falling. He states that the drainage from his eye is frequently greenish in color and usually a small amount. He is not had any injury to the eye. Has not had any recent operations or procedures. It is of note that he has hearing loss, and requires hearing aids in each ear. He has not had any recent fever or chills. He has not had any blood in any mucus from the nose. He denies any injury to the head or any recent falls. He denies any significant headache.  The history is provided by the patient and a relative.    Past Medical History  Diagnosis Date  . Coronary artery disease     WITH HISTORY OF OCCLUSION OF A DIAGONAL BRANCH  . SBO (small bowel obstruction) ([place])   . Carotid occlusion, right   . COPD (chronic obstructive pulmonary disease) ([place])   . Gastritis   . AAA (abdominal aortic aneurysm) ([place])   . Hiatal hernia   . Weight loss   . Dysphagia   . Hypothyroidism   . Cancer [facility])     stomach cancer   Past Surgical History  Procedure Laterality Date  . Abdominal aortic aneurysm repair    . Cardiovascular stress test  05/03/2006    EF 60%  . Upper gastrointestinal endoscopy  07/13/06  . Upper gastrointestinal endoscopy  02/19/05  . Upper gastrointestinal endoscopy  07/22/2010    EGD ED  . Colonoscopy  07/22/2010  . Hernia repair    .  Esophagogastroduodenoscopy  05/26/2011    Procedure: ESOPHAGOGASTRODUODENOSCOPY (EGD);  Surgeon: Rogene Houston, MD;  [facility];  Service: Endoscopy;  Laterality: N/A;  3:00  . Balloon dilation  05/26/2011    Procedure: BALLOON DILATION;  Surgeon: Rogene Houston, MD;  [facility];  Service: Endoscopy;  Laterality: N/A;  . Esophagogastroduodenoscopy  07/21/2012    Procedure: ESOPHAGOGASTRODUODENOSCOPY (EGD);  Surgeon: Rogene Houston, MD;  [facility];  Service: Endoscopy;  Laterality: N/A;  325-changed to 225 Ann to notify pt  . Cholecystectomy    . Low back surgery    . Cardiac catheterization  06/08/2007    EF 55%  . Back surgery     Family History  Problem Relation Age of Onset  . Heart disease Father   . Healthy Sister   . Heart disease Sister   . Stroke Sister   . Stroke Daughter   . Diabetes Daughter   . Healthy Son    Social History  Substance Use Topics  . Smoking status: Former Smoker    Quit date: 08/30/1957  . Smokeless tobacco: Never Used     Comment: Quit smoking in 1959  . Alcohol Use: No    Review of Systems  Neurological: Positive for dizziness.  All other systems reviewed and are negative.  Allergies  Aspirin; Esomeprazole magnesium; Niacin; and Penicillins  Home Medications   Prior to Admission medications   Medication Sig Start Date End Date Taking? Authorizing Provider  acetaminophen (TYLENOL) 500 MG tablet Take 1,000 mg by mouth 2 (two) times daily.    Yes Historical Provider, MD  clopidogrel (PLAVIX) 75 MG tablet Take 75 mg by mouth daily.   Yes Historical Provider, MD  Fluticasone-Salmeterol (ADVAIR DISKUS) 250-50 MCG/DOSE AEPB Inhale 1 puff into the lungs every 12 (twelve) hours.     Yes Historical Provider, MD  levothyroxine (SYNTHROID, LEVOTHROID) 125 MCG tablet Take 125 mcg by mouth daily.     Yes Historical Provider, MD  losartan (COZAAR) 25 MG tablet Take 25 mg by mouth daily.   Yes Historical  Provider, MD  Multiple Vitamin (MULTIVITAMIN) tablet Take 1 tablet by mouth every evening.    Yes Historical Provider, MD  pantoprazole (PROTONIX) 40 MG tablet TAKE 1 TABLET BY MOUTH EVERY DAY 02/19/15  Yes Rogene Houston, MD  Probiotic Product (ALIGN) 4 MG CAPS Take 4 mg by mouth daily.    Yes Historical Provider, MD  theophylline (THEOPHYLLINE) 300 MG 12 hr tablet Take 300 mg by mouth 2 (two) times daily.     Yes Historical Provider, MD  Elastic Bandages & Supports (M-4 KNEE HIGH STOCKINGS) MISC 1 each by Does not apply route as directed. Medium compression stockings Dx: leg edema 04/25/15   Arnoldo Lenis, MD   BP 173/77 mmHg  Pulse 80  Temp(Src) 98 F (36.7 C) (Oral)  Resp 14  Ht 6' (1.829 m)  Wt 140 lb (63.504 kg)  BMI 18.98 kg/m2  SpO2 100% Physical Exam  Constitutional: He is oriented to person, place, and time. He appears well-developed and well-nourished.  Non-toxic appearance.  HENT:  Head: Normocephalic.  Right Ear: Tympanic membrane and external ear normal.  Left Ear: Tympanic membrane and external ear normal.  Hearing aids present in both ears. Mild wax buildup, but the portion of the tympanic membrane seen is not bulging and shows no sign of infection.  Eyes: EOM and lids are normal. Pupils are equal, round, and reactive to light.  The conjunctiva is clear. There is no drainage or discharge from the eye at this time.  Neck: Normal range of motion. Neck supple. Carotid bruit is not present.  Cardiovascular: Normal rate, regular rhythm, normal heart sounds, intact distal pulses and normal pulses.   2/6 systolic murmur present  Pulmonary/Chest: Breath sounds normal. No respiratory distress.  Abdominal: Soft. Bowel sounds are normal. There is no tenderness. There is no guarding.  Musculoskeletal: Normal range of motion. He exhibits no edema or tenderness.  Lymphadenopathy:       Head (right side): No submandibular adenopathy present.       Head (left side): No  submandibular adenopathy present.    He has no cervical adenopathy.  Neurological: He is alert and oriented to person, place, and time. He has normal strength. No cranial nerve deficit or sensory deficit. He exhibits normal muscle tone. Coordination normal.  Skin: Skin is warm and dry.  Psychiatric: He has a normal mood and affect. His speech is normal.  Nursing note and vitals reviewed.   ED Course  Procedures (including critical care time) Labs Review Labs Reviewed  CBC WITH DIFFERENTIAL/PLATELET - Abnormal; Notable for the following:    RBC 4.17 (*)    Hemoglobin 12.5 (*)    HCT 38.8 (*)    All other components within normal limits  BASIC METABOLIC PANEL - Abnormal; Notable for the following:    BUN 25 (*)    All other components within normal limits    Imaging Review Ct Head Wo Contrast  06/04/2015   CLINICAL DATA:  Dizziness.  EXAM: CT HEAD WITHOUT CONTRAST  TECHNIQUE: Contiguous axial images were obtained from the base of the skull through the vertex without intravenous contrast.  COMPARISON:  MRI 01/15/2013.  PET-CT 01/13/2013  FINDINGS: Mild atrophy. Negative for hydrocephalus. Mild chronic microvascular ischemic change in the white matter.  Negative for acute infarct. Negative for hemorrhage or mass. No edema or shift of the midline structures  Calvarium intact. Paranasal sinuses are clear. Carotid artery calcification.  IMPRESSION: Chronic microvascular ischemic change and mild atrophy. No acute intracranial abnormality.   Electronically Signed   By: Franchot Gallo M.D.   On: 06/04/2015 10:44   I have personally reviewed and evaluated these images and lab results as part of my medical decision-making.   EKG Interpretation None      MDM Vital signs are well within normal limits. Complete blood count shows no acute deficits. Basic metabolic panel is well within normal limits with exception of the BUN being slightly elevated at 25. Normal anion gap. CT scan of the head shows  chronic microvascular ischemic changes involving the white matter. There is no intracranial abnormality.  The family members state that the patient is at his baseline. He has been having this dizziness issue for some time. He wanted to have it checked to make sure that nothing "bad" was happening to him at this time. The patient will follow-up with Dr. Nevada Crane in the office. He will continue his current medications. Given the patient and the family instructions to use caution with changing positions, and to use his cane, and or walker when up and about.    Final diagnoses:  White matter disease  Dizziness    *I have reviewed nursing notes, vital signs, and all appropriate lab and imaging results for this patient.[address], PA-C 06/04/15 1304  Milton Ferguson, MD 06/05/15 [phone]

## 2015-06-04 NOTE — ED Notes (Signed)
Called pharmacy for ophthalmic medication.

## 2015-06-04 NOTE — ED Notes (Signed)
Patient complaining of nasal and eye drainage with pressure to his forehead x 3 months. States drainage from bilateral eyes has been green in color.

## 2015-06-04 NOTE — Discharge Instructions (Signed)
I have reviewed your vital signs. No acute problems found. I've also evaluated your complete blood count and your basic metabolic panel chemistries. No acute changes on either these tests. Your CT scan shows some hardening of arteries of the white matter in the brain. This is possibly contributing to some of your dizziness and sensation of being off balance. Please discuss this with Dr. Nevada Crane.

## 2015-06-06 DIAGNOSIS — Z23 Encounter for immunization: Secondary | ICD-10-CM | POA: Diagnosis not present

## 2015-06-10 ENCOUNTER — Ambulatory Visit (INDEPENDENT_AMBULATORY_CARE_PROVIDER_SITE_OTHER): Payer: Medicare Other | Admitting: Cardiology

## 2015-06-10 ENCOUNTER — Encounter: Payer: Self-pay | Admitting: Cardiology

## 2015-06-10 VITALS — BP 128/70 | HR 58 | Ht 72.0 in | Wt 137.0 lb

## 2015-06-10 DIAGNOSIS — R001 Bradycardia, unspecified: Secondary | ICD-10-CM | POA: Diagnosis not present

## 2015-06-10 DIAGNOSIS — I6523 Occlusion and stenosis of bilateral carotid arteries: Secondary | ICD-10-CM

## 2015-06-10 DIAGNOSIS — I2510 Atherosclerotic heart disease of native coronary artery without angina pectoris: Secondary | ICD-10-CM | POA: Diagnosis not present

## 2015-06-10 DIAGNOSIS — R42 Dizziness and giddiness: Secondary | ICD-10-CM | POA: Diagnosis not present

## 2015-06-10 MED ORDER — LOSARTAN POTASSIUM 25 MG PO TABS: 12.5000 mg | ORAL_TABLET | Freq: Every day | ORAL | Status: DC

## 2015-06-10 NOTE — Patient Instructions (Signed)
Medication Instructions:  Your physician has recommended you make the following change in your medication: 1) DECREASE Losartan to 12.5 mg daily  Labwork: None ordered  Testing/Procedures: None ordered  Follow-Up: Your physician wants you to follow-up in: 6 months with Dr. Harl Bowie. You will receive a reminder letter in the mail two months in advance. If you don't receive a letter, please call our office to schedule the follow-up appointment.  Any Other Special Instructions Will Be Listed Below (If Applicable). Thank you for choosing [facility]!!

## 2015-06-10 NOTE — Progress Notes (Signed)
Patient ID: Luis Guerra, male   DOB: Oct 18, 1920, 81 y.o.   MRN: [id number]     Clinical Summary Luis Guerra is a 81 y.o.male seen today for follow up of the following medical problems.  1. CAD  - last cath 2008 with occluded D2, has been medically managed  - he is not on ASA because of allergy. Compliant with plavix for seconary prevetntion  - no chest pain, no SOB or DOE. Lives alone, still independent.  - compliant with meds  2. Carotid stenosis  - occluded RICA by Doppler 12/2012  - medically managed   3. Bradycardia  - per notes noted to have a 4 second pause during a prior hospitalization 12/2012, was not felt to be a good candidate for a pacemaker at that time.  - TSH 12/2012 2.3  - can have some orthostatic dizziness, occurs daily. Symptoms unchanged since last visit.    4. HTN  - compliant with meds   5. LE edema - compliant with compression stocking - avoiding diuretics due to orhtostatic symptoms   Past Medical History  Diagnosis Date  . Coronary artery disease     WITH HISTORY OF OCCLUSION OF A DIAGONAL Luis Guerra  . SBO (small bowel obstruction) ([place])   . Carotid occlusion, right   . COPD (chronic obstructive pulmonary disease) ([place])   . Gastritis   . AAA (abdominal aortic aneurysm) ([place])   . Hiatal hernia   . Weight loss   . Dysphagia   . Hypothyroidism   . Cancer ([place])     stomach cancer     Allergies  Allergen Reactions  . Aspirin Swelling  . Esomeprazole Magnesium Diarrhea  . Niacin Swelling  . Penicillins Swelling    Has patient had a PCN reaction causing immediate rash, facial/tongue/throat swelling, SOB or lightheadedness with hypotension: YES Has patient had a PCN reaction causing severe rash involving mucus membranes or skin necrosis: NO Has patient had a PCN reaction that required hospitalization: NO Has patient had a PCN reaction occurring within the last 10 years: NO If all of the above answers are "NO", then may proceed with  Cephalosporin use.      Current Outpatient Prescriptions  Medication Sig Dispense Refill  . acetaminophen (TYLENOL) 500 MG tablet Take 1,000 mg by mouth 2 (two) times daily.     . clopidogrel (PLAVIX) 75 MG tablet Take 75 mg by mouth daily.    . Elastic Bandages & Supports (M-4 KNEE HIGH STOCKINGS) MISC 1 each by Does not apply route as directed. Medium compression stockings Dx: leg edema 1 each 0  . Fluticasone-Salmeterol (ADVAIR DISKUS) 250-50 MCG/DOSE AEPB Inhale 1 puff into the lungs every 12 (twelve) hours.      Marland Kitchen levothyroxine (SYNTHROID, LEVOTHROID) 125 MCG tablet Take 125 mcg by mouth daily.      Marland Kitchen losartan (COZAAR) 25 MG tablet Take 25 mg by mouth daily.    . Multiple Vitamin (MULTIVITAMIN) tablet Take 1 tablet by mouth every evening.     . pantoprazole (PROTONIX) 40 MG tablet TAKE 1 TABLET BY MOUTH EVERY DAY 30 tablet 5  . Probiotic Product (ALIGN) 4 MG CAPS Take 4 mg by mouth daily.     . theophylline (THEOPHYLLINE) 300 MG 12 hr tablet Take 300 mg by mouth 2 (two) times daily.       No current facility-administered medications for this visit.     Past Surgical History  Procedure Laterality Date  . Abdominal aortic aneurysm repair    .  Cardiovascular stress test  05/03/2006    EF 60%  . Upper gastrointestinal endoscopy  07/13/06  . Upper gastrointestinal endoscopy  02/19/05  . Upper gastrointestinal endoscopy  07/22/2010    EGD ED  . Colonoscopy  07/22/2010  . Hernia repair    . Esophagogastroduodenoscopy  05/26/2011    Procedure: ESOPHAGOGASTRODUODENOSCOPY (EGD);  Surgeon: Rogene Houston, MD;  [facility];  Service: Endoscopy;  Laterality: N/A;  3:00  . Balloon dilation  05/26/2011    Procedure: BALLOON DILATION;  Surgeon: Rogene Houston, MD;  [facility];  Service: Endoscopy;  Laterality: N/A;  . Esophagogastroduodenoscopy  07/21/2012    Procedure: ESOPHAGOGASTRODUODENOSCOPY (EGD);  Surgeon: Rogene Houston, MD;  [facility];   Service: Endoscopy;  Laterality: N/A;  325-changed to 225 Ann to notify pt  . Cholecystectomy    . Low back surgery    . Cardiac catheterization  06/08/2007    EF 55%  . Back surgery       Allergies  Allergen Reactions  . Aspirin Swelling  . Esomeprazole Magnesium Diarrhea  . Niacin Swelling  . Penicillins Swelling    Has patient had a PCN reaction causing immediate rash, facial/tongue/throat swelling, SOB or lightheadedness with hypotension: YES Has patient had a PCN reaction causing severe rash involving mucus membranes or skin necrosis: NO Has patient had a PCN reaction that required hospitalization: NO Has patient had a PCN reaction occurring within the last 10 years: NO If all of the above answers are "NO", then may proceed with Cephalosporin use.       Family History  Problem Relation Age of Onset  . Heart disease Father   . Healthy Sister   . Heart disease Sister   . Stroke Sister   . Stroke Daughter   . Diabetes Daughter   . Healthy Son      Social History Luis Guerra reports that he quit smoking about 57 years ago. He has never used smokeless tobacco. Luis Guerra reports that he does not drink alcohol.   Review of Systems CONSTITUTIONAL: No weight loss, fever, chills, weakness or fatigue.  HEENT: Eyes: No visual loss, blurred vision, double vision or yellow sclerae.No hearing loss, sneezing, congestion, runny nose or sore throat.  SKIN: No rash or itching.  CARDIOVASCULAR: per hpi RESPIRATORY: No shortness of breath, cough or sputum.  GASTROINTESTINAL: No anorexia, nausea, vomiting or diarrhea. No abdominal pain or blood.  GENITOURINARY: No burning on urination, no polyuria NEUROLOGICAL: No headache, dizziness, syncope, paralysis, ataxia, numbness or tingling in the extremities. No change in bowel or bladder control.  MUSCULOSKELETAL: No muscle, back pain, joint pain or stiffness.  LYMPHATICS: No enlarged nodes. No history of splenectomy.  PSYCHIATRIC: No  history of depression or anxiety.  ENDOCRINOLOGIC: No reports of sweating, cold or heat intolerance. No polyuria or polydipsia.  Marland Kitchen   Physical Examination Filed Vitals:   06/10/15 0948  BP: 128/70  Pulse: 58   Filed Vitals:   06/10/15 0948  Height: 6' (1.829 m)  Weight: 137 lb (62.143 kg)    Gen: resting comfortably, no acute distress HEENT: no scleral icterus, pupils equal round and reactive, no palptable cervical adenopathy,  CV: RRR, no m/r/g, no JVD Resp: Clear to auscultation bilaterally GI: abdomen is soft, non-tender, non-distended, normal bowel sounds, no hepatosplenomegaly MSK: extremities are warm, no edema.  Skin: warm, no rash Neuro:  no focal deficits Psych: appropriate affect   Diagnostic Studies 12/2012 Echo  LVEF 55-60%,  mild MR, mod LVH,      Assessment and Plan   1. CAD  - no current symptoms  - continue secondary prevention and risk factor modification   2. Carotid stenosis  - known occluded right ICA from prior studies  - no current neuro symptoms, continue medical therapy   3. Bradycardia  - occasional orthostatic dizziness only - continue to follow symptoms.   4. HTN  - at goal, with orthostatic symptoms will decrease losartan to 12.5mg  daily  5. LE edema - better with compression stockings. Avoid diuretics due to orthostatic symptoms     Arnoldo Lenis, M.D.

## 2015-07-04 DIAGNOSIS — L851 Acquired keratosis [keratoderma] palmaris et plantaris: Secondary | ICD-10-CM | POA: Diagnosis not present

## 2015-07-04 DIAGNOSIS — G629 Polyneuropathy, unspecified: Secondary | ICD-10-CM | POA: Diagnosis not present

## 2015-07-04 DIAGNOSIS — I739 Peripheral vascular disease, unspecified: Secondary | ICD-10-CM | POA: Diagnosis not present

## 2015-07-04 DIAGNOSIS — B351 Tinea unguium: Secondary | ICD-10-CM | POA: Diagnosis not present

## 2015-07-09 ENCOUNTER — Encounter (INDEPENDENT_AMBULATORY_CARE_PROVIDER_SITE_OTHER): Payer: Self-pay | Admitting: *Deleted

## 2015-07-15 ENCOUNTER — Other Ambulatory Visit: Payer: Self-pay | Admitting: Cardiology

## 2015-07-16 ENCOUNTER — Other Ambulatory Visit: Payer: Self-pay | Admitting: Cardiology

## 2015-07-16 DIAGNOSIS — J040 Acute laryngitis: Secondary | ICD-10-CM | POA: Diagnosis not present

## 2015-08-12 DIAGNOSIS — H6120 Impacted cerumen, unspecified ear: Secondary | ICD-10-CM | POA: Diagnosis not present

## 2015-09-03 DIAGNOSIS — H1033 Unspecified acute conjunctivitis, bilateral: Secondary | ICD-10-CM | POA: Diagnosis not present

## 2015-09-03 DIAGNOSIS — I639 Cerebral infarction, unspecified: Secondary | ICD-10-CM | POA: Diagnosis not present

## 2015-09-03 DIAGNOSIS — R42 Dizziness and giddiness: Secondary | ICD-10-CM | POA: Diagnosis not present

## 2015-09-04 ENCOUNTER — Other Ambulatory Visit (INDEPENDENT_AMBULATORY_CARE_PROVIDER_SITE_OTHER): Payer: Self-pay | Admitting: Internal Medicine

## 2015-09-15 DIAGNOSIS — Z85828 Personal history of other malignant neoplasm of skin: Secondary | ICD-10-CM | POA: Diagnosis not present

## 2015-09-15 DIAGNOSIS — L570 Actinic keratosis: Secondary | ICD-10-CM | POA: Diagnosis not present

## 2015-09-16 ENCOUNTER — Ambulatory Visit (INDEPENDENT_AMBULATORY_CARE_PROVIDER_SITE_OTHER): Payer: Medicare Other | Admitting: Internal Medicine

## 2015-09-16 ENCOUNTER — Encounter (INDEPENDENT_AMBULATORY_CARE_PROVIDER_SITE_OTHER): Payer: Self-pay | Admitting: Internal Medicine

## 2015-09-16 VITALS — BP 134/67 | HR 68 | Temp 98.0°F | Ht 72.0 in | Wt 144.3 lb

## 2015-09-16 DIAGNOSIS — K219 Gastro-esophageal reflux disease without esophagitis: Secondary | ICD-10-CM | POA: Diagnosis not present

## 2015-09-16 DIAGNOSIS — K222 Esophageal obstruction: Secondary | ICD-10-CM | POA: Diagnosis not present

## 2015-09-16 NOTE — Progress Notes (Signed)
Subjective:    Patient ID: Luis Guerra, male    DOB: Dec 20, 1920, 82 y.o.   MRN: [id number]  HPI  Here today for f/u. He was last seen by Dr. Laural Golden in January of 2016. He tells me he is doing very well for his age. He tells me he is still driving. His appetite is good. He has gained 6 pounds since his last visit.  He denies any acid reflux. Acid reflux controlled with Protonix. He denies dysphagia.  He can eat what he wants.  He usually has a BM 2-3 times a day.     07/21/2012 EGD Indications: Patient is 82 year old Caucasian male who presents with recurrent abdominal pain dating posteriorly. He has history of small gastric adenocarcinoma which was resected endoscopically at [facility] one year ago. He had abdominopelvic CT which shows dilated stomach and duodenum more pronounced than previous study of 2009. Is also status post gastro-jejunostomy.  Impression: Markedly dilated with patent gastrojejunostomy with small anastomotic ulcer.  500 mL of bile suctioned out of stomach. Antral scar secondary to previous submucosal resection of gastric adenocarcinoma. No evidence of recurrence. Dilated second part of the duodenum. 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     05/26/2011 EGD with balloon dilation of esophageal stricture.  Indications: Patient is 82 year old Caucasian male with complicated GI history who presents with solid food dysphagia. He has known esophageal stricture which was last dilated in November 2011 to 15 mm with a balloon. He states this intervention helped him until about 4 weeks ago.  Impression: Multiple findings. Distal esophageal stricture was dilated with a balloon to 16.5 mm. Dilated stomach with fluid and food debris;  over 400 ml was suctioned out. Altered anatomy secondary to gastrojejunostomy with small anastomotic ulcer. Antral ulcer sitting on elevated tissue very suspicious for ulcerated leiomyoma. These note this area was biopsied back in 2007 [place] and revealed chronic gastritis. On EGD of November 2011 this lesion appeared to be submucosal lesion possibly leiomyoma. Suspect he also has gastroparesis.  Review of Systems Past Medical History  Diagnosis Date  . Coronary artery disease     WITH HISTORY OF OCCLUSION OF A DIAGONAL BRANCH  . SBO (small bowel obstruction) ([place])   . Carotid occlusion, right   . COPD (chronic obstructive pulmonary disease) ([place])   . Gastritis   . AAA (abdominal aortic aneurysm) ([place])   . Hiatal hernia   . Weight loss   . Dysphagia   . Hypothyroidism   . Cancer [facility])     stomach cancer    Past Surgical History  Procedure Laterality Date  . Abdominal aortic aneurysm repair    . Cardiovascular stress test  05/03/2006    EF 60%  . Upper gastrointestinal endoscopy  07/13/06  . Upper gastrointestinal endoscopy  02/19/05  . Upper gastrointestinal endoscopy  07/22/2010    EGD ED  . Colonoscopy  07/22/2010  . Hernia repair    . Esophagogastroduodenoscopy  05/26/2011    Procedure: ESOPHAGOGASTRODUODENOSCOPY (EGD);  Surgeon: Rogene Houston, MD;  [facility];  Service: Endoscopy;  Laterality: N/A;  3:00  . Balloon dilation  05/26/2011    Procedure: BALLOON DILATION;  Surgeon: Rogene Houston, MD;  [facility];  Service: Endoscopy;  Laterality: N/A;  . Esophagogastroduodenoscopy  07/21/2012    Procedure: ESOPHAGOGASTRODUODENOSCOPY (EGD);  Surgeon: Rogene Houston, MD;  [facility];  Service: Endoscopy;  Laterality: N/A;  325-changed to 225 Ann to notify pt  .  Cholecystectomy    . Low back surgery    . Cardiac catheterization  06/08/2007    EF 55%  . Back surgery      Allergies  Allergen Reactions  . Aspirin Swelling  .  Esomeprazole Magnesium Diarrhea  . Niacin Swelling  . Penicillins Swelling    Has patient had a PCN reaction causing immediate rash, facial/tongue/throat swelling, SOB or lightheadedness with hypotension: YES Has patient had a PCN reaction causing severe rash involving mucus membranes or skin necrosis: NO Has patient had a PCN reaction that required hospitalization: NO Has patient had a PCN reaction occurring within the last 10 years: NO If all of the above answers are "NO", then may proceed with Cephalosporin use.     Current Outpatient Prescriptions on File Prior to Visit  Medication Sig Dispense Refill  . acetaminophen (TYLENOL) 500 MG tablet Take 1,000 mg by mouth 2 (two) times daily.     . clopidogrel (PLAVIX) 75 MG tablet Take 75 mg by mouth daily.    Marland Kitchen levothyroxine (SYNTHROID, LEVOTHROID) 125 MCG tablet Take 125 mcg by mouth daily.      Marland Kitchen losartan (COZAAR) 25 MG tablet Take 0.5 tablets (12.5 mg total) by mouth daily. 15 tablet 6  . Multiple Vitamin (MULTIVITAMIN) tablet Take 1 tablet by mouth every evening.     . pantoprazole (PROTONIX) 40 MG tablet TAKE 1 TABLET BY MOUTH EVERY DAY 30 tablet 5  . Probiotic Product (ALIGN) 4 MG CAPS Take 4 mg by mouth daily.     . theophylline (THEODUR) 300 MG 12 hr tablet TK 1 T PO BID  4  . Fluticasone-Salmeterol (ADVAIR DISKUS) 250-50 MCG/DOSE AEPB Inhale 1 puff into the lungs every 12 (twelve) hours. Reported on 09/16/2015     No current facility-administered medications on file prior to visit.        Objective:   Physical Exam Blood pressure 134/67, pulse 68, temperature 98 F (36.7 C), height 6' (1.829 m), weight 144 lb 4.8 oz (65.454 kg). Alert and oriented. Skin warm and dry. Oral mucosa is moist. Dentures in place  . Sclera anicteric, conjunctivae is pink. Thyroid not enlarged. No cervical lymphadenopathy. Lungs clear. Heart regular rate and rhythm.  Abdomen is soft. Bowel sounds are positive. No hepatomegaly. No abdominal masses felt.  No tenderness.  1+ edema to lower extremities.         Assessment & Plan:  GERD controlled a this time with Protonix. Acid reflux controlled with Protonix. Esophageal stricture. Last dilated in 2012.  He is not having any problems swallowing.  Continue present medications. OV in 1 year.

## 2015-09-16 NOTE — Patient Instructions (Signed)
Continue the Protonix. OV in 1 year.  

## 2015-09-19 DIAGNOSIS — G629 Polyneuropathy, unspecified: Secondary | ICD-10-CM | POA: Diagnosis not present

## 2015-09-19 DIAGNOSIS — B351 Tinea unguium: Secondary | ICD-10-CM | POA: Diagnosis not present

## 2015-09-19 DIAGNOSIS — I739 Peripheral vascular disease, unspecified: Secondary | ICD-10-CM | POA: Diagnosis not present

## 2015-09-19 DIAGNOSIS — L851 Acquired keratosis [keratoderma] palmaris et plantaris: Secondary | ICD-10-CM | POA: Diagnosis not present

## 2015-09-24 DIAGNOSIS — J018 Other acute sinusitis: Secondary | ICD-10-CM | POA: Diagnosis not present

## 2015-09-24 DIAGNOSIS — J309 Allergic rhinitis, unspecified: Secondary | ICD-10-CM | POA: Diagnosis not present

## 2015-10-14 ENCOUNTER — Encounter (HOSPITAL_COMMUNITY): Payer: Self-pay | Admitting: *Deleted

## 2015-10-14 ENCOUNTER — Emergency Department (HOSPITAL_COMMUNITY)
Admission: EM | Admit: 2015-10-14 | Discharge: 2015-10-14 | Disposition: A | Discharge status: Home / Self Care | Payer: Medicare Other | Attending: Emergency Medicine | Admitting: Emergency Medicine

## 2015-10-14 DIAGNOSIS — R42 Dizziness and giddiness: Secondary | ICD-10-CM | POA: Diagnosis not present

## 2015-10-14 DIAGNOSIS — Z880 Allergy status to penicillin: Secondary | ICD-10-CM | POA: Diagnosis not present

## 2015-10-14 DIAGNOSIS — Z85028 Personal history of other malignant neoplasm of stomach: Secondary | ICD-10-CM | POA: Diagnosis not present

## 2015-10-14 DIAGNOSIS — I2510 Atherosclerotic heart disease of native coronary artery without angina pectoris: Secondary | ICD-10-CM | POA: Insufficient documentation

## 2015-10-14 DIAGNOSIS — Z8719 Personal history of other diseases of the digestive system: Secondary | ICD-10-CM | POA: Insufficient documentation

## 2015-10-14 DIAGNOSIS — Z87891 Personal history of nicotine dependence: Secondary | ICD-10-CM | POA: Insufficient documentation

## 2015-10-14 DIAGNOSIS — Z79899 Other long term (current) drug therapy: Secondary | ICD-10-CM | POA: Insufficient documentation

## 2015-10-14 DIAGNOSIS — Z7951 Long term (current) use of inhaled steroids: Secondary | ICD-10-CM | POA: Diagnosis not present

## 2015-10-14 DIAGNOSIS — Z7902 Long term (current) use of antithrombotics/antiplatelets: Secondary | ICD-10-CM | POA: Insufficient documentation

## 2015-10-14 DIAGNOSIS — E039 Hypothyroidism, unspecified: Secondary | ICD-10-CM | POA: Diagnosis not present

## 2015-10-14 DIAGNOSIS — J449 Chronic obstructive pulmonary disease, unspecified: Secondary | ICD-10-CM | POA: Diagnosis not present

## 2015-10-14 LAB — CBC
HCT: 38.1 % — ABNORMAL LOW (ref 39.0–52.0)
Hemoglobin: 12.3 g/dL — ABNORMAL LOW (ref 13.0–17.0)
MCH: 29.7 pg (ref 26.0–34.0)
MCHC: 32.3 g/dL (ref 30.0–36.0)
MCV: 92 fL (ref 78.0–100.0)
PLATELETS: 236 10*3/uL (ref 150–400)
RBC: 4.14 MIL/uL — AB (ref 4.22–5.81)
RDW: 14.3 % (ref 11.5–15.5)
WBC: 5.2 10*3/uL (ref 4.0–10.5)

## 2015-10-14 LAB — BASIC METABOLIC PANEL
ANION GAP: 10 (ref 5–15)
BUN: 27 mg/dL — ABNORMAL HIGH (ref 6–20)
CALCIUM: 9.5 mg/dL (ref 8.9–10.3)
CO2: 25 mmol/L (ref 22–32)
CREATININE: 1.18 mg/dL (ref 0.61–1.24)
Chloride: 109 mmol/L (ref 101–111)
GFR calc Af Amer: 60 mL/min — ABNORMAL LOW (ref >60)
GFR, EST NON AFRICAN AMERICAN: 52 mL/min — AB (ref >60)
Glucose, Bld: 97 mg/dL (ref 65–99)
Potassium: 4.7 mmol/L (ref 3.5–5.1)
Sodium: 144 mmol/L (ref 135–145)

## 2015-10-14 LAB — URINALYSIS, ROUTINE W REFLEX MICROSCOPIC
Bilirubin Urine: NEGATIVE
Glucose, UA: NEGATIVE mg/dL
HGB URINE DIPSTICK: NEGATIVE
Ketones, ur: NEGATIVE mg/dL
Leukocytes, UA: NEGATIVE
Nitrite: NEGATIVE
Protein, ur: NEGATIVE mg/dL
Specific Gravity, Urine: 1.02 (ref 1.005–1.030)
pH: 5.5 (ref 5.0–8.0)

## 2015-10-14 LAB — TROPONIN I: Troponin I: <0.03 ng/mL (ref <0.031)

## 2015-10-14 MED ORDER — MECLIZINE HCL 25 MG PO TABS
25.0000 mg | ORAL_TABLET | Freq: Two times a day (BID) | ORAL | Status: DC | PRN
Start: 2015-10-14 — End: 2017-10-18

## 2015-10-14 MED ORDER — MECLIZINE HCL 12.5 MG PO TABS
25.0000 mg | ORAL_TABLET | Freq: Once | ORAL | Status: AC
  Administered 2015-10-14: 25 mg via ORAL

## 2015-10-14 MED ORDER — MECLIZINE HCL 12.5 MG PO TABS
ORAL_TABLET | ORAL | Status: AC
  Filled 2015-10-14: qty 1

## 2015-10-14 NOTE — ED Notes (Signed)
Pt comes in and states he has had dizziness lasting x1 week. Pt states it has gotten worse today. Pt has hx of vertigo and states this feels the same. Pt has no other deficits in triage, no weakness noted.

## 2015-10-14 NOTE — ED Notes (Signed)
Pt discharged per MD instruction- Ambulated to wheelchair and wheeled out to truck

## 2015-10-14 NOTE — ED Notes (Signed)
Antivert 25 mg given to pt- po. Unable to pull original order from [facility]- pt has received only antivert 25 mg po

## 2015-10-14 NOTE — ED Provider Notes (Signed)
CSN: [id number]     Arrival date & time 10/14/15  1530 History   First MD Initiated Contact with Patient 10/14/15 1749     Chief Complaint  Patient presents with  . Dizziness     (Consider location/radiation/quality/duration/timing/severity/associated sxs/prior Treatment) HPI....Luis KitchenMarland Guerra sense of lightheadedness and dizziness today. Patient has a history of vertigo and his symptoms today feel similarly. No gross neurological deficits, stiff neck, fever, chills, visual changes, headache. He has tried meclizine in the past which has helped. Severity symptoms as moderate.  Past Medical History  Diagnosis Date  . Coronary artery disease     WITH HISTORY OF OCCLUSION OF A DIAGONAL BRANCH  . SBO (small bowel obstruction) ([place])   . Carotid occlusion, right   . COPD (chronic obstructive pulmonary disease) ([place])   . Gastritis   . AAA (abdominal aortic aneurysm) ([place])   . Hiatal hernia   . Weight loss   . Dysphagia   . Hypothyroidism   . Cancer [facility])     stomach cancer   Past Surgical History  Procedure Laterality Date  . Abdominal aortic aneurysm repair    . Cardiovascular stress test  05/03/2006    EF 60%  . Upper gastrointestinal endoscopy  07/13/06  . Upper gastrointestinal endoscopy  02/19/05  . Upper gastrointestinal endoscopy  07/22/2010    EGD ED  . Colonoscopy  07/22/2010  . Hernia repair    . Esophagogastroduodenoscopy  05/26/2011    Procedure: ESOPHAGOGASTRODUODENOSCOPY (EGD);  Surgeon: Rogene Houston, MD;  [facility];  Service: Endoscopy;  Laterality: N/A;  3:00  . Balloon dilation  05/26/2011    Procedure: BALLOON DILATION;  Surgeon: Rogene Houston, MD;  Location[facility];  Service: Endoscopy;  Laterality: N/A;  . Esophagogastroduodenoscopy  07/21/2012    Procedure: ESOPHAGOGASTRODUODENOSCOPY (EGD);  Surgeon: Rogene Houston, MD;  [facility];  Service: Endoscopy;  Laterality: N/A;  325-changed to 225 Ann to notify pt  . Cholecystectomy    . Low  back surgery    . Cardiac catheterization  06/08/2007    EF 55%  . Back surgery     Family History  Problem Relation Age of Onset  . Heart disease Father   . Healthy Sister   . Heart disease Sister   . Stroke Sister   . Stroke Daughter   . Diabetes Daughter   . Healthy Son    Social History  Substance Use Topics  . Smoking status: Former Smoker    Quit date: 08/30/1957  . Smokeless tobacco: Never Used     Comment: Quit smoking in 1959  . Alcohol Use: No    Review of Systems    Allergies  Aspirin; Esomeprazole magnesium; Niacin; and Penicillins  Home Medications   Prior to Admission medications   Medication Sig Start Date End Date Taking? Authorizing Provider  acetaminophen (TYLENOL) 500 MG tablet Take 1,000 mg by mouth 2 (two) times daily.    Yes Historical Provider, MD  clopidogrel (PLAVIX) 75 MG tablet Take 75 mg by mouth daily.   Yes Historical Provider, MD  fexofenadine (ALLEGRA) 180 MG tablet Take 180 mg by mouth daily. 09/04/15  Yes Historical Provider, MD  Fluticasone-Salmeterol (ADVAIR DISKUS) 250-50 MCG/DOSE AEPB Inhale 1 puff into the lungs every 12 (twelve) hours. Reported on 09/16/2015   Yes Historical Provider, MD  levothyroxine (SYNTHROID, LEVOTHROID) 125 MCG tablet Take 125 mcg by mouth daily.     Yes Historical Provider, MD  losartan (COZAAR) 25  MG tablet Take 0.5 tablets (12.5 mg total) by mouth daily. 06/10/15  Yes Arnoldo Lenis, MD  Multiple Vitamin (MULTIVITAMIN) tablet Take 1 tablet by mouth every evening.    Yes Historical Provider, MD  olopatadine (PATANOL) 0.1 % ophthalmic solution Place 1 drop into both eyes daily as needed for allergies.  10/13/15  Yes Historical Provider, MD  pantoprazole (PROTONIX) 40 MG tablet TAKE 1 TABLET BY MOUTH EVERY DAY 09/04/15  Yes Rogene Houston, MD  Probiotic Product (ALIGN) 4 MG CAPS Take 4 mg by mouth daily.    Yes Historical Provider, MD  theophylline (THEODUR) 300 MG 12 hr tablet take one tablet by mouth twice  daily 06/06/15  Yes Historical Provider, MD  meclizine (ANTIVERT) 25 MG tablet Take 1 tablet (25 mg total) by mouth 2 (two) times daily as needed for dizziness. 10/14/15   Nat Christen, MD   BP 182/103 mmHg  Pulse 55  Temp(Src) 97.6 F (36.4 C) (Oral)  Resp 16  Ht 6' (1.829 m)  Wt 145 lb (65.772 kg)  BMI 19.66 kg/m2  SpO2 100% Physical Exam  Constitutional: He is oriented to person, place, and time. He appears well-developed and well-nourished.  HENT:  Head: Normocephalic and atraumatic.  Eyes: Conjunctivae and EOM are normal. Pupils are equal, round, and reactive to light.  Neck: Normal range of motion. Neck supple.  Cardiovascular: Normal rate and regular rhythm.   Pulmonary/Chest: Effort normal and breath sounds normal.  Abdominal: Soft. Bowel sounds are normal.  Musculoskeletal: Normal range of motion.  Neurological: He is alert and oriented to person, place, and time.  Skin: Skin is warm and dry.  Psychiatric: He has a normal mood and affect. His behavior is normal.  Nursing note and vitals reviewed.   ED Course  Procedures (including critical care time) Labs Review Labs Reviewed  BASIC METABOLIC PANEL - Abnormal; Notable for the following:    BUN 27 (*)    GFR calc non Af Amer 52 (*)    GFR calc Af Amer 60 (*)    All other components within normal limits  CBC - Abnormal; Notable for the following:    RBC 4.14 (*)    Hemoglobin 12.3 (*)    HCT 38.1 (*)    All other components within normal limits  URINALYSIS, ROUTINE W REFLEX MICROSCOPIC (NOT AT [facility])  TROPONIN I    Imaging Review No results found. I have personally reviewed and evaluated these images and lab results as part of my medical decision-making.   EKG Interpretation   Date/Time:  Tuesday October 14 2015 15:41:29 EST Ventricular Rate:  59 PR Interval:  204 QRS Duration: 112 QT Interval:  412 QTC Calculation: 407 R Axis:   -47 Text Interpretation:  Sinus bradycardia Left anterior fascicular block   Septal infarct , age undetermined Abnormal ECG Confirmed by Shamal Stracener  MD,  Norina Cowper ([id number]) on 10/14/2015 8:07:14 PM      MDM   Final diagnoses:  Vertigo    Patient has no frank neurological deficits. History and physical c/w vertigo. Labs reassuring. Rx meclizine 25 mg.    Nat Christen, MD 10/14/15 2019

## 2015-10-14 NOTE — Discharge Instructions (Signed)
Benign Positional Vertigo °Vertigo is the feeling that you or your surroundings are moving when they are not. Benign positional vertigo is the most common form of vertigo. The cause of this condition is not serious (is benign). This condition is triggered by certain movements and positions (is positional). This condition can be dangerous if it occurs while you are doing something that could endanger you or others, such as driving.  °CAUSES °In many cases, the cause of this condition is not known. It may be caused by a disturbance in an area of the inner ear that helps your brain to sense movement and balance. This disturbance can be caused by a viral infection (labyrinthitis), head injury, or repetitive motion. °RISK FACTORS °This condition is more likely to develop in: °· Women. °· People who are 50 years of age or older. °SYMPTOMS °Symptoms of this condition usually happen when you move your head or your eyes in different directions. Symptoms may start suddenly, and they usually last for less than a minute. Symptoms may include: °· Loss of balance and falling. °· Feeling like you are spinning or moving. °· Feeling like your surroundings are spinning or moving. °· Nausea and vomiting. °· Blurred vision. °· Dizziness. °· Involuntary eye movement (nystagmus). °Symptoms can be mild and cause only slight annoyance, or they can be severe and interfere with daily life. Episodes of benign positional vertigo may return (recur) over time, and they may be triggered by certain movements. Symptoms may improve over time. °DIAGNOSIS °This condition is usually diagnosed by medical history and a physical exam of the head, neck, and ears. You may be referred to a health care provider who specializes in ear, nose, and throat (ENT) problems (otolaryngologist) or a provider who specializes in disorders of the nervous system (neurologist). You may have additional testing, including: °· MRI. °· A CT scan. °· Eye movement tests. Your  health care provider may ask you to change positions quickly while he or she watches you for symptoms of benign positional vertigo, such as nystagmus. Eye movement may be tested with an electronystagmogram (ENG), caloric stimulation, the Dix-Hallpike test, or the roll test. °· An electroencephalogram (EEG). This records electrical activity in your brain. °· Hearing tests. °TREATMENT °Usually, your health care provider will treat this by moving your head in specific positions to adjust your inner ear back to normal. Surgery may be needed in severe cases, but this is rare. In some cases, benign positional vertigo may resolve on its own in 2-4 weeks. °HOME CARE INSTRUCTIONS °Safety °· Move slowly. Avoid sudden body or head movements. °· Avoid driving. °· Avoid operating heavy machinery. °· Avoid doing any tasks that would be dangerous to you or others if a vertigo episode would occur. °· If you have trouble walking or keeping your balance, try using a cane for stability. If you feel dizzy or unstable, sit down right away. °· Return to your normal activities as told by your health care provider. Ask your health care provider what activities are safe for you. °General Instructions °· Take over-the-counter and prescription medicines only as told by your health care provider. °· Avoid certain positions or movements as told by your health care provider. °· Drink enough fluid to keep your urine clear or pale yellow. °· Keep all follow-up visits as told by your health care provider. This is important. °SEEK MEDICAL CARE IF: °· You have a fever. °· Your condition gets worse or you develop new symptoms. °· Your family or friends   notice any behavioral changes.  Your nausea or vomiting gets worse.  You have numbness or a "pins and needles" sensation. SEEK IMMEDIATE MEDICAL CARE IF:  You have difficulty speaking or moving.  You are always dizzy.  You faint.  You develop severe headaches.  You have weakness in your  legs or arms.  You have changes in your hearing or vision.  You develop a stiff neck.  You develop sensitivity to light.   This information is not intended to replace advice given to you by your health care provider. Make sure you discuss any questions you have with your health care provider.   Document Released: 05/24/2006 Document Revised: 05/07/2015 Document Reviewed: 12/09/2014 Elsevier Interactive Patient Education 2016 [employer].  Medication for vertigo. Rest. Follow-up your primary care doctor. Tests were good.

## 2015-10-23 ENCOUNTER — Other Ambulatory Visit (HOSPITAL_COMMUNITY): Payer: Self-pay | Admitting: Internal Medicine

## 2015-10-23 ENCOUNTER — Ambulatory Visit (HOSPITAL_COMMUNITY)
Admission: RE | Admit: 2015-10-23 | Discharge: 2015-10-23 | Disposition: A | Discharge status: Home / Self Care | Payer: Medicare Other | Source: Ambulatory Visit | Attending: Internal Medicine | Admitting: Internal Medicine

## 2015-10-23 DIAGNOSIS — R0602 Shortness of breath: Secondary | ICD-10-CM | POA: Insufficient documentation

## 2015-10-23 DIAGNOSIS — R918 Other nonspecific abnormal finding of lung field: Secondary | ICD-10-CM | POA: Diagnosis not present

## 2015-10-23 DIAGNOSIS — J849 Interstitial pulmonary disease, unspecified: Secondary | ICD-10-CM | POA: Insufficient documentation

## 2015-10-31 IMAGING — CT CT HEAD W/O CM
1 series · 16 of 30 positions shown, 20 images · non-contrast
Comparison: MRI 01/15/2013.  PET-CT 01/13/2013

CLINICAL DATA: Dizziness.

EXAM:
CT HEAD WITHOUT CONTRAST
TECHNIQUE: Contiguous axial images were obtained from the base of the skull
through the vertex without intravenous contrast.

[Series 2: headtrauma 4.8 h37s · axial · 0.43mm/px · z∈[+95,+240]mm · 16 of 36 slices shown, 20 images]
[im 2/36  brain]
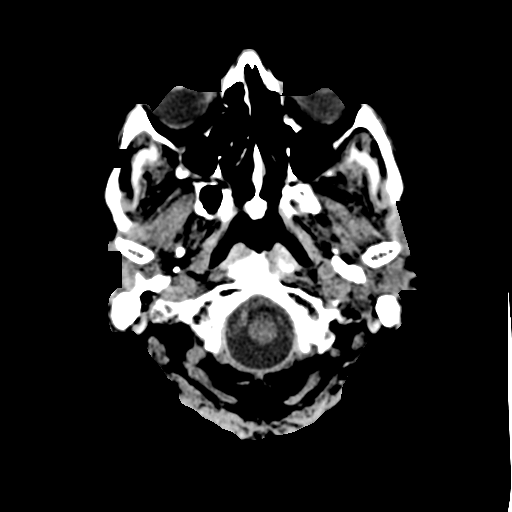
[im 2/36  bone]
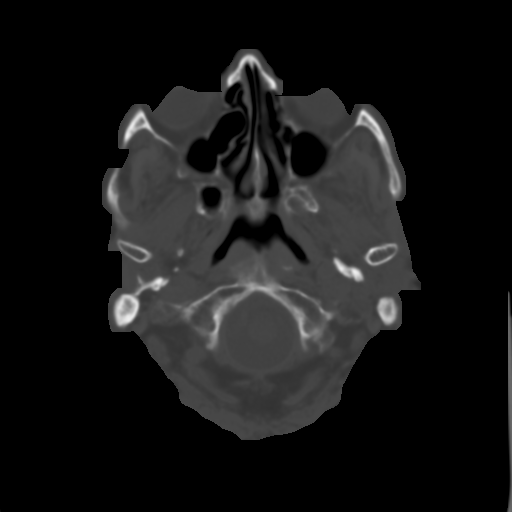
[im 4/36  brain]
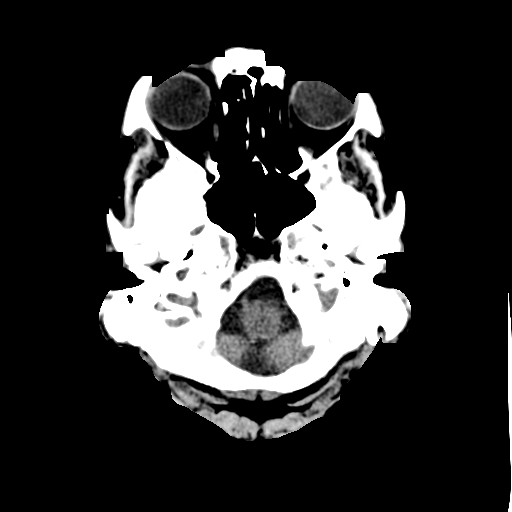
[im 7/36  brain]
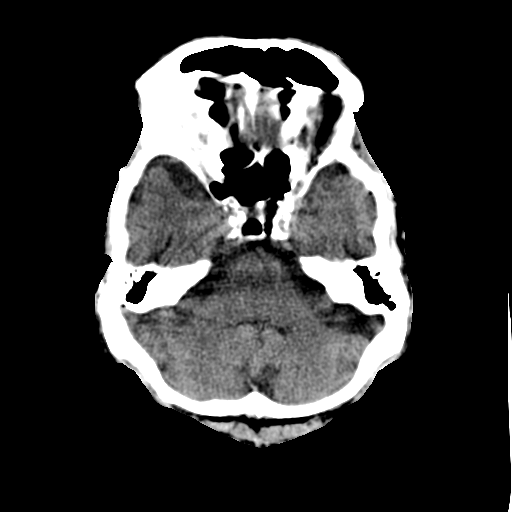
[im 8/36  brain]
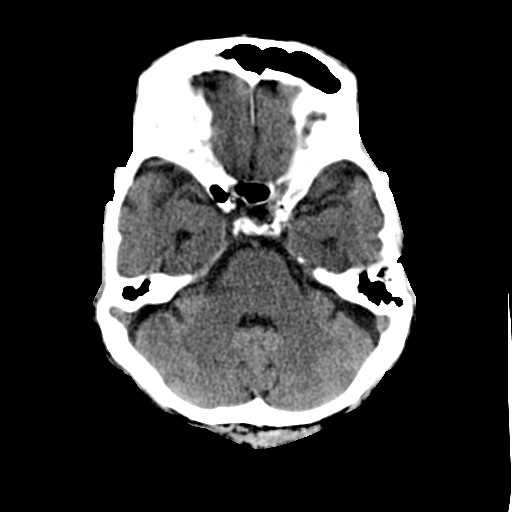
[im 10/36  brain]
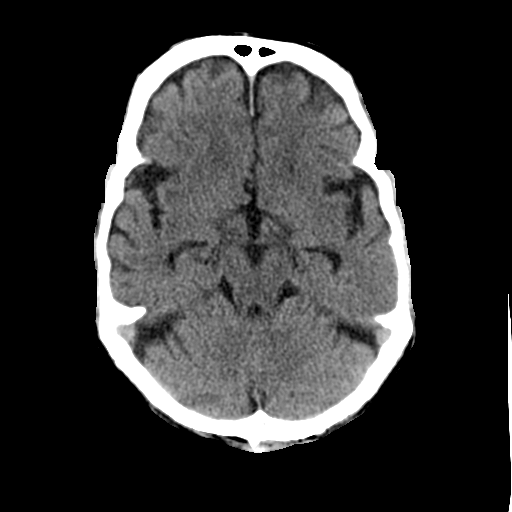
[im 10/36  bone]
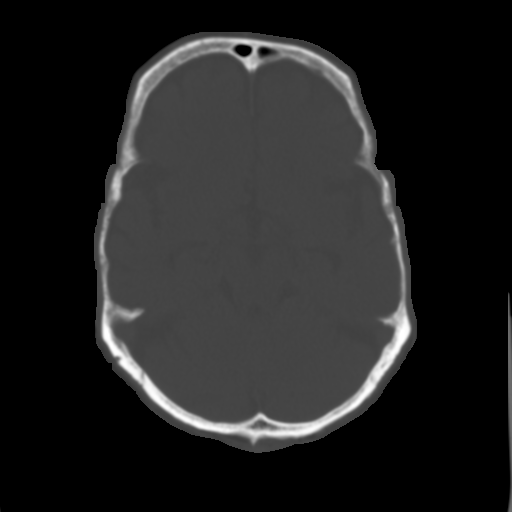
[im 11/36  brain]
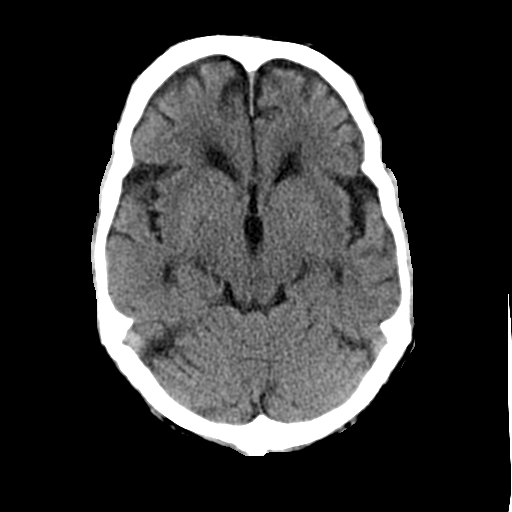
[im 14/36  brain]
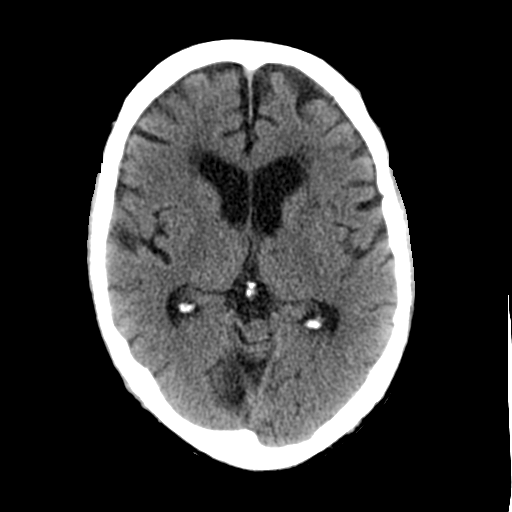
[im 16/36  brain]
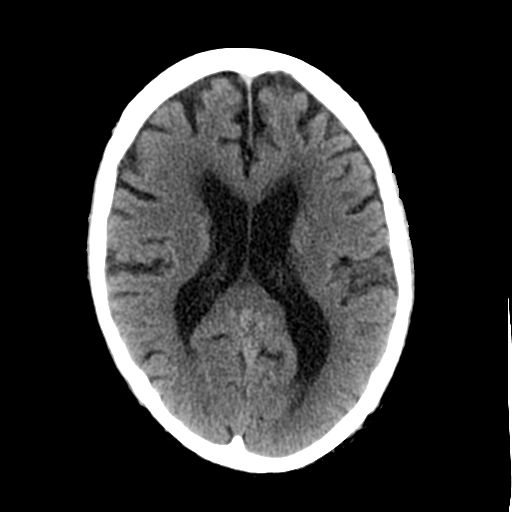
[im 17/36  brain]
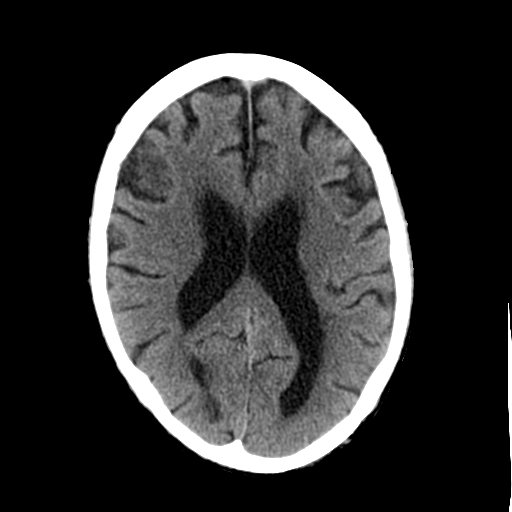
[im 17/36  bone]
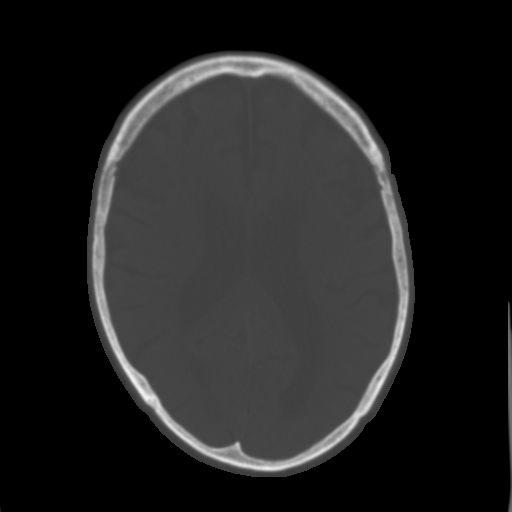
[im 20/36  brain]
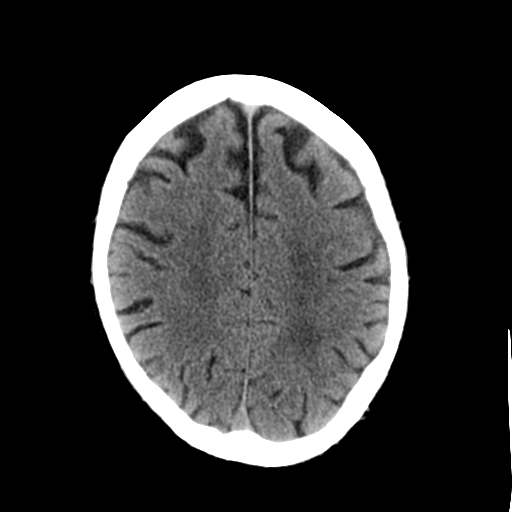
[im 22/36  brain]
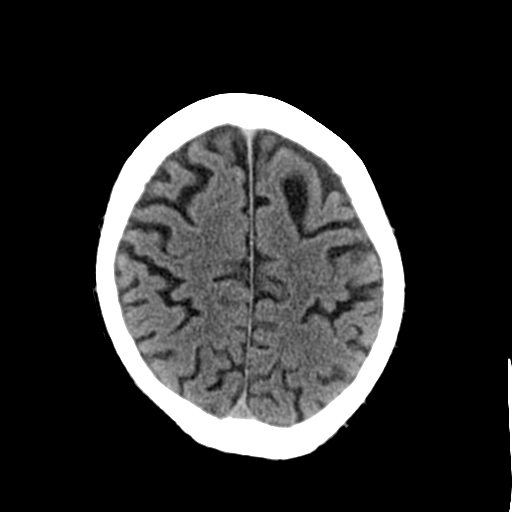
[im 23/36  brain]
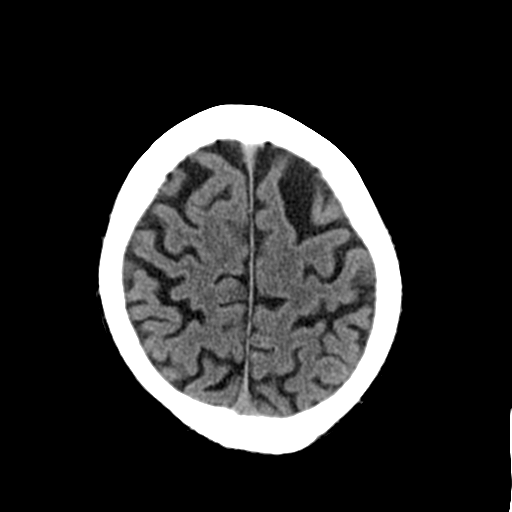
[im 26/36  brain]
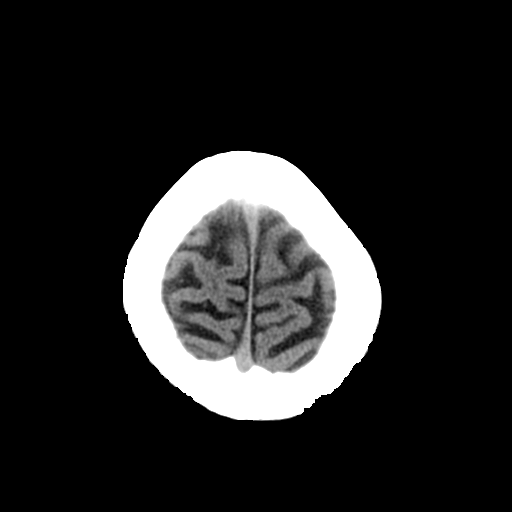
[im 26/36  bone]
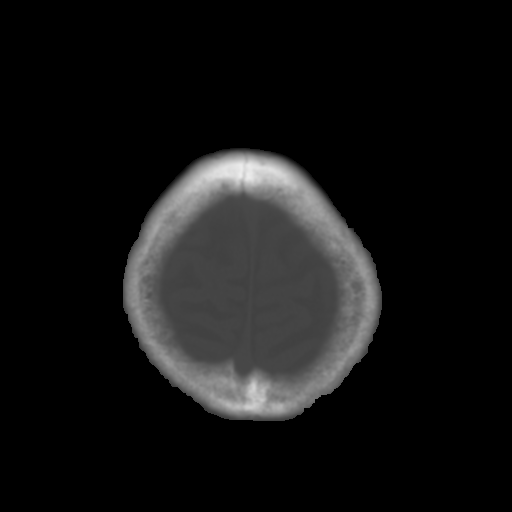
[im 27/36  brain]
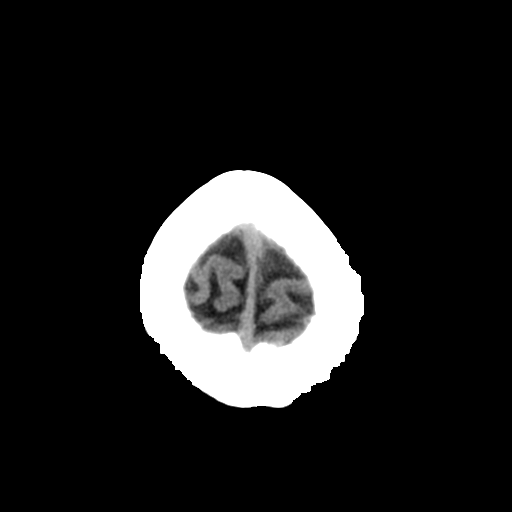
[im 29/36  brain]
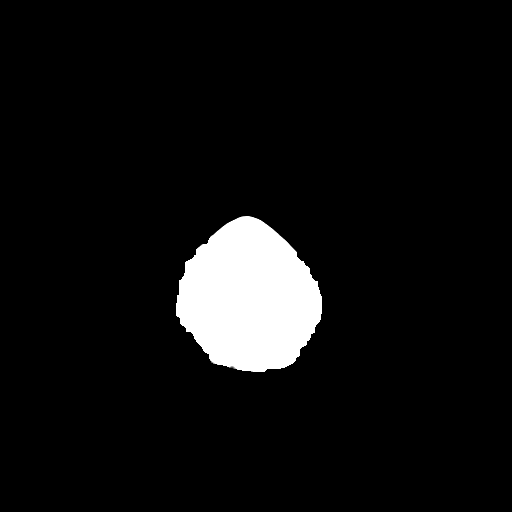
[im 32/36  brain]
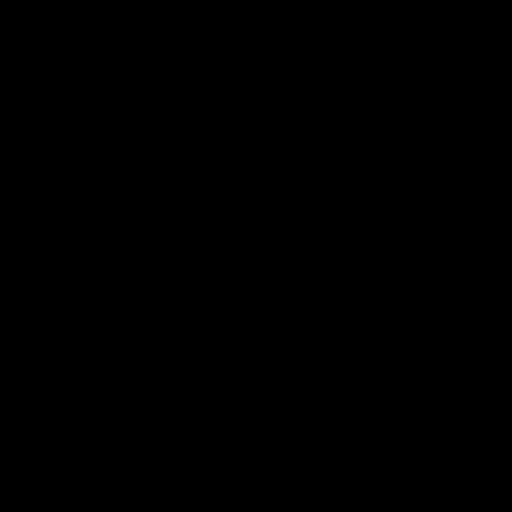

[16 of 30 positions shown; findings below may reference images not displayed]

FINDINGS: Mild atrophy. Negative for hydrocephalus. Mild chronic microvascular
ischemic change in the white matter.

Negative for acute infarct. Negative for hemorrhage or mass. No
edema or shift of the midline structures

Calvarium intact. Paranasal sinuses are clear. Carotid artery
calcification.
IMPRESSION: Chronic microvascular ischemic change and mild atrophy. No acute
intracranial abnormality.

## 2015-11-17 DIAGNOSIS — E782 Mixed hyperlipidemia: Secondary | ICD-10-CM | POA: Diagnosis not present

## 2015-11-17 DIAGNOSIS — E46 Unspecified protein-calorie malnutrition: Secondary | ICD-10-CM | POA: Diagnosis not present

## 2015-11-17 DIAGNOSIS — I10 Essential (primary) hypertension: Secondary | ICD-10-CM | POA: Diagnosis not present

## 2015-11-17 DIAGNOSIS — E039 Hypothyroidism, unspecified: Secondary | ICD-10-CM | POA: Diagnosis not present

## 2015-11-19 DIAGNOSIS — I2510 Atherosclerotic heart disease of native coronary artery without angina pectoris: Secondary | ICD-10-CM | POA: Diagnosis not present

## 2015-11-19 DIAGNOSIS — D509 Iron deficiency anemia, unspecified: Secondary | ICD-10-CM | POA: Diagnosis not present

## 2015-11-19 DIAGNOSIS — E039 Hypothyroidism, unspecified: Secondary | ICD-10-CM | POA: Diagnosis not present

## 2015-11-28 DIAGNOSIS — B351 Tinea unguium: Secondary | ICD-10-CM | POA: Diagnosis not present

## 2015-11-28 DIAGNOSIS — L851 Acquired keratosis [keratoderma] palmaris et plantaris: Secondary | ICD-10-CM | POA: Diagnosis not present

## 2015-11-28 DIAGNOSIS — G629 Polyneuropathy, unspecified: Secondary | ICD-10-CM | POA: Diagnosis not present

## 2015-11-28 DIAGNOSIS — I739 Peripheral vascular disease, unspecified: Secondary | ICD-10-CM | POA: Diagnosis not present

## 2015-12-09 ENCOUNTER — Encounter: Payer: Self-pay | Admitting: Cardiology

## 2015-12-09 ENCOUNTER — Ambulatory Visit (INDEPENDENT_AMBULATORY_CARE_PROVIDER_SITE_OTHER): Payer: Medicare Other | Admitting: Cardiology

## 2015-12-09 VITALS — BP 128/62 | HR 60 | Ht 72.0 in | Wt 133.0 lb

## 2015-12-09 DIAGNOSIS — R001 Bradycardia, unspecified: Secondary | ICD-10-CM | POA: Diagnosis not present

## 2015-12-09 DIAGNOSIS — I6523 Occlusion and stenosis of bilateral carotid arteries: Secondary | ICD-10-CM

## 2015-12-09 DIAGNOSIS — I10 Essential (primary) hypertension: Secondary | ICD-10-CM

## 2015-12-09 DIAGNOSIS — I2510 Atherosclerotic heart disease of native coronary artery without angina pectoris: Secondary | ICD-10-CM | POA: Diagnosis not present

## 2015-12-09 NOTE — Patient Instructions (Signed)
Medication Instructions:  Your physician recommends that you continue on your current medications as directed. Please refer to the Current Medication list given to you today.   Labwork: I will request your labs from your PCP  Testing/Procedures: NONE  Follow-Up: Your physician wants you to follow-up in: 6 MONTHS .  You will receive a reminder letter in the mail two months in advance. If you don't receive a letter, please call our office to schedule the follow-up appointment.   Any Other Special Instructions Will Be Listed Below (If Applicable).     If you need a refill on your cardiac medications before your next appointment, please call your pharmacy.

## 2015-12-09 NOTE — Progress Notes (Signed)
Patient ID: Luis Guerra, male   DOB: [birth year], 82 y.o.   MRN: [id number]     Clinical Summary [name] is a 82 y.o.male seen today for follow up of the following medical problems.   1. CAD  - last cath 2008 with occluded D2, has been medically managed  - he is not on ASA because of allergy. Compliant with plavix for seconary prevetntion  - denies any chest pain. No SOB or DOE since last visit - compliant with meds  2. Carotid stenosis  - occluded RICA by Doppler 12/2012  - medically managed  - no recent neuro symptoms  3. Bradycardia  - per notes noted to have a 4 second pause during a prior hospitalization 12/2012, was not felt to be a good candidate for a pacemaker at that time.  - can have some orthostatic dizziness at times, overall stable. Can have some vertigo better with prn meclizine   4. HTN  - compliant with meds   5. LE edema - compliant with compression stockings - avoiding diuretics due to orhtostatic symptoms - since last visit edema has been controlled    Past Medical History  Diagnosis Date  . Coronary artery disease     WITH HISTORY OF OCCLUSION OF A DIAGONAL Luis Guerra  . SBO (small bowel obstruction) ([place])   . Carotid occlusion, right   . COPD (chronic obstructive pulmonary disease) ([place])   . Gastritis   . AAA (abdominal aortic aneurysm) ([place])   . Hiatal hernia   . Weight loss   . Dysphagia   . Hypothyroidism   . Cancer ([place])     stomach cancer     Allergies  Allergen Reactions  . Aspirin Swelling  . Esomeprazole Magnesium Diarrhea  . Niacin Swelling  . Penicillins Swelling    Has patient had a PCN reaction causing immediate rash, facial/tongue/throat swelling, SOB or lightheadedness with hypotension: YES Has patient had a PCN reaction causing severe rash involving mucus membranes or skin necrosis: NO Has patient had a PCN reaction that required hospitalization: NO Has patient had a PCN reaction occurring within the last 10  years: NO If all of the above answers are "NO", then may proceed with Cephalosporin use.      Current Outpatient Prescriptions  Medication Sig Dispense Refill  . acetaminophen (TYLENOL) 500 MG tablet Take 1,000 mg by mouth 2 (two) times daily.     . clopidogrel (PLAVIX) 75 MG tablet Take 75 mg by mouth daily.    . fexofenadine (ALLEGRA) 180 MG tablet Take 180 mg by mouth daily.  5  . Fluticasone-Salmeterol (ADVAIR DISKUS) 250-50 MCG/DOSE AEPB Inhale 1 puff into the lungs every 12 (twelve) hours. Reported on 09/16/2015    . levothyroxine (SYNTHROID, LEVOTHROID) 125 MCG tablet Take 125 mcg by mouth daily.      Marland Kitchen losartan (COZAAR) 25 MG tablet Take 0.5 tablets (12.5 mg total) by mouth daily. 15 tablet 6  . meclizine (ANTIVERT) 25 MG tablet Take 1 tablet (25 mg total) by mouth 2 (two) times daily as needed for dizziness. 20 tablet 0  . Multiple Vitamin (MULTIVITAMIN) tablet Take 1 tablet by mouth every evening.     Marland Kitchen olopatadine (PATANOL) 0.1 % ophthalmic solution Place 1 drop into both eyes daily as needed for allergies.     . pantoprazole (PROTONIX) 40 MG tablet TAKE 1 TABLET BY MOUTH EVERY DAY 30 tablet 5  . Probiotic Product (ALIGN) 4 MG CAPS Take 4 mg by mouth daily.     Marland Kitchen  theophylline (THEODUR) 300 MG 12 hr tablet take one tablet by mouth twice daily  4   No current facility-administered medications for this visit.     Past Surgical History  Procedure Laterality Date  . Abdominal aortic aneurysm repair    . Cardiovascular stress test  05/03/2006    EF 60%  . Upper gastrointestinal endoscopy  07/13/06  . Upper gastrointestinal endoscopy  02/19/05  . Upper gastrointestinal endoscopy  07/22/2010    EGD ED  . Colonoscopy  07/22/2010  . Hernia repair    . Esophagogastroduodenoscopy  05/26/2011    Procedure: ESOPHAGOGASTRODUODENOSCOPY (EGD);  Surgeon: Luis Houston, MD;  [facility];  Service: Endoscopy;  Laterality: N/A;  3:00  . Balloon dilation  05/26/2011    Procedure:  BALLOON DILATION;  Surgeon: Luis Houston, MD;  [facility];  Service: Endoscopy;  Laterality: N/A;  . Esophagogastroduodenoscopy  07/21/2012    Procedure: ESOPHAGOGASTRODUODENOSCOPY (EGD);  Surgeon: Luis Houston, MD;  [facility];  Service: Endoscopy;  Laterality: N/A;  325-changed to 225 Ann to notify pt  . Cholecystectomy    . Low back surgery    . Cardiac catheterization  06/08/2007    EF 55%  . Back surgery       Allergies  Allergen Reactions  . Aspirin Swelling  . Esomeprazole Magnesium Diarrhea  . Niacin Swelling  . Penicillins Swelling    Has patient had a PCN reaction causing immediate rash, facial/tongue/throat swelling, SOB or lightheadedness with hypotension: YES Has patient had a PCN reaction causing severe rash involving mucus membranes or skin necrosis: NO Has patient had a PCN reaction that required hospitalization: NO Has patient had a PCN reaction occurring within the last 10 years: NO If all of the above answers are "NO", then may proceed with Cephalosporin use.       Family History  Problem Relation Age of Onset  . Heart disease Father   . Healthy Sister   . Heart disease Sister   . Stroke Sister   . Stroke Daughter   . Diabetes Daughter   . Healthy Son      Social History Luis Guerra reports that he quit smoking about 58 years ago. He has never used smokeless tobacco. Luis Guerra reports that he does not drink alcohol.   Review of Systems CONSTITUTIONAL: No weight loss, fever, chills, weakness or fatigue.  HEENT: Eyes: No visual loss, blurred vision, double vision or yellow sclerae.No hearing loss, sneezing, congestion, runny nose or sore throat.  SKIN: No rash or itching.  CARDIOVASCULAR: per HPI RESPIRATORY: No shortness of breath, cough or sputum.  GASTROINTESTINAL: No anorexia, nausea, vomiting or diarrhea. No abdominal pain or blood.  GENITOURINARY: No burning on urination, no polyuria NEUROLOGICAL: No headache,  dizziness, syncope, paralysis, ataxia, numbness or tingling in the extremities. No change in bowel or bladder control.  MUSCULOSKELETAL: No muscle, back pain, joint pain or stiffness.  LYMPHATICS: No enlarged nodes. No history of splenectomy.  PSYCHIATRIC: No history of depression or anxiety.  ENDOCRINOLOGIC: No reports of sweating, cold or heat intolerance. No polyuria or polydipsia.  Marland Kitchen   Physical Examination Filed Vitals:   12/09/15 1314  BP: 128/62  Pulse: 60   Filed Vitals:   12/09/15 1314  Height: 6' (1.829 m)  Weight: 133 lb (60.328 kg)    Gen: resting comfortably, no acute distress HEENT: no scleral icterus, pupils equal round and reactive, no palptable cervical adenopathy,  CV: RRR, 2/6 systolic  murmur RUSB, no jvd Resp: Clear to auscultation bilaterally GI: abdomen is soft, non-tender, non-distended, normal bowel sounds, no hepatosplenomegaly MSK: extremities are warm, no edema.  Skin: warm, no rash Neuro:  no focal deficits Psych: appropriate affect   Diagnostic Studies  12/2012 Echo  LVEF 55-60%, mild MR, mod LVH,    Assessment and Plan  1. CAD  - no current symptoms  - we will continue current meds  2. Carotid stenosis  - known occluded right ICA from prior studies  - no current neuro symptoms, continue current meds - will not continue surveillance imaging, with age and fragility he would not be a good candidate for preventative surgery  3. Bradycardia  - occasional orthostatic dizziness only - continue to monitor  4. HTN  - at goal, continue current meds. Avoid over aggressive therapy due to orthostatic symptoms and age.   5. LE edema - better with compression stockings. Avoid diuretics due to orthostatic symptoms - continue to monitor   F/u 6 months   Arnoldo Lenis, M.D.

## 2016-01-21 DIAGNOSIS — I6529 Occlusion and stenosis of unspecified carotid artery: Secondary | ICD-10-CM | POA: Diagnosis not present

## 2016-01-21 DIAGNOSIS — J449 Chronic obstructive pulmonary disease, unspecified: Secondary | ICD-10-CM | POA: Diagnosis not present

## 2016-01-21 DIAGNOSIS — E039 Hypothyroidism, unspecified: Secondary | ICD-10-CM | POA: Diagnosis not present

## 2016-02-13 DIAGNOSIS — I739 Peripheral vascular disease, unspecified: Secondary | ICD-10-CM | POA: Diagnosis not present

## 2016-02-13 DIAGNOSIS — L851 Acquired keratosis [keratoderma] palmaris et plantaris: Secondary | ICD-10-CM | POA: Diagnosis not present

## 2016-02-13 DIAGNOSIS — G629 Polyneuropathy, unspecified: Secondary | ICD-10-CM | POA: Diagnosis not present

## 2016-02-13 DIAGNOSIS — B351 Tinea unguium: Secondary | ICD-10-CM | POA: Diagnosis not present

## 2016-03-30 DIAGNOSIS — A881 Epidemic vertigo: Secondary | ICD-10-CM | POA: Diagnosis not present

## 2016-03-30 DIAGNOSIS — R490 Dysphonia: Secondary | ICD-10-CM | POA: Diagnosis not present

## 2016-03-30 DIAGNOSIS — J449 Chronic obstructive pulmonary disease, unspecified: Secondary | ICD-10-CM | POA: Diagnosis not present

## 2016-03-30 DIAGNOSIS — K219 Gastro-esophageal reflux disease without esophagitis: Secondary | ICD-10-CM | POA: Diagnosis not present

## 2016-04-08 ENCOUNTER — Telehealth: Payer: Self-pay | Admitting: Cardiology

## 2016-04-08 DIAGNOSIS — J3801 Paralysis of vocal cords and larynx, unilateral: Secondary | ICD-10-CM | POA: Diagnosis not present

## 2016-04-08 DIAGNOSIS — R490 Dysphonia: Secondary | ICD-10-CM | POA: Diagnosis not present

## 2016-04-08 DIAGNOSIS — J387 Other diseases of larynx: Secondary | ICD-10-CM | POA: Diagnosis not present

## 2016-04-08 NOTE — Telephone Encounter (Signed)
Will forward to Dr Deliah Boston pre-op clearance letter from ENT doctor

## 2016-04-12 NOTE — Telephone Encounter (Signed)
Ok to proceed with ENT surgery, they may send any documents they need to [place] for signature  Zandra Abts MD

## 2016-04-12 NOTE — Telephone Encounter (Signed)
Dr Luvenia Starch [phone] [facility], I requested clearance form to be faxed to [place].They desire a letter,will fax to [phone]

## 2016-04-19 DIAGNOSIS — I739 Peripheral vascular disease, unspecified: Secondary | ICD-10-CM | POA: Diagnosis not present

## 2016-04-19 DIAGNOSIS — C320 Malignant neoplasm of glottis: Secondary | ICD-10-CM | POA: Diagnosis not present

## 2016-04-19 DIAGNOSIS — E039 Hypothyroidism, unspecified: Secondary | ICD-10-CM | POA: Diagnosis not present

## 2016-04-19 DIAGNOSIS — C321 Malignant neoplasm of supraglottis: Secondary | ICD-10-CM | POA: Diagnosis not present

## 2016-04-19 DIAGNOSIS — Z86718 Personal history of other venous thrombosis and embolism: Secondary | ICD-10-CM | POA: Diagnosis not present

## 2016-04-19 DIAGNOSIS — Z886 Allergy status to analgesic agent status: Secondary | ICD-10-CM | POA: Diagnosis not present

## 2016-04-19 DIAGNOSIS — J3801 Paralysis of vocal cords and larynx, unilateral: Secondary | ICD-10-CM | POA: Diagnosis not present

## 2016-04-19 DIAGNOSIS — Z888 Allergy status to other drugs, medicaments and biological substances status: Secondary | ICD-10-CM | POA: Diagnosis not present

## 2016-04-19 DIAGNOSIS — K219 Gastro-esophageal reflux disease without esophagitis: Secondary | ICD-10-CM | POA: Diagnosis not present

## 2016-04-19 DIAGNOSIS — R490 Dysphonia: Secondary | ICD-10-CM | POA: Diagnosis not present

## 2016-04-19 DIAGNOSIS — Z880 Allergy status to penicillin: Secondary | ICD-10-CM | POA: Diagnosis not present

## 2016-04-19 DIAGNOSIS — Z8589 Personal history of malignant neoplasm of other organs and systems: Secondary | ICD-10-CM | POA: Diagnosis not present

## 2016-04-19 DIAGNOSIS — J45909 Unspecified asthma, uncomplicated: Secondary | ICD-10-CM | POA: Diagnosis not present

## 2016-04-22 DIAGNOSIS — B351 Tinea unguium: Secondary | ICD-10-CM | POA: Diagnosis not present

## 2016-04-22 DIAGNOSIS — I739 Peripheral vascular disease, unspecified: Secondary | ICD-10-CM | POA: Diagnosis not present

## 2016-04-22 DIAGNOSIS — G629 Polyneuropathy, unspecified: Secondary | ICD-10-CM | POA: Diagnosis not present

## 2016-04-22 DIAGNOSIS — L851 Acquired keratosis [keratoderma] palmaris et plantaris: Secondary | ICD-10-CM | POA: Diagnosis not present

## 2016-04-26 DIAGNOSIS — J3801 Paralysis of vocal cords and larynx, unilateral: Secondary | ICD-10-CM | POA: Diagnosis not present

## 2016-04-26 DIAGNOSIS — Z8589 Personal history of malignant neoplasm of other organs and systems: Secondary | ICD-10-CM | POA: Diagnosis not present

## 2016-04-26 DIAGNOSIS — R490 Dysphonia: Secondary | ICD-10-CM | POA: Diagnosis not present

## 2016-04-26 DIAGNOSIS — C320 Malignant neoplasm of glottis: Secondary | ICD-10-CM | POA: Diagnosis not present

## 2016-04-26 DIAGNOSIS — J383 Other diseases of vocal cords: Secondary | ICD-10-CM | POA: Diagnosis not present

## 2016-04-26 DIAGNOSIS — E039 Hypothyroidism, unspecified: Secondary | ICD-10-CM | POA: Diagnosis not present

## 2016-04-26 DIAGNOSIS — J387 Other diseases of larynx: Secondary | ICD-10-CM | POA: Diagnosis not present

## 2016-04-26 DIAGNOSIS — C321 Malignant neoplasm of supraglottis: Secondary | ICD-10-CM | POA: Diagnosis not present

## 2016-05-04 DIAGNOSIS — C329 Malignant neoplasm of larynx, unspecified: Secondary | ICD-10-CM | POA: Diagnosis not present

## 2016-05-04 DIAGNOSIS — R490 Dysphonia: Secondary | ICD-10-CM | POA: Diagnosis not present

## 2016-05-14 DIAGNOSIS — Z888 Allergy status to other drugs, medicaments and biological substances status: Secondary | ICD-10-CM | POA: Diagnosis not present

## 2016-05-14 DIAGNOSIS — K219 Gastro-esophageal reflux disease without esophagitis: Secondary | ICD-10-CM | POA: Diagnosis not present

## 2016-05-14 DIAGNOSIS — E039 Hypothyroidism, unspecified: Secondary | ICD-10-CM | POA: Diagnosis not present

## 2016-05-14 DIAGNOSIS — C329 Malignant neoplasm of larynx, unspecified: Secondary | ICD-10-CM | POA: Diagnosis not present

## 2016-05-14 DIAGNOSIS — Z881 Allergy status to other antibiotic agents status: Secondary | ICD-10-CM | POA: Diagnosis not present

## 2016-05-14 DIAGNOSIS — C320 Malignant neoplasm of glottis: Secondary | ICD-10-CM | POA: Diagnosis not present

## 2016-05-17 DIAGNOSIS — C329 Malignant neoplasm of larynx, unspecified: Secondary | ICD-10-CM | POA: Diagnosis not present

## 2016-05-21 DIAGNOSIS — R490 Dysphonia: Secondary | ICD-10-CM | POA: Diagnosis not present

## 2016-05-21 DIAGNOSIS — C329 Malignant neoplasm of larynx, unspecified: Secondary | ICD-10-CM | POA: Diagnosis not present

## 2016-05-24 DIAGNOSIS — I719 Aortic aneurysm of unspecified site, without rupture: Secondary | ICD-10-CM | POA: Diagnosis not present

## 2016-05-24 DIAGNOSIS — J439 Emphysema, unspecified: Secondary | ICD-10-CM | POA: Diagnosis not present

## 2016-05-24 DIAGNOSIS — I2510 Atherosclerotic heart disease of native coronary artery without angina pectoris: Secondary | ICD-10-CM | POA: Diagnosis not present

## 2016-05-24 DIAGNOSIS — I517 Cardiomegaly: Secondary | ICD-10-CM | POA: Diagnosis not present

## 2016-05-24 DIAGNOSIS — C329 Malignant neoplasm of larynx, unspecified: Secondary | ICD-10-CM | POA: Diagnosis not present

## 2016-05-24 DIAGNOSIS — I7410 Embolism and thrombosis of unspecified parts of aorta: Secondary | ICD-10-CM | POA: Diagnosis not present

## 2016-05-24 DIAGNOSIS — J9811 Atelectasis: Secondary | ICD-10-CM | POA: Diagnosis not present

## 2016-05-26 DIAGNOSIS — D518 Other vitamin B12 deficiency anemias: Secondary | ICD-10-CM | POA: Diagnosis not present

## 2016-05-26 DIAGNOSIS — I10 Essential (primary) hypertension: Secondary | ICD-10-CM | POA: Diagnosis not present

## 2016-05-26 DIAGNOSIS — E039 Hypothyroidism, unspecified: Secondary | ICD-10-CM | POA: Diagnosis not present

## 2016-05-26 DIAGNOSIS — E119 Type 2 diabetes mellitus without complications: Secondary | ICD-10-CM | POA: Diagnosis not present

## 2016-05-26 DIAGNOSIS — D509 Iron deficiency anemia, unspecified: Secondary | ICD-10-CM | POA: Diagnosis not present

## 2016-05-26 DIAGNOSIS — E782 Mixed hyperlipidemia: Secondary | ICD-10-CM | POA: Diagnosis not present

## 2016-05-26 DIAGNOSIS — I482 Chronic atrial fibrillation: Secondary | ICD-10-CM | POA: Diagnosis not present

## 2016-05-26 DIAGNOSIS — E785 Hyperlipidemia, unspecified: Secondary | ICD-10-CM | POA: Diagnosis not present

## 2016-05-27 DIAGNOSIS — Z510 Encounter for antineoplastic radiation therapy: Secondary | ICD-10-CM | POA: Diagnosis not present

## 2016-05-27 DIAGNOSIS — Z881 Allergy status to other antibiotic agents status: Secondary | ICD-10-CM | POA: Diagnosis not present

## 2016-05-27 DIAGNOSIS — C320 Malignant neoplasm of glottis: Secondary | ICD-10-CM | POA: Diagnosis not present

## 2016-05-27 DIAGNOSIS — Z888 Allergy status to other drugs, medicaments and biological substances status: Secondary | ICD-10-CM | POA: Diagnosis not present

## 2016-05-27 DIAGNOSIS — K219 Gastro-esophageal reflux disease without esophagitis: Secondary | ICD-10-CM | POA: Diagnosis not present

## 2016-05-27 DIAGNOSIS — E039 Hypothyroidism, unspecified: Secondary | ICD-10-CM | POA: Diagnosis not present

## 2016-05-28 DIAGNOSIS — E039 Hypothyroidism, unspecified: Secondary | ICD-10-CM | POA: Diagnosis not present

## 2016-05-28 DIAGNOSIS — C320 Malignant neoplasm of glottis: Secondary | ICD-10-CM | POA: Diagnosis not present

## 2016-05-28 DIAGNOSIS — I6529 Occlusion and stenosis of unspecified carotid artery: Secondary | ICD-10-CM | POA: Diagnosis not present

## 2016-05-28 DIAGNOSIS — Z510 Encounter for antineoplastic radiation therapy: Secondary | ICD-10-CM | POA: Diagnosis not present

## 2016-05-28 DIAGNOSIS — D509 Iron deficiency anemia, unspecified: Secondary | ICD-10-CM | POA: Diagnosis not present

## 2016-05-28 DIAGNOSIS — Z0000 Encounter for general adult medical examination without abnormal findings: Secondary | ICD-10-CM | POA: Diagnosis not present

## 2016-05-28 DIAGNOSIS — C329 Malignant neoplasm of larynx, unspecified: Secondary | ICD-10-CM | POA: Diagnosis not present

## 2016-05-28 DIAGNOSIS — Z23 Encounter for immunization: Secondary | ICD-10-CM | POA: Diagnosis not present

## 2016-05-29 DIAGNOSIS — Z888 Allergy status to other drugs, medicaments and biological substances status: Secondary | ICD-10-CM | POA: Diagnosis not present

## 2016-05-29 DIAGNOSIS — E039 Hypothyroidism, unspecified: Secondary | ICD-10-CM | POA: Diagnosis not present

## 2016-05-29 DIAGNOSIS — Z881 Allergy status to other antibiotic agents status: Secondary | ICD-10-CM | POA: Diagnosis not present

## 2016-05-29 DIAGNOSIS — K219 Gastro-esophageal reflux disease without esophagitis: Secondary | ICD-10-CM | POA: Diagnosis not present

## 2016-05-29 DIAGNOSIS — C320 Malignant neoplasm of glottis: Secondary | ICD-10-CM | POA: Diagnosis not present

## 2016-05-31 DIAGNOSIS — Z510 Encounter for antineoplastic radiation therapy: Secondary | ICD-10-CM | POA: Diagnosis not present

## 2016-05-31 DIAGNOSIS — R1310 Dysphagia, unspecified: Secondary | ICD-10-CM | POA: Diagnosis not present

## 2016-05-31 DIAGNOSIS — C320 Malignant neoplasm of glottis: Secondary | ICD-10-CM | POA: Diagnosis not present

## 2016-06-01 DIAGNOSIS — R1310 Dysphagia, unspecified: Secondary | ICD-10-CM | POA: Diagnosis not present

## 2016-06-01 DIAGNOSIS — Z510 Encounter for antineoplastic radiation therapy: Secondary | ICD-10-CM | POA: Diagnosis not present

## 2016-06-01 DIAGNOSIS — C320 Malignant neoplasm of glottis: Secondary | ICD-10-CM | POA: Diagnosis not present

## 2016-06-02 DIAGNOSIS — C320 Malignant neoplasm of glottis: Secondary | ICD-10-CM | POA: Diagnosis not present

## 2016-06-02 DIAGNOSIS — R1310 Dysphagia, unspecified: Secondary | ICD-10-CM | POA: Diagnosis not present

## 2016-06-02 DIAGNOSIS — Z510 Encounter for antineoplastic radiation therapy: Secondary | ICD-10-CM | POA: Diagnosis not present

## 2016-06-03 DIAGNOSIS — C320 Malignant neoplasm of glottis: Secondary | ICD-10-CM | POA: Diagnosis not present

## 2016-06-03 DIAGNOSIS — R1310 Dysphagia, unspecified: Secondary | ICD-10-CM | POA: Diagnosis not present

## 2016-06-03 DIAGNOSIS — Z510 Encounter for antineoplastic radiation therapy: Secondary | ICD-10-CM | POA: Diagnosis not present

## 2016-06-04 DIAGNOSIS — C320 Malignant neoplasm of glottis: Secondary | ICD-10-CM | POA: Diagnosis not present

## 2016-06-04 DIAGNOSIS — R1310 Dysphagia, unspecified: Secondary | ICD-10-CM | POA: Diagnosis not present

## 2016-06-04 DIAGNOSIS — Z510 Encounter for antineoplastic radiation therapy: Secondary | ICD-10-CM | POA: Diagnosis not present

## 2016-06-07 DIAGNOSIS — R1310 Dysphagia, unspecified: Secondary | ICD-10-CM | POA: Diagnosis not present

## 2016-06-07 DIAGNOSIS — Z510 Encounter for antineoplastic radiation therapy: Secondary | ICD-10-CM | POA: Diagnosis not present

## 2016-06-07 DIAGNOSIS — C320 Malignant neoplasm of glottis: Secondary | ICD-10-CM | POA: Diagnosis not present

## 2016-06-08 DIAGNOSIS — C320 Malignant neoplasm of glottis: Secondary | ICD-10-CM | POA: Diagnosis not present

## 2016-06-08 DIAGNOSIS — R1310 Dysphagia, unspecified: Secondary | ICD-10-CM | POA: Diagnosis not present

## 2016-06-08 DIAGNOSIS — Z510 Encounter for antineoplastic radiation therapy: Secondary | ICD-10-CM | POA: Diagnosis not present

## 2016-06-09 DIAGNOSIS — R1310 Dysphagia, unspecified: Secondary | ICD-10-CM | POA: Diagnosis not present

## 2016-06-09 DIAGNOSIS — Z510 Encounter for antineoplastic radiation therapy: Secondary | ICD-10-CM | POA: Diagnosis not present

## 2016-06-09 DIAGNOSIS — C320 Malignant neoplasm of glottis: Secondary | ICD-10-CM | POA: Diagnosis not present

## 2016-06-10 DIAGNOSIS — C320 Malignant neoplasm of glottis: Secondary | ICD-10-CM | POA: Diagnosis not present

## 2016-06-10 DIAGNOSIS — Z510 Encounter for antineoplastic radiation therapy: Secondary | ICD-10-CM | POA: Diagnosis not present

## 2016-06-10 DIAGNOSIS — R1310 Dysphagia, unspecified: Secondary | ICD-10-CM | POA: Diagnosis not present

## 2016-06-11 DIAGNOSIS — C320 Malignant neoplasm of glottis: Secondary | ICD-10-CM | POA: Diagnosis not present

## 2016-06-11 DIAGNOSIS — R1310 Dysphagia, unspecified: Secondary | ICD-10-CM | POA: Diagnosis not present

## 2016-06-11 DIAGNOSIS — Z510 Encounter for antineoplastic radiation therapy: Secondary | ICD-10-CM | POA: Diagnosis not present

## 2016-06-14 DIAGNOSIS — R1310 Dysphagia, unspecified: Secondary | ICD-10-CM | POA: Diagnosis not present

## 2016-06-14 DIAGNOSIS — C320 Malignant neoplasm of glottis: Secondary | ICD-10-CM | POA: Diagnosis not present

## 2016-06-14 DIAGNOSIS — Z510 Encounter for antineoplastic radiation therapy: Secondary | ICD-10-CM | POA: Diagnosis not present

## 2016-06-15 DIAGNOSIS — R1310 Dysphagia, unspecified: Secondary | ICD-10-CM | POA: Diagnosis not present

## 2016-06-15 DIAGNOSIS — C320 Malignant neoplasm of glottis: Secondary | ICD-10-CM | POA: Diagnosis not present

## 2016-06-15 DIAGNOSIS — Z510 Encounter for antineoplastic radiation therapy: Secondary | ICD-10-CM | POA: Diagnosis not present

## 2016-06-16 ENCOUNTER — Ambulatory Visit: Payer: Medicare Other | Admitting: Cardiology

## 2016-06-16 DIAGNOSIS — Z510 Encounter for antineoplastic radiation therapy: Secondary | ICD-10-CM | POA: Diagnosis not present

## 2016-06-16 DIAGNOSIS — C320 Malignant neoplasm of glottis: Secondary | ICD-10-CM | POA: Diagnosis not present

## 2016-06-16 DIAGNOSIS — R1310 Dysphagia, unspecified: Secondary | ICD-10-CM | POA: Diagnosis not present

## 2016-06-17 DIAGNOSIS — R1310 Dysphagia, unspecified: Secondary | ICD-10-CM | POA: Diagnosis not present

## 2016-06-17 DIAGNOSIS — C320 Malignant neoplasm of glottis: Secondary | ICD-10-CM | POA: Diagnosis not present

## 2016-06-17 DIAGNOSIS — Z510 Encounter for antineoplastic radiation therapy: Secondary | ICD-10-CM | POA: Diagnosis not present

## 2016-06-18 DIAGNOSIS — R1310 Dysphagia, unspecified: Secondary | ICD-10-CM | POA: Diagnosis not present

## 2016-06-18 DIAGNOSIS — Z510 Encounter for antineoplastic radiation therapy: Secondary | ICD-10-CM | POA: Diagnosis not present

## 2016-06-18 DIAGNOSIS — C320 Malignant neoplasm of glottis: Secondary | ICD-10-CM | POA: Diagnosis not present

## 2016-06-21 DIAGNOSIS — C320 Malignant neoplasm of glottis: Secondary | ICD-10-CM | POA: Diagnosis not present

## 2016-06-21 DIAGNOSIS — R1310 Dysphagia, unspecified: Secondary | ICD-10-CM | POA: Diagnosis not present

## 2016-06-21 DIAGNOSIS — Z510 Encounter for antineoplastic radiation therapy: Secondary | ICD-10-CM | POA: Diagnosis not present

## 2016-06-22 DIAGNOSIS — Z510 Encounter for antineoplastic radiation therapy: Secondary | ICD-10-CM | POA: Diagnosis not present

## 2016-06-22 DIAGNOSIS — R1310 Dysphagia, unspecified: Secondary | ICD-10-CM | POA: Diagnosis not present

## 2016-06-22 DIAGNOSIS — C320 Malignant neoplasm of glottis: Secondary | ICD-10-CM | POA: Diagnosis not present

## 2016-06-23 DIAGNOSIS — Z510 Encounter for antineoplastic radiation therapy: Secondary | ICD-10-CM | POA: Diagnosis not present

## 2016-06-23 DIAGNOSIS — C320 Malignant neoplasm of glottis: Secondary | ICD-10-CM | POA: Diagnosis not present

## 2016-06-23 DIAGNOSIS — R1310 Dysphagia, unspecified: Secondary | ICD-10-CM | POA: Diagnosis not present

## 2016-06-24 DIAGNOSIS — R1310 Dysphagia, unspecified: Secondary | ICD-10-CM | POA: Diagnosis not present

## 2016-06-24 DIAGNOSIS — Z510 Encounter for antineoplastic radiation therapy: Secondary | ICD-10-CM | POA: Diagnosis not present

## 2016-06-24 DIAGNOSIS — C320 Malignant neoplasm of glottis: Secondary | ICD-10-CM | POA: Diagnosis not present

## 2016-06-25 DIAGNOSIS — Z510 Encounter for antineoplastic radiation therapy: Secondary | ICD-10-CM | POA: Diagnosis not present

## 2016-06-25 DIAGNOSIS — C320 Malignant neoplasm of glottis: Secondary | ICD-10-CM | POA: Diagnosis not present

## 2016-06-25 DIAGNOSIS — R1310 Dysphagia, unspecified: Secondary | ICD-10-CM | POA: Diagnosis not present

## 2016-06-28 DIAGNOSIS — Z510 Encounter for antineoplastic radiation therapy: Secondary | ICD-10-CM | POA: Diagnosis not present

## 2016-06-28 DIAGNOSIS — C320 Malignant neoplasm of glottis: Secondary | ICD-10-CM | POA: Diagnosis not present

## 2016-06-28 DIAGNOSIS — R1310 Dysphagia, unspecified: Secondary | ICD-10-CM | POA: Diagnosis not present

## 2016-06-29 DIAGNOSIS — Z510 Encounter for antineoplastic radiation therapy: Secondary | ICD-10-CM | POA: Diagnosis not present

## 2016-06-29 DIAGNOSIS — R1310 Dysphagia, unspecified: Secondary | ICD-10-CM | POA: Diagnosis not present

## 2016-06-29 DIAGNOSIS — C320 Malignant neoplasm of glottis: Secondary | ICD-10-CM | POA: Diagnosis not present

## 2016-06-30 DIAGNOSIS — R490 Dysphonia: Secondary | ICD-10-CM | POA: Diagnosis not present

## 2016-06-30 DIAGNOSIS — Z510 Encounter for antineoplastic radiation therapy: Secondary | ICD-10-CM | POA: Diagnosis not present

## 2016-06-30 DIAGNOSIS — C320 Malignant neoplasm of glottis: Secondary | ICD-10-CM | POA: Diagnosis not present

## 2016-06-30 DIAGNOSIS — R1310 Dysphagia, unspecified: Secondary | ICD-10-CM | POA: Diagnosis not present

## 2016-07-01 DIAGNOSIS — R1310 Dysphagia, unspecified: Secondary | ICD-10-CM | POA: Diagnosis not present

## 2016-07-01 DIAGNOSIS — C320 Malignant neoplasm of glottis: Secondary | ICD-10-CM | POA: Diagnosis not present

## 2016-07-01 DIAGNOSIS — R490 Dysphonia: Secondary | ICD-10-CM | POA: Diagnosis not present

## 2016-07-01 DIAGNOSIS — Z510 Encounter for antineoplastic radiation therapy: Secondary | ICD-10-CM | POA: Diagnosis not present

## 2016-07-02 DIAGNOSIS — R1310 Dysphagia, unspecified: Secondary | ICD-10-CM | POA: Diagnosis not present

## 2016-07-02 DIAGNOSIS — R490 Dysphonia: Secondary | ICD-10-CM | POA: Diagnosis not present

## 2016-07-02 DIAGNOSIS — C320 Malignant neoplasm of glottis: Secondary | ICD-10-CM | POA: Diagnosis not present

## 2016-07-02 DIAGNOSIS — Z510 Encounter for antineoplastic radiation therapy: Secondary | ICD-10-CM | POA: Diagnosis not present

## 2016-07-05 DIAGNOSIS — C320 Malignant neoplasm of glottis: Secondary | ICD-10-CM | POA: Diagnosis not present

## 2016-07-05 DIAGNOSIS — Z510 Encounter for antineoplastic radiation therapy: Secondary | ICD-10-CM | POA: Diagnosis not present

## 2016-07-05 DIAGNOSIS — R490 Dysphonia: Secondary | ICD-10-CM | POA: Diagnosis not present

## 2016-07-05 DIAGNOSIS — R1310 Dysphagia, unspecified: Secondary | ICD-10-CM | POA: Diagnosis not present

## 2016-07-06 DIAGNOSIS — R1310 Dysphagia, unspecified: Secondary | ICD-10-CM | POA: Diagnosis not present

## 2016-07-06 DIAGNOSIS — R490 Dysphonia: Secondary | ICD-10-CM | POA: Diagnosis not present

## 2016-07-06 DIAGNOSIS — C320 Malignant neoplasm of glottis: Secondary | ICD-10-CM | POA: Diagnosis not present

## 2016-07-06 DIAGNOSIS — Z510 Encounter for antineoplastic radiation therapy: Secondary | ICD-10-CM | POA: Diagnosis not present

## 2016-07-07 DIAGNOSIS — R490 Dysphonia: Secondary | ICD-10-CM | POA: Diagnosis not present

## 2016-07-07 DIAGNOSIS — R1310 Dysphagia, unspecified: Secondary | ICD-10-CM | POA: Diagnosis not present

## 2016-07-07 DIAGNOSIS — Z510 Encounter for antineoplastic radiation therapy: Secondary | ICD-10-CM | POA: Diagnosis not present

## 2016-07-07 DIAGNOSIS — C320 Malignant neoplasm of glottis: Secondary | ICD-10-CM | POA: Diagnosis not present

## 2016-07-08 DIAGNOSIS — R490 Dysphonia: Secondary | ICD-10-CM | POA: Diagnosis not present

## 2016-07-08 DIAGNOSIS — R1310 Dysphagia, unspecified: Secondary | ICD-10-CM | POA: Diagnosis not present

## 2016-07-08 DIAGNOSIS — Z510 Encounter for antineoplastic radiation therapy: Secondary | ICD-10-CM | POA: Diagnosis not present

## 2016-07-08 DIAGNOSIS — C320 Malignant neoplasm of glottis: Secondary | ICD-10-CM | POA: Diagnosis not present

## 2016-07-09 DIAGNOSIS — C320 Malignant neoplasm of glottis: Secondary | ICD-10-CM | POA: Diagnosis not present

## 2016-07-09 DIAGNOSIS — Z510 Encounter for antineoplastic radiation therapy: Secondary | ICD-10-CM | POA: Diagnosis not present

## 2016-07-09 DIAGNOSIS — R490 Dysphonia: Secondary | ICD-10-CM | POA: Diagnosis not present

## 2016-07-09 DIAGNOSIS — R1310 Dysphagia, unspecified: Secondary | ICD-10-CM | POA: Diagnosis not present

## 2016-07-12 DIAGNOSIS — C320 Malignant neoplasm of glottis: Secondary | ICD-10-CM | POA: Diagnosis not present

## 2016-07-12 DIAGNOSIS — R490 Dysphonia: Secondary | ICD-10-CM | POA: Diagnosis not present

## 2016-07-12 DIAGNOSIS — R1310 Dysphagia, unspecified: Secondary | ICD-10-CM | POA: Diagnosis not present

## 2016-07-12 DIAGNOSIS — Z510 Encounter for antineoplastic radiation therapy: Secondary | ICD-10-CM | POA: Diagnosis not present

## 2016-07-13 DIAGNOSIS — R1310 Dysphagia, unspecified: Secondary | ICD-10-CM | POA: Diagnosis not present

## 2016-07-13 DIAGNOSIS — R490 Dysphonia: Secondary | ICD-10-CM | POA: Diagnosis not present

## 2016-07-13 DIAGNOSIS — Z510 Encounter for antineoplastic radiation therapy: Secondary | ICD-10-CM | POA: Diagnosis not present

## 2016-07-13 DIAGNOSIS — C320 Malignant neoplasm of glottis: Secondary | ICD-10-CM | POA: Diagnosis not present

## 2016-07-21 DIAGNOSIS — J387 Other diseases of larynx: Secondary | ICD-10-CM | POA: Diagnosis not present

## 2016-07-21 DIAGNOSIS — R490 Dysphonia: Secondary | ICD-10-CM | POA: Diagnosis not present

## 2016-07-27 ENCOUNTER — Encounter (INDEPENDENT_AMBULATORY_CARE_PROVIDER_SITE_OTHER): Payer: Self-pay

## 2016-07-27 ENCOUNTER — Encounter (INDEPENDENT_AMBULATORY_CARE_PROVIDER_SITE_OTHER): Payer: Self-pay | Admitting: Internal Medicine

## 2016-08-18 DIAGNOSIS — R490 Dysphonia: Secondary | ICD-10-CM | POA: Diagnosis not present

## 2016-08-18 DIAGNOSIS — Z923 Personal history of irradiation: Secondary | ICD-10-CM | POA: Diagnosis not present

## 2016-08-18 DIAGNOSIS — C320 Malignant neoplasm of glottis: Secondary | ICD-10-CM | POA: Diagnosis not present

## 2016-09-02 DIAGNOSIS — H02109 Unspecified ectropion of unspecified eye, unspecified eyelid: Secondary | ICD-10-CM | POA: Diagnosis not present

## 2016-09-06 DIAGNOSIS — H02101 Unspecified ectropion of right upper eyelid: Secondary | ICD-10-CM | POA: Diagnosis not present

## 2016-09-15 ENCOUNTER — Ambulatory Visit (INDEPENDENT_AMBULATORY_CARE_PROVIDER_SITE_OTHER): Payer: Medicare Other | Admitting: Internal Medicine

## 2016-09-21 DIAGNOSIS — R490 Dysphonia: Secondary | ICD-10-CM | POA: Diagnosis not present

## 2016-09-21 DIAGNOSIS — C329 Malignant neoplasm of larynx, unspecified: Secondary | ICD-10-CM | POA: Diagnosis not present

## 2016-10-22 DIAGNOSIS — Z888 Allergy status to other drugs, medicaments and biological substances status: Secondary | ICD-10-CM | POA: Diagnosis not present

## 2016-10-22 DIAGNOSIS — Z886 Allergy status to analgesic agent status: Secondary | ICD-10-CM | POA: Diagnosis not present

## 2016-10-22 DIAGNOSIS — I712 Thoracic aortic aneurysm, without rupture: Secondary | ICD-10-CM | POA: Diagnosis not present

## 2016-10-22 DIAGNOSIS — K56699 Other intestinal obstruction unspecified as to partial versus complete obstruction: Secondary | ICD-10-CM | POA: Diagnosis not present

## 2016-10-22 DIAGNOSIS — Z880 Allergy status to penicillin: Secondary | ICD-10-CM | POA: Diagnosis not present

## 2016-10-22 DIAGNOSIS — J449 Chronic obstructive pulmonary disease, unspecified: Secondary | ICD-10-CM | POA: Diagnosis not present

## 2016-10-22 DIAGNOSIS — Z8679 Personal history of other diseases of the circulatory system: Secondary | ICD-10-CM | POA: Diagnosis not present

## 2016-10-22 DIAGNOSIS — I10 Essential (primary) hypertension: Secondary | ICD-10-CM | POA: Diagnosis not present

## 2016-10-22 DIAGNOSIS — I7101 Dissection of thoracic aorta: Secondary | ICD-10-CM | POA: Diagnosis not present

## 2016-10-23 DIAGNOSIS — I701 Atherosclerosis of renal artery: Secondary | ICD-10-CM | POA: Diagnosis not present

## 2016-10-23 DIAGNOSIS — Z903 Acquired absence of stomach [part of]: Secondary | ICD-10-CM | POA: Diagnosis not present

## 2016-10-23 DIAGNOSIS — E039 Hypothyroidism, unspecified: Secondary | ICD-10-CM | POA: Diagnosis present

## 2016-10-23 DIAGNOSIS — Z4682 Encounter for fitting and adjustment of non-vascular catheter: Secondary | ICD-10-CM | POA: Diagnosis not present

## 2016-10-23 DIAGNOSIS — R410 Disorientation, unspecified: Secondary | ICD-10-CM | POA: Diagnosis not present

## 2016-10-23 DIAGNOSIS — R918 Other nonspecific abnormal finding of lung field: Secondary | ICD-10-CM | POA: Diagnosis not present

## 2016-10-23 DIAGNOSIS — I160 Hypertensive urgency: Secondary | ICD-10-CM | POA: Diagnosis not present

## 2016-10-23 DIAGNOSIS — Z886 Allergy status to analgesic agent status: Secondary | ICD-10-CM | POA: Diagnosis not present

## 2016-10-23 DIAGNOSIS — K56699 Other intestinal obstruction unspecified as to partial versus complete obstruction: Secondary | ICD-10-CM | POA: Diagnosis not present

## 2016-10-23 DIAGNOSIS — K219 Gastro-esophageal reflux disease without esophagitis: Secondary | ICD-10-CM | POA: Diagnosis present

## 2016-10-23 DIAGNOSIS — Z9889 Other specified postprocedural states: Secondary | ICD-10-CM | POA: Diagnosis not present

## 2016-10-23 DIAGNOSIS — Z9049 Acquired absence of other specified parts of digestive tract: Secondary | ICD-10-CM | POA: Diagnosis not present

## 2016-10-23 DIAGNOSIS — K56600 Partial intestinal obstruction, unspecified as to cause: Secondary | ICD-10-CM | POA: Diagnosis not present

## 2016-10-23 DIAGNOSIS — R1084 Generalized abdominal pain: Secondary | ICD-10-CM | POA: Diagnosis not present

## 2016-10-23 DIAGNOSIS — J449 Chronic obstructive pulmonary disease, unspecified: Secondary | ICD-10-CM | POA: Diagnosis present

## 2016-10-23 DIAGNOSIS — Z66 Do not resuscitate: Secondary | ICD-10-CM | POA: Diagnosis present

## 2016-10-23 DIAGNOSIS — K56609 Unspecified intestinal obstruction, unspecified as to partial versus complete obstruction: Secondary | ICD-10-CM | POA: Diagnosis not present

## 2016-10-23 DIAGNOSIS — I161 Hypertensive emergency: Secondary | ICD-10-CM | POA: Diagnosis present

## 2016-10-23 DIAGNOSIS — I7101 Dissection of thoracic aorta: Secondary | ICD-10-CM | POA: Diagnosis not present

## 2016-10-23 DIAGNOSIS — I10 Essential (primary) hypertension: Secondary | ICD-10-CM | POA: Diagnosis not present

## 2016-10-23 DIAGNOSIS — R109 Unspecified abdominal pain: Secondary | ICD-10-CM | POA: Diagnosis not present

## 2016-10-23 DIAGNOSIS — K551 Chronic vascular disorders of intestine: Secondary | ICD-10-CM | POA: Diagnosis not present

## 2016-10-23 DIAGNOSIS — I7102 Dissection of abdominal aorta: Secondary | ICD-10-CM | POA: Diagnosis present

## 2016-10-23 DIAGNOSIS — I712 Thoracic aortic aneurysm, without rupture: Secondary | ICD-10-CM | POA: Diagnosis not present

## 2016-10-23 DIAGNOSIS — Z7401 Bed confinement status: Secondary | ICD-10-CM | POA: Diagnosis not present

## 2016-10-23 DIAGNOSIS — I7100 Dissection of unspecified site of aorta: Secondary | ICD-10-CM | POA: Diagnosis not present

## 2016-10-23 DIAGNOSIS — M2550 Pain in unspecified joint: Secondary | ICD-10-CM | POA: Diagnosis not present

## 2016-10-29 ENCOUNTER — Ambulatory Visit (INDEPENDENT_AMBULATORY_CARE_PROVIDER_SITE_OTHER): Payer: Medicare Other | Admitting: Cardiology

## 2016-10-29 ENCOUNTER — Encounter: Payer: Self-pay | Admitting: Cardiology

## 2016-10-29 VITALS — BP 186/84 | HR 66 | Ht 72.0 in | Wt 138.0 lb

## 2016-10-29 DIAGNOSIS — I2510 Atherosclerotic heart disease of native coronary artery without angina pectoris: Secondary | ICD-10-CM

## 2016-10-29 DIAGNOSIS — I7102 Dissection of abdominal aorta: Secondary | ICD-10-CM | POA: Diagnosis not present

## 2016-10-29 DIAGNOSIS — I10 Essential (primary) hypertension: Secondary | ICD-10-CM | POA: Diagnosis not present

## 2016-10-29 DIAGNOSIS — I6523 Occlusion and stenosis of bilateral carotid arteries: Secondary | ICD-10-CM | POA: Diagnosis not present

## 2016-10-29 DIAGNOSIS — R001 Bradycardia, unspecified: Secondary | ICD-10-CM | POA: Diagnosis not present

## 2016-10-29 MED ORDER — LOSARTAN POTASSIUM 25 MG PO TABS: 12.5000 mg | ORAL_TABLET | Freq: Every day | ORAL | 3 refills | Status: DC

## 2016-10-29 NOTE — Patient Instructions (Signed)
Medication Instructions:   Begin Losartan 12.5mg  daily.  Continue all other medications.    Labwork: none  Testing/Procedures: none  Follow-Up: Your physician wants you to follow up in: 6 months.  You will receive a reminder letter in the mail one-two months in advance.  If you don't receive a letter, please call our office to schedule the follow up appointment   Any Other Special Instructions Will Be Listed Below (If Applicable).  If you need a refill on your cardiac medications before your next appointment, please call your pharmacy.

## 2016-10-29 NOTE — Progress Notes (Signed)
Clinical Summary Luis Guerra is a 83 y.o.male  seen today for follow up of the following medical problems.   1. CAD  - last cath 2008 with occluded D2, has been medically managed  - he is not on ASA because of allergy. Compliant with plavix for seconary prevetntion  - no recent chest pain. No SOB or DOE  - compliant with meds  2. Carotid stenosis  - occluded RICA by Doppler 12/2012  - medically managed  - denies any recent neuro symptoms  3. Bradycardia  - per notes noted to have a 4 second pause during a prior hospitalization 12/2012, was not felt to be a good candidate for a pacemaker at that time.   - no recent symptoms   4. HTN  - he is compliant with meds   5. LE edema - compliant with compression stockings - avoiding diuretics due to orhtostatic symptoms - reports swelling is doing well since last visit.    6. Aortic dissection - admitted to [facility] 09/2016 with abdominal pain and HTN with SBPs in 200s. 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 Also found to have ileus during that admission    7. AAA - history of prior repair Past Medical History:  Diagnosis Date  . AAA (abdominal aortic aneurysm) ([place])   . Cancer ([place])    stomach cancer  . Carotid occlusion, right   . COPD (chronic obstructive pulmonary disease) ([place])   . Coronary artery disease    WITH HISTORY OF OCCLUSION OF A DIAGONAL Luis Guerra  . Dysphagia   . Gastritis   . Hiatal hernia   . Hypothyroidism   . SBO (small bowel obstruction)   . Weight loss      Allergies  Allergen Reactions  . Aspirin Swelling  . Esomeprazole Magnesium Diarrhea  . Niacin Swelling  . Penicillins Swelling    Has patient had a PCN reaction causing immediate rash, facial/tongue/throat swelling, SOB or lightheadedness with hypotension: YES Has patient had a PCN reaction causing  severe rash involving mucus membranes or skin necrosis: NO Has patient had a PCN reaction that required hospitalization: NO Has patient had a PCN reaction occurring within the last 10 years: NO If all of the above answers are "NO", then may proceed with Cephalosporin use.      Current Outpatient Prescriptions  Medication Sig Dispense Refill  . acetaminophen (TYLENOL) 500 MG tablet Take 1,000 mg by mouth 2 (two) times daily.     . clopidogrel (PLAVIX) 75 MG tablet Take 75 mg by mouth daily.    . fexofenadine (ALLEGRA) 180 MG tablet Take 180 mg by mouth daily.  5  . Fluticasone-Salmeterol (ADVAIR DISKUS) 250-50 MCG/DOSE AEPB Inhale 1 puff into the lungs every 12 (twelve) hours. Reported on 09/16/2015    . levothyroxine (SYNTHROID, LEVOTHROID) 125 MCG tablet Take 125 mcg by mouth daily.      Marland Kitchen losartan (COZAAR) 25 MG tablet Take 0.5 tablets (12.5 mg total) by mouth daily. 15 tablet 6  . meclizine (ANTIVERT) 25 MG tablet Take 1 tablet (25 mg total) by mouth 2 (two) times daily as needed for dizziness. 20 tablet 0  . Multiple Vitamin (MULTIVITAMIN) tablet Take 1 tablet by mouth every evening.     Marland Kitchen olopatadine (PATANOL) 0.1 % ophthalmic solution Place 1 drop into both eyes daily as needed for allergies.     Marland Kitchen  pantoprazole (PROTONIX) 40 MG tablet TAKE 1 TABLET BY MOUTH EVERY DAY 30 tablet 5  . Probiotic Product (ALIGN) 4 MG CAPS Take 4 mg by mouth daily.     . theophylline (THEODUR) 300 MG 12 hr tablet take one tablet by mouth twice daily  4   No current facility-administered medications for this visit.      Past Surgical History:  Procedure Laterality Date  . ABDOMINAL AORTIC ANEURYSM REPAIR    . BACK SURGERY    . BALLOON DILATION  05/26/2011   Procedure: BALLOON DILATION;  Surgeon: Rogene Houston, MD;  [facility];  Service: Endoscopy;  Laterality: N/A;  . CARDIAC CATHETERIZATION  06/08/2007   EF 55%  . CARDIOVASCULAR STRESS TEST  05/03/2006   EF 60%  . CHOLECYSTECTOMY    .  COLONOSCOPY  07/22/2010  . ESOPHAGOGASTRODUODENOSCOPY  05/26/2011   Procedure: ESOPHAGOGASTRODUODENOSCOPY (EGD);  Surgeon: Rogene Houston, MD;  Location[facility];  Service: Endoscopy;  Laterality: N/A;  3:00  . ESOPHAGOGASTRODUODENOSCOPY  07/21/2012   Procedure: ESOPHAGOGASTRODUODENOSCOPY (EGD);  Surgeon: Rogene Houston, MD;  [facility];  Service: Endoscopy;  Laterality: N/A;  325-changed to 225 Ann to notify pt  . HERNIA REPAIR    . low back surgery    . UPPER GASTROINTESTINAL ENDOSCOPY  07/13/06  . UPPER GASTROINTESTINAL ENDOSCOPY  02/19/05  . UPPER GASTROINTESTINAL ENDOSCOPY  07/22/2010   EGD ED     Allergies  Allergen Reactions  . Aspirin Swelling  . Esomeprazole Magnesium Diarrhea  . Niacin Swelling  . Penicillins Swelling    Has patient had a PCN reaction causing immediate rash, facial/tongue/throat swelling, SOB or lightheadedness with hypotension: YES Has patient had a PCN reaction causing severe rash involving mucus membranes or skin necrosis: NO Has patient had a PCN reaction that required hospitalization: NO Has patient had a PCN reaction occurring within the last 10 years: NO If all of the above answers are "NO", then may proceed with Cephalosporin use.       Family History  Problem Relation Age of Onset  . Heart disease Father   . Healthy Sister   . Heart disease Sister   . Stroke Sister   . Stroke Daughter   . Diabetes Daughter   . Healthy Son      Social History Luis Guerra reports that he quit smoking about 59 years ago. He has never used smokeless tobacco. Luis Guerra reports that he does not drink alcohol.   Review of Systems CONSTITUTIONAL: No weight loss, fever, chills, weakness or fatigue.  HEENT: Eyes: No visual loss, blurred vision, double vision or yellow sclerae.No hearing loss, sneezing, congestion, runny nose or sore throat.  SKIN: No rash or itching.  CARDIOVASCULAR: per hpi RESPIRATORY: No shortness of breath, cough or  sputum.  GASTROINTESTINAL: No anorexia, nausea, vomiting or diarrhea. No abdominal pain or blood.  GENITOURINARY: No burning on urination, no polyuria NEUROLOGICAL: No headache, dizziness, syncope, paralysis, ataxia, numbness or tingling in the extremities. No change in bowel or bladder control.  MUSCULOSKELETAL: No muscle, back pain, joint pain or stiffness.  LYMPHATICS: No enlarged nodes. No history of splenectomy.  PSYCHIATRIC: No history of depression or anxiety.  ENDOCRINOLOGIC: No reports of sweating, cold or heat intolerance. No polyuria or polydipsia.  Marland Kitchen   Physical Examination Vitals:   10/29/16 1344  BP: (!) 186/84  Pulse: 66   Vitals:   10/29/16 1344  Weight: 138 lb (62.6 kg)  Height: 6' (1.829  m)    Gen: resting comfortably, no acute distress HEENT: no scleral icterus, pupils equal round and reactive, no palptable cervical adenopathy,  CV: RRR, no m/r/g no jvd Resp: Clear to auscultation bilaterally GI: abdomen is soft, non-tender, non-distended, normal bowel sounds, no hepatosplenomegaly MSK: extremities are warm, no edema.  Skin: warm, no rash Neuro:  no focal deficits Psych: appropriate affect   Diagnostic Studies 12/2012 Echo  LVEF 55-60%, mild MR, mod LVH,    CT TAA angiogram, 10/23/16: 1. Findings suggestive of type B intramural hematoma, complicated by Type B aortic dissection. There is aneurysmal dilation of the descending thoracic aorta measuring up to 6.2 cm in diameter with extensive peripheral thrombus. 2. There is focal linear intimal irregularity in the ascending thoracic aorta, which may represent an area of ulceration with adjacent thrombus or less likely, a separate focal dissection.  3. Postsurgical changes of prior infrarenal abdominal aortic aneurysm repair, with focal outpouching along the inferiormost aspect of the repair. While this may represent postsurgical anatomy, anastomotic leak cannot be excluded. Comparison with outside prior imaging  is recommended. 4. Focal penetrating ulcer of the suprarenal abdominal aorta measuring up to 1.5 cm in diameter. 5. Severe stenosis of the SMA and right renal artery at their origins. 6. Occlusion of the IMA with distal reconstitution.  7. Marked dilation of the duodenum, some component of which may be chronic and related to prior surgery, though early or partial obstruction could have a similar appearance. Correlation with prior imaging is recommended.  8. The NG tube is coiled in the midesophagus.     Assessment and Plan   1. CAD  - no current symptoms  - continue current meds  2. Carotid stenosis  - known occluded right ICA from prior studies  - no symptoms, continue current meds - we have elected not to continue surveillance imaging.   3. Bradycardia  - occasional orthostatic dizziness - we will continue to monitor  4. HTN  -elevated in clinic. Some of his bp meds were discontinued during recent admission - restart losartan 12.5mg  daily.   5. LE edema - continue compression stockings. Avoid diuretics due to orthostatic symptoms  6. Aortic dissection - Type B dissection diagnosed during recent admission. Patient and family elected for medical therapy only - continue blood pressure control. Low heart rates in the past, not started on beta blocker.    F/u 6 months     Arnoldo Lenis, M.D.

## 2016-11-07 ENCOUNTER — Emergency Department (HOSPITAL_COMMUNITY)
Admission: EM | Admit: 2016-11-07 | Discharge: 2016-11-07 | Disposition: A | Discharge status: Home / Self Care | Payer: Medicare Other | Attending: Emergency Medicine | Admitting: Emergency Medicine

## 2016-11-07 ENCOUNTER — Emergency Department (HOSPITAL_COMMUNITY): Payer: Medicare Other

## 2016-11-07 ENCOUNTER — Encounter (HOSPITAL_COMMUNITY): Payer: Self-pay | Admitting: Emergency Medicine

## 2016-11-07 DIAGNOSIS — I2510 Atherosclerotic heart disease of native coronary artery without angina pectoris: Secondary | ICD-10-CM | POA: Diagnosis not present

## 2016-11-07 DIAGNOSIS — E039 Hypothyroidism, unspecified: Secondary | ICD-10-CM | POA: Diagnosis not present

## 2016-11-07 DIAGNOSIS — Z79899 Other long term (current) drug therapy: Secondary | ICD-10-CM | POA: Diagnosis not present

## 2016-11-07 DIAGNOSIS — R079 Chest pain, unspecified: Secondary | ICD-10-CM | POA: Diagnosis not present

## 2016-11-07 DIAGNOSIS — Z85028 Personal history of other malignant neoplasm of stomach: Secondary | ICD-10-CM | POA: Insufficient documentation

## 2016-11-07 DIAGNOSIS — R918 Other nonspecific abnormal finding of lung field: Secondary | ICD-10-CM | POA: Diagnosis not present

## 2016-11-07 DIAGNOSIS — J449 Chronic obstructive pulmonary disease, unspecified: Secondary | ICD-10-CM | POA: Insufficient documentation

## 2016-11-07 DIAGNOSIS — Z87891 Personal history of nicotine dependence: Secondary | ICD-10-CM | POA: Insufficient documentation

## 2016-11-07 LAB — URINALYSIS, ROUTINE W REFLEX MICROSCOPIC
BILIRUBIN URINE: NEGATIVE
GLUCOSE, UA: NEGATIVE mg/dL
HGB URINE DIPSTICK: NEGATIVE
KETONES UR: NEGATIVE mg/dL
Leukocytes, UA: NEGATIVE
Nitrite: NEGATIVE
PH: 5 (ref 5.0–8.0)
PROTEIN: NEGATIVE mg/dL
Specific Gravity, Urine: 1.015 (ref 1.005–1.030)

## 2016-11-07 LAB — COMPREHENSIVE METABOLIC PANEL
ALBUMIN: 3.6 g/dL (ref 3.5–5.0)
ALK PHOS: 73 U/L (ref 38–126)
ALT: 10 U/L — ABNORMAL LOW (ref 17–63)
ANION GAP: 8 (ref 5–15)
AST: 13 U/L — ABNORMAL LOW (ref 15–41)
BILIRUBIN TOTAL: 0.6 mg/dL (ref 0.3–1.2)
BUN: 18 mg/dL (ref 6–20)
CALCIUM: 9.1 mg/dL (ref 8.9–10.3)
CO2: 27 mmol/L (ref 22–32)
Chloride: 101 mmol/L (ref 101–111)
Creatinine, Ser: 1.12 mg/dL (ref 0.61–1.24)
GFR calc non Af Amer: 55 mL/min — ABNORMAL LOW (ref >60)
GLUCOSE: 60 mg/dL — AB (ref 65–99)
POTASSIUM: 3.1 mmol/L — AB (ref 3.5–5.1)
Sodium: 136 mmol/L (ref 135–145)
TOTAL PROTEIN: 8.2 g/dL — AB (ref 6.5–8.1)

## 2016-11-07 LAB — CBC
HEMATOCRIT: 42.3 % (ref 39.0–52.0)
HEMOGLOBIN: 13.8 g/dL (ref 13.0–17.0)
MCH: 29.7 pg (ref 26.0–34.0)
MCHC: 32.6 g/dL (ref 30.0–36.0)
MCV: 91 fL (ref 78.0–100.0)
Platelets: 394 10*3/uL (ref 150–400)
RBC: 4.65 MIL/uL (ref 4.22–5.81)
RDW: 13.9 % (ref 11.5–15.5)
WBC: 10.1 10*3/uL (ref 4.0–10.5)

## 2016-11-07 LAB — TROPONIN I

## 2016-11-07 LAB — PROTIME-INR
INR: 0.98
PROTHROMBIN TIME: 13 s (ref 11.4–15.2)

## 2016-11-07 MED ORDER — ACETAMINOPHEN 325 MG PO TABS
650.0000 mg | ORAL_TABLET | Freq: Once | ORAL | Status: AC
  Administered 2016-11-07: 650 mg via ORAL
  Filled 2016-11-07: qty 2

## 2016-11-07 MED ORDER — NITROGLYCERIN 0.4 MG SL SUBL
0.4000 mg | SUBLINGUAL_TABLET | SUBLINGUAL | Status: DC | PRN
  Administered 2016-11-07: 0.4 mg via SUBLINGUAL
  Filled 2016-11-07: qty 1

## 2016-11-07 MED ORDER — ENSURE ACTIVE HEART HEALTH PO LIQD: 1.0000 | Freq: Two times a day (BID) | ORAL | 0 refills | Status: DC

## 2016-11-07 MED ORDER — NEPHROCAPS 1 MG PO CAPS: 1.0000 | ORAL_CAPSULE | Freq: Every day | ORAL | 0 refills | Status: DC

## 2016-11-07 NOTE — ED Provider Notes (Signed)
[facility] DEPT Provider Note   CSN: [id number] Arrival date & time: 11/07/16  1740     History   Chief Complaint Chief Complaint  Patient presents with  . Chest Pain    recent aortic dissection dx    HPI Luis Guerra is a 83 y.o. male.  Pt presents to the ED today with CP that started this morning.  The pt was admitted to [facility] from 2/24 to 2/28 with a type B aortic dissection.  The dissection was treated medically due to patient and family's wishes.  Pt was initially hypertensive and put on a nicardipine drip.  The pt was weaned off IV meds and transitioned to oral meds.  However, by the time of d/c, pt was hypotensive in the 100s, so he was taken off all bp meds.  It was decided to watch his bp as an outpatient and to start a beta blocker if sbp over 140.  Pt did f/u with his cardiologist Dr. Harl Bowie on 3/2.  His last cath was in 2008 which showed an occluded D2 which has been medically managed.  At that visit, he had not had any recent cp.  The pt had been on plavix (asa allergic) for his CAD, but was taken off that at d/c from [facility].           Past Medical History:  Diagnosis Date  . AAA (abdominal aortic aneurysm) ([place])   . Cancer ([place])    stomach cancer  . Carotid occlusion, right   . COPD (chronic obstructive pulmonary disease) ([place])   . Coronary artery disease    WITH HISTORY OF OCCLUSION OF A DIAGONAL BR[place]CH  . Dysphagia   . Gastritis   . Hiatal hernia   . Hypothyroidism   . SBO (small bowel obstruction)   . Weight loss     Patient Active Problem List   Diagnosis Date Noted  . Arthritis of knee 07/17/2013  . Weight loss 03/12/2013  . Sinus bradycardia 01/16/2013  . Lightheaded 01/14/2013  . Lightheadedness 01/13/2013  . Ataxia 01/13/2013  . Elevated transaminase level 01/13/2013  . History of gastric cancer 09/17/2012  . Abdominal pain 07/17/2012  . Knee pain 12/07/2011  . OA (osteoarthritis) of knee 12/07/2011  . Coronary artery disease   .  Carotid occlusion, right   . INTERDIGITAL NEUROMA 04/10/2009  . JOINT EFFUSION, LEFT KNEE 04/10/2009  . DEGENERATIVE JOINT DISEASE, RIGHT KNEE 02/05/2009  . OSTEOARTHRITIS, LOWER LEG 01/30/2009  . DERANGEMENT OF POSTERIOR HORN OF MEDIAL MENISCUS 01/30/2009  . BUCKET HANDLE TEAR OF LATERAL MENISCUS 01/30/2009  . DERANGEMENT MENISCUS 01/30/2009  . Pain in joint, lower leg 01/30/2009    Past Surgical History:  Procedure Laterality Date  . ABDOMINAL AORTIC ANEURYSM REPAIR    . BACK SURGERY    . BALLOON DILATION  05/26/2011   Procedure: BALLOON DILATION;  Surgeon: Rogene Houston, MD;  [facility];  Service: Endoscopy;  Laterality: N/A;  . CARDIAC CATHETERIZATION  06/08/2007   EF 55%  . CARDIOVASCULAR STRESS TEST  05/03/2006   EF 60%  . CHOLECYSTECTOMY    . COLONOSCOPY  07/22/2010  . ESOPHAGOGASTRODUODENOSCOPY  05/26/2011   Procedure: ESOPHAGOGASTRODUODENOSCOPY (EGD);  Surgeon: Rogene Houston, MD;  [facility];  Service: Endoscopy;  Laterality: N/A;  3:00  . ESOPHAGOGASTRODUODENOSCOPY  07/21/2012   Procedure: ESOPHAGOGASTRODUODENOSCOPY (EGD);  Surgeon: Rogene Houston, MD;  [facility];  Service: Endoscopy;  Laterality: N/A;  325-changed to 225 Ann to  notify pt  . HERNIA REPAIR    . low back surgery    . UPPER GASTROINTESTINAL ENDOSCOPY  07/13/06  . UPPER GASTROINTESTINAL ENDOSCOPY  02/19/05  . UPPER GASTROINTESTINAL ENDOSCOPY  07/22/2010   EGD ED       Home Medications    Prior to Admission medications   Medication Sig Start Date End Date Taking? Authorizing Provider  acetaminophen (TYLENOL) 500 MG tablet Take 1,000 mg by mouth 2 (two) times daily.    Yes Historical Provider, MD  fexofenadine (ALLEGRA) 180 MG tablet Take 180 mg by mouth daily. 09/04/15  Yes Historical Provider, MD  Fluticasone-Salmeterol (ADVAIR DISKUS) 250-50 MCG/DOSE AEPB Inhale 1 puff into the lungs every 12 (twelve) hours. Reported on 09/16/2015   Yes Historical Provider, MD    levothyroxine (SYNTHROID, LEVOTHROID) 100 MCG tablet Take 1 tablet by mouth daily. 08/16/16  Yes Historical Provider, MD  losartan (COZAAR) 25 MG tablet Take 0.5 tablets (12.5 mg total) by mouth daily. 10/29/16  Yes Arnoldo Lenis, MD  meclizine (ANTIVERT) 25 MG tablet Take 1 tablet (25 mg total) by mouth 2 (two) times daily as needed for dizziness. 10/14/15  Yes Nat Christen, MD  Multiple Vitamin (MULTIVITAMIN) tablet Take 1 tablet by mouth every evening.    Yes Historical Provider, MD  olopatadine (PATANOL) 0.1 % ophthalmic solution Place 1 drop into both eyes daily as needed for allergies.  10/13/15  Yes Historical Provider, MD  pantoprazole (PROTONIX) 40 MG tablet TAKE 1 TABLET BY MOUTH EVERY DAY 09/04/15  Yes Rogene Houston, MD  Probiotic Product (ALIGN) 4 MG CAPS Take 4 mg by mouth daily.    Yes Historical Provider, MD  senna-docusate (SENOKOT-S) 8.6-50 MG tablet Take 2 tablets by mouth 2 (two) times daily. 10/27/16 10/27/17 Yes Historical Provider, MD  b complex-C-folic acid 1 MG capsule Take 1 capsule (1 mg total) by mouth daily. 11/07/16   Isla Pence, MD  Nutritional Supplements ([place]) LIQD Take 1 Bottle by mouth 2 (two) times daily. 11/07/16   Isla Pence, MD    Family History Family History  Problem Relation Age of Onset  . Heart disease Father   . Healthy Sister   . Heart disease Sister   . Stroke Sister   . Stroke Daughter   . Diabetes Daughter   . Healthy Son     Social History Social History  Substance Use Topics  . Smoking status: Former Smoker    Quit date: 08/30/1957  . Smokeless tobacco: Never Used     Comment: Quit smoking in 1959  . Alcohol use No     Allergies   Aspirin; Esomeprazole magnesium; Niacin; and Penicillins   Review of Systems Review of Systems  Cardiovascular: Positive for chest pain.  All other systems reviewed and are negative.    Physical Exam Updated Vital Signs BP 141/73   Pulse (!) 58   Resp 15   Ht 6'  (1.829 m)   Wt 138 lb (62.6 kg)   SpO2 99%   BMI 18.72 kg/m   Physical Exam  Constitutional: He is oriented to person, place, and time. He appears well-developed and well-nourished.  HENT:  Head: Normocephalic and atraumatic.  Right Ear: External ear normal.  Left Ear: External ear normal.  Nose: Nose normal.  Mouth/Throat: Oropharynx is clear and moist.  Eyes: Conjunctivae and EOM are normal. Pupils are equal, round, and reactive to light.  Neck: Normal range of motion. Neck supple.  Cardiovascular: Normal rate, regular  rhythm, normal heart sounds and intact distal pulses.   Pulmonary/Chest: Effort normal and breath sounds normal.  Abdominal: Soft. Bowel sounds are normal.  Musculoskeletal: Normal range of motion.  Neurological: He is alert and oriented to person, place, and time.  Skin: Skin is warm.  Psychiatric: He has a normal mood and affect. His behavior is normal. Judgment and thought content normal.  Nursing note and vitals reviewed.    ED Treatments / Results  Labs (all labs ordered are listed, but only abnormal results are displayed) Labs Reviewed  COMPREHENSIVE METABOLIC PANEL - Abnormal; Notable for the following:       Result Value   Potassium 3.1 (*)    Glucose, Bld 60 (*)    Total Protein 8.2 (*)    AST 13 (*)    ALT 10 (*)    GFR calc non Af Amer 55 (*)    All other components within normal limits  CBC  PROTIME-INR  TROPONIN I  TROPONIN I  URINALYSIS, ROUTINE W REFLEX MICROSCOPIC  TROPONIN I    EKG  EKG Interpretation None       Radiology Dg Chest 2 View  Result Date: 11/07/2016 CLINICAL DATA:  83 year old male with chest and back pain for a week. Complicated type B aortic dissection with 6.2 cm aneurysmal enlargement of the descending thoracic aorta, diagnosed at [facility] by CTA on 10/23/2016. EXAM: CHEST  2 VIEW COMPARISON:  Report of [facility] CTA chest abdomen and pelvis 10/23/2016 (no images available). Chest radiographs  10/23/2015 and earlier FINDINGS: Upright AP and lateral views of the chest. Enlarged and tortuous contour of the descending thoracic aorta appears stable. Other mediastinal contours are within normal limits. Visualized tracheal air column is within normal limits. Stable lung volumes. No pneumothorax or pleural effusion. No acute osseous abnormality identified. Negative visible bowel gas pattern. IMPRESSION: 1. Radiographically stable contour of the abnormal descending thoracic aorta since 30/16/0109 (complicated type B aortic dissection with 6.2 cm aneurysmal enlargement of the descending thoracic aorta, diagnosed at by CTA on 10/23/2016). 2. No new cardiopulmonary abnormality. Electronically Signed   By: Genevie Ann M.D.   On: 11/07/2016 10:44    Procedures Procedures (including critical care time)  Medications Ordered in ED Medications  nitroGLYCERIN (NITROSTAT) SL tablet 0.4 mg (0.4 mg Sublingual Given 11/07/16 1139)  acetaminophen (TYLENOL) tablet 650 mg (650 mg Oral Given 11/07/16 1233)     Initial Impression / Assessment and Plan / ED Course  I have reviewed the triage vital signs and the nursing notes.  Pertinent labs & imaging results that were available during my care of the patient were reviewed by me and considered in my medical decision making (see chart for details).    Pt is feeling better.  His family said he has not been eating much.  They feel he may be depressed due to his medical issues.  They asked for rx of ensure and multivitamin.  This was done.  Pt offered admission due to hx of CAD and recent aneurysm, but he wants to go home.  Family agrees.  Final Clinical Impressions(s) / ED Diagnoses   Final diagnoses:  Chest pain, unspecified type    New Prescriptions New Prescriptions   B COMPLEX-C-FOLIC ACID 1 MG CAPSULE    Take 1 capsule (1 mg total) by mouth daily.   NUTRITIONAL SUPPLEMENTS ([place]) LIQD    Take 1 Bottle by mouth 2 (two) times daily.  Isla Pence, MD 11/07/16 [phone]

## 2016-11-07 NOTE — ED Triage Notes (Signed)
Pt reports having chest pain and back pain for about a week.  Was at [facility] 10/27/16 for SBO and aortic dissection.  Pt denies any cough or fever.

## 2016-12-06 DIAGNOSIS — E782 Mixed hyperlipidemia: Secondary | ICD-10-CM | POA: Diagnosis not present

## 2016-12-06 DIAGNOSIS — D509 Iron deficiency anemia, unspecified: Secondary | ICD-10-CM | POA: Diagnosis not present

## 2016-12-06 DIAGNOSIS — E039 Hypothyroidism, unspecified: Secondary | ICD-10-CM | POA: Diagnosis not present

## 2016-12-06 DIAGNOSIS — I10 Essential (primary) hypertension: Secondary | ICD-10-CM | POA: Diagnosis not present

## 2016-12-08 DIAGNOSIS — C329 Malignant neoplasm of larynx, unspecified: Secondary | ICD-10-CM | POA: Diagnosis not present

## 2016-12-08 DIAGNOSIS — E039 Hypothyroidism, unspecified: Secondary | ICD-10-CM | POA: Diagnosis not present

## 2016-12-08 DIAGNOSIS — Z681 Body mass index (BMI) 19 or less, adult: Secondary | ICD-10-CM | POA: Diagnosis not present

## 2016-12-08 DIAGNOSIS — I2510 Atherosclerotic heart disease of native coronary artery without angina pectoris: Secondary | ICD-10-CM | POA: Diagnosis not present

## 2016-12-08 DIAGNOSIS — D509 Iron deficiency anemia, unspecified: Secondary | ICD-10-CM | POA: Diagnosis not present

## 2017-03-15 DIAGNOSIS — I739 Peripheral vascular disease, unspecified: Secondary | ICD-10-CM | POA: Diagnosis not present

## 2017-03-15 DIAGNOSIS — B351 Tinea unguium: Secondary | ICD-10-CM | POA: Diagnosis not present

## 2017-03-15 DIAGNOSIS — L851 Acquired keratosis [keratoderma] palmaris et plantaris: Secondary | ICD-10-CM | POA: Diagnosis not present

## 2017-03-15 DIAGNOSIS — G629 Polyneuropathy, unspecified: Secondary | ICD-10-CM | POA: Diagnosis not present

## 2017-03-29 DIAGNOSIS — L97529 Non-pressure chronic ulcer of other part of left foot with unspecified severity: Secondary | ICD-10-CM | POA: Diagnosis not present

## 2017-04-12 DIAGNOSIS — R490 Dysphonia: Secondary | ICD-10-CM | POA: Diagnosis not present

## 2017-04-12 DIAGNOSIS — H6123 Impacted cerumen, bilateral: Secondary | ICD-10-CM | POA: Diagnosis not present

## 2017-04-12 DIAGNOSIS — C329 Malignant neoplasm of larynx, unspecified: Secondary | ICD-10-CM | POA: Diagnosis not present

## 2017-04-12 DIAGNOSIS — R911 Solitary pulmonary nodule: Secondary | ICD-10-CM | POA: Diagnosis not present

## 2017-05-04 DIAGNOSIS — C329 Malignant neoplasm of larynx, unspecified: Secondary | ICD-10-CM | POA: Diagnosis not present

## 2017-05-04 DIAGNOSIS — R911 Solitary pulmonary nodule: Secondary | ICD-10-CM | POA: Diagnosis not present

## 2017-05-27 DIAGNOSIS — I739 Peripheral vascular disease, unspecified: Secondary | ICD-10-CM | POA: Diagnosis not present

## 2017-05-27 DIAGNOSIS — B351 Tinea unguium: Secondary | ICD-10-CM | POA: Diagnosis not present

## 2017-05-27 DIAGNOSIS — L851 Acquired keratosis [keratoderma] palmaris et plantaris: Secondary | ICD-10-CM | POA: Diagnosis not present

## 2017-06-07 DIAGNOSIS — R911 Solitary pulmonary nodule: Secondary | ICD-10-CM | POA: Diagnosis not present

## 2017-06-07 DIAGNOSIS — C329 Malignant neoplasm of larynx, unspecified: Secondary | ICD-10-CM | POA: Diagnosis not present

## 2017-06-07 DIAGNOSIS — R490 Dysphonia: Secondary | ICD-10-CM | POA: Diagnosis not present

## 2017-06-08 DIAGNOSIS — E039 Hypothyroidism, unspecified: Secondary | ICD-10-CM | POA: Diagnosis not present

## 2017-06-08 DIAGNOSIS — E782 Mixed hyperlipidemia: Secondary | ICD-10-CM | POA: Diagnosis not present

## 2017-06-08 DIAGNOSIS — I10 Essential (primary) hypertension: Secondary | ICD-10-CM | POA: Diagnosis not present

## 2017-06-22 DIAGNOSIS — D509 Iron deficiency anemia, unspecified: Secondary | ICD-10-CM | POA: Diagnosis not present

## 2017-06-22 DIAGNOSIS — I10 Essential (primary) hypertension: Secondary | ICD-10-CM | POA: Diagnosis not present

## 2017-06-22 DIAGNOSIS — K219 Gastro-esophageal reflux disease without esophagitis: Secondary | ICD-10-CM | POA: Diagnosis not present

## 2017-06-22 DIAGNOSIS — I2510 Atherosclerotic heart disease of native coronary artery without angina pectoris: Secondary | ICD-10-CM | POA: Diagnosis not present

## 2017-06-22 DIAGNOSIS — E039 Hypothyroidism, unspecified: Secondary | ICD-10-CM | POA: Diagnosis not present

## 2017-06-22 DIAGNOSIS — C329 Malignant neoplasm of larynx, unspecified: Secondary | ICD-10-CM | POA: Diagnosis not present

## 2017-06-24 DIAGNOSIS — Z23 Encounter for immunization: Secondary | ICD-10-CM | POA: Diagnosis not present

## 2017-07-12 DIAGNOSIS — R410 Disorientation, unspecified: Secondary | ICD-10-CM | POA: Diagnosis not present

## 2017-08-03 DIAGNOSIS — A881 Epidemic vertigo: Secondary | ICD-10-CM | POA: Diagnosis not present

## 2017-08-03 DIAGNOSIS — I6529 Occlusion and stenosis of unspecified carotid artery: Secondary | ICD-10-CM | POA: Diagnosis not present

## 2017-08-03 DIAGNOSIS — E782 Mixed hyperlipidemia: Secondary | ICD-10-CM | POA: Diagnosis not present

## 2017-08-03 DIAGNOSIS — D509 Iron deficiency anemia, unspecified: Secondary | ICD-10-CM | POA: Diagnosis not present

## 2017-08-03 DIAGNOSIS — K219 Gastro-esophageal reflux disease without esophagitis: Secondary | ICD-10-CM | POA: Diagnosis not present

## 2017-08-03 DIAGNOSIS — I2510 Atherosclerotic heart disease of native coronary artery without angina pectoris: Secondary | ICD-10-CM | POA: Diagnosis not present

## 2017-08-03 DIAGNOSIS — E875 Hyperkalemia: Secondary | ICD-10-CM | POA: Diagnosis not present

## 2017-08-03 DIAGNOSIS — E46 Unspecified protein-calorie malnutrition: Secondary | ICD-10-CM | POA: Diagnosis not present

## 2017-08-03 DIAGNOSIS — R490 Dysphonia: Secondary | ICD-10-CM | POA: Diagnosis not present

## 2017-08-03 DIAGNOSIS — J449 Chronic obstructive pulmonary disease, unspecified: Secondary | ICD-10-CM | POA: Diagnosis not present

## 2017-08-03 DIAGNOSIS — E039 Hypothyroidism, unspecified: Secondary | ICD-10-CM | POA: Diagnosis not present

## 2017-08-03 DIAGNOSIS — E0590 Thyrotoxicosis, unspecified without thyrotoxic crisis or storm: Secondary | ICD-10-CM | POA: Diagnosis not present

## 2017-08-05 DIAGNOSIS — L851 Acquired keratosis [keratoderma] palmaris et plantaris: Secondary | ICD-10-CM | POA: Diagnosis not present

## 2017-08-05 DIAGNOSIS — B351 Tinea unguium: Secondary | ICD-10-CM | POA: Diagnosis not present

## 2017-08-05 DIAGNOSIS — I739 Peripheral vascular disease, unspecified: Secondary | ICD-10-CM | POA: Diagnosis not present

## 2017-09-13 DIAGNOSIS — J449 Chronic obstructive pulmonary disease, unspecified: Secondary | ICD-10-CM | POA: Diagnosis not present

## 2017-09-13 DIAGNOSIS — R42 Dizziness and giddiness: Secondary | ICD-10-CM | POA: Diagnosis not present

## 2017-09-13 DIAGNOSIS — I10 Essential (primary) hypertension: Secondary | ICD-10-CM | POA: Diagnosis not present

## 2017-09-13 DIAGNOSIS — R260 Ataxic gait: Secondary | ICD-10-CM | POA: Diagnosis not present

## 2017-09-20 DIAGNOSIS — J449 Chronic obstructive pulmonary disease, unspecified: Secondary | ICD-10-CM | POA: Diagnosis not present

## 2017-09-20 DIAGNOSIS — I10 Essential (primary) hypertension: Secondary | ICD-10-CM | POA: Diagnosis not present

## 2017-09-20 DIAGNOSIS — R42 Dizziness and giddiness: Secondary | ICD-10-CM | POA: Diagnosis not present

## 2017-09-20 DIAGNOSIS — R260 Ataxic gait: Secondary | ICD-10-CM | POA: Diagnosis not present

## 2017-09-22 DIAGNOSIS — I10 Essential (primary) hypertension: Secondary | ICD-10-CM | POA: Diagnosis not present

## 2017-09-22 DIAGNOSIS — J449 Chronic obstructive pulmonary disease, unspecified: Secondary | ICD-10-CM | POA: Diagnosis not present

## 2017-09-22 DIAGNOSIS — R260 Ataxic gait: Secondary | ICD-10-CM | POA: Diagnosis not present

## 2017-09-22 DIAGNOSIS — R42 Dizziness and giddiness: Secondary | ICD-10-CM | POA: Diagnosis not present

## 2017-09-26 DIAGNOSIS — I10 Essential (primary) hypertension: Secondary | ICD-10-CM | POA: Diagnosis not present

## 2017-09-26 DIAGNOSIS — R42 Dizziness and giddiness: Secondary | ICD-10-CM | POA: Diagnosis not present

## 2017-09-26 DIAGNOSIS — J449 Chronic obstructive pulmonary disease, unspecified: Secondary | ICD-10-CM | POA: Diagnosis not present

## 2017-09-26 DIAGNOSIS — R260 Ataxic gait: Secondary | ICD-10-CM | POA: Diagnosis not present

## 2017-09-28 DIAGNOSIS — J449 Chronic obstructive pulmonary disease, unspecified: Secondary | ICD-10-CM | POA: Diagnosis not present

## 2017-09-28 DIAGNOSIS — R42 Dizziness and giddiness: Secondary | ICD-10-CM | POA: Diagnosis not present

## 2017-09-28 DIAGNOSIS — R260 Ataxic gait: Secondary | ICD-10-CM | POA: Diagnosis not present

## 2017-09-28 DIAGNOSIS — I10 Essential (primary) hypertension: Secondary | ICD-10-CM | POA: Diagnosis not present

## 2017-10-06 DIAGNOSIS — J449 Chronic obstructive pulmonary disease, unspecified: Secondary | ICD-10-CM | POA: Diagnosis not present

## 2017-10-06 DIAGNOSIS — R260 Ataxic gait: Secondary | ICD-10-CM | POA: Diagnosis not present

## 2017-10-06 DIAGNOSIS — I10 Essential (primary) hypertension: Secondary | ICD-10-CM | POA: Diagnosis not present

## 2017-10-06 DIAGNOSIS — R42 Dizziness and giddiness: Secondary | ICD-10-CM | POA: Diagnosis not present

## 2017-10-07 DIAGNOSIS — R42 Dizziness and giddiness: Secondary | ICD-10-CM | POA: Diagnosis not present

## 2017-10-07 DIAGNOSIS — I10 Essential (primary) hypertension: Secondary | ICD-10-CM | POA: Diagnosis not present

## 2017-10-07 DIAGNOSIS — R260 Ataxic gait: Secondary | ICD-10-CM | POA: Diagnosis not present

## 2017-10-07 DIAGNOSIS — J449 Chronic obstructive pulmonary disease, unspecified: Secondary | ICD-10-CM | POA: Diagnosis not present

## 2017-10-10 DIAGNOSIS — J449 Chronic obstructive pulmonary disease, unspecified: Secondary | ICD-10-CM | POA: Diagnosis not present

## 2017-10-10 DIAGNOSIS — R260 Ataxic gait: Secondary | ICD-10-CM | POA: Diagnosis not present

## 2017-10-10 DIAGNOSIS — R42 Dizziness and giddiness: Secondary | ICD-10-CM | POA: Diagnosis not present

## 2017-10-10 DIAGNOSIS — I10 Essential (primary) hypertension: Secondary | ICD-10-CM | POA: Diagnosis not present

## 2017-10-12 DIAGNOSIS — R531 Weakness: Secondary | ICD-10-CM | POA: Diagnosis not present

## 2017-10-12 DIAGNOSIS — R600 Localized edema: Secondary | ICD-10-CM | POA: Diagnosis not present

## 2017-10-13 DIAGNOSIS — R260 Ataxic gait: Secondary | ICD-10-CM | POA: Diagnosis not present

## 2017-10-13 DIAGNOSIS — J449 Chronic obstructive pulmonary disease, unspecified: Secondary | ICD-10-CM | POA: Diagnosis not present

## 2017-10-13 DIAGNOSIS — I10 Essential (primary) hypertension: Secondary | ICD-10-CM | POA: Diagnosis not present

## 2017-10-13 DIAGNOSIS — R42 Dizziness and giddiness: Secondary | ICD-10-CM | POA: Diagnosis not present

## 2017-10-18 ENCOUNTER — Encounter (HOSPITAL_COMMUNITY): Payer: Self-pay | Admitting: Emergency Medicine

## 2017-10-18 ENCOUNTER — Emergency Department (HOSPITAL_COMMUNITY)
Admission: EM | Admit: 2017-10-18 | Discharge: 2017-10-19 | Disposition: A | Discharge status: Home / Self Care | Payer: Medicare Other | Attending: Emergency Medicine | Admitting: Emergency Medicine

## 2017-10-18 ENCOUNTER — Other Ambulatory Visit: Payer: Self-pay

## 2017-10-18 DIAGNOSIS — M79674 Pain in right toe(s): Secondary | ICD-10-CM | POA: Diagnosis not present

## 2017-10-18 DIAGNOSIS — Z87891 Personal history of nicotine dependence: Secondary | ICD-10-CM | POA: Diagnosis not present

## 2017-10-18 DIAGNOSIS — S51012A Laceration without foreign body of left elbow, initial encounter: Secondary | ICD-10-CM | POA: Insufficient documentation

## 2017-10-18 DIAGNOSIS — E039 Hypothyroidism, unspecified: Secondary | ICD-10-CM | POA: Diagnosis not present

## 2017-10-18 DIAGNOSIS — Y998 Other external cause status: Secondary | ICD-10-CM | POA: Insufficient documentation

## 2017-10-18 DIAGNOSIS — Z79899 Other long term (current) drug therapy: Secondary | ICD-10-CM | POA: Insufficient documentation

## 2017-10-18 DIAGNOSIS — D649 Anemia, unspecified: Secondary | ICD-10-CM | POA: Insufficient documentation

## 2017-10-18 DIAGNOSIS — J449 Chronic obstructive pulmonary disease, unspecified: Secondary | ICD-10-CM | POA: Insufficient documentation

## 2017-10-18 DIAGNOSIS — I10 Essential (primary) hypertension: Secondary | ICD-10-CM | POA: Insufficient documentation

## 2017-10-18 DIAGNOSIS — Z85028 Personal history of other malignant neoplasm of stomach: Secondary | ICD-10-CM | POA: Insufficient documentation

## 2017-10-18 DIAGNOSIS — M79675 Pain in left toe(s): Secondary | ICD-10-CM | POA: Diagnosis not present

## 2017-10-18 DIAGNOSIS — B351 Tinea unguium: Secondary | ICD-10-CM | POA: Diagnosis not present

## 2017-10-18 DIAGNOSIS — Z23 Encounter for immunization: Secondary | ICD-10-CM | POA: Insufficient documentation

## 2017-10-18 DIAGNOSIS — Y92001 Dining room of unspecified non-institutional (private) residence as the place of occurrence of the external cause: Secondary | ICD-10-CM | POA: Insufficient documentation

## 2017-10-18 DIAGNOSIS — Y9389 Activity, other specified: Secondary | ICD-10-CM | POA: Diagnosis not present

## 2017-10-18 DIAGNOSIS — M79602 Pain in left arm: Secondary | ICD-10-CM | POA: Diagnosis not present

## 2017-10-18 DIAGNOSIS — S41112A Laceration without foreign body of left upper arm, initial encounter: Secondary | ICD-10-CM | POA: Diagnosis not present

## 2017-10-18 DIAGNOSIS — T148XXA Other injury of unspecified body region, initial encounter: Secondary | ICD-10-CM | POA: Diagnosis not present

## 2017-10-18 DIAGNOSIS — W01190A Fall on same level from slipping, tripping and stumbling with subsequent striking against furniture, initial encounter: Secondary | ICD-10-CM | POA: Insufficient documentation

## 2017-10-18 DIAGNOSIS — W19XXXA Unspecified fall, initial encounter: Secondary | ICD-10-CM

## 2017-10-18 LAB — BASIC METABOLIC PANEL
Anion gap: 9 (ref 5–15)
BUN: 21 mg/dL — AB (ref 6–20)
CHLORIDE: 107 mmol/L (ref 101–111)
CO2: 21 mmol/L — AB (ref 22–32)
CREATININE: 1.08 mg/dL (ref 0.61–1.24)
Calcium: 8.2 mg/dL — ABNORMAL LOW (ref 8.9–10.3)
GFR calc Af Amer: >60 mL/min (ref >60)
GFR calc non Af Amer: 57 mL/min — ABNORMAL LOW (ref >60)
Glucose, Bld: 103 mg/dL — ABNORMAL HIGH (ref 65–99)
Potassium: 3.9 mmol/L (ref 3.5–5.1)
SODIUM: 137 mmol/L (ref 135–145)

## 2017-10-18 LAB — CBC
HCT: 34.4 % — ABNORMAL LOW (ref 39.0–52.0)
HEMOGLOBIN: 10.8 g/dL — AB (ref 13.0–17.0)
MCH: 28.8 pg (ref 26.0–34.0)
MCHC: 31.4 g/dL (ref 30.0–36.0)
MCV: 91.7 fL (ref 78.0–100.0)
Platelets: 181 10*3/uL (ref 150–400)
RBC: 3.75 MIL/uL — ABNORMAL LOW (ref 4.22–5.81)
RDW: 14.7 % (ref 11.5–15.5)
WBC: 5.6 10*3/uL (ref 4.0–10.5)

## 2017-10-18 MED ORDER — TETANUS-DIPHTH-ACELL PERTUSSIS 5-2.5-18.5 LF-MCG/0.5 IM SUSP
0.5000 mL | Freq: Once | INTRAMUSCULAR | Status: AC
  Administered 2017-10-18: 0.5 mL via INTRAMUSCULAR
  Filled 2017-10-18: qty 0.5

## 2017-10-18 NOTE — ED Triage Notes (Signed)
Pt slipped and fell, has skin tear to rt upper arm with no active bleeding.  Pt remembers fall and states he did not hit his head.  Pt is alert and orientated.  Pt is from [facility].

## 2017-10-18 NOTE — Discharge Instructions (Signed)
Try to keep a bandage over the wound, change the dressing daily, follow-up with your primary care doctor next week to check on your blood pressure and to recheck your blood count.  Return as needed for any recurrent or worsening symptoms

## 2017-10-18 NOTE — ED Notes (Signed)
Have talked with nurse at [facility] of pt.'s condition. They will send someone to pick up pt

## 2017-10-18 NOTE — ED Provider Notes (Signed)
[facility] EMERGENCY DEPARTMENT Provider Note   CSN: [id number] Arrival date & time: 10/18/17  1836     History   Chief Complaint Chief Complaint  Patient presents with  . Fall    HPI Luis Guerra is a 84 y.o. male.  HPI Patient presents to the emergency room for evaluation of skin tear on his left arm.  Patient states he was sitting at the dinner table.  He went down to pick up a knife that had fallen on the floor when his foot caught on the leg of the table.  Patient lost his balance then started falling.  Patient denies hitting his head.  He did develop a skin tear in his left upper arm but otherwise he denies any other injuries.  He is not have any trouble with chest pain or shortness of breath.  No abdominal pain. Past Medical History:  Diagnosis Date  . AAA (abdominal aortic aneurysm) ([place])   . Cancer ([place])    stomach cancer  . Carotid occlusion, right   . COPD (chronic obstructive pulmonary disease) ([place])   . Coronary artery disease    WITH HISTORY OF OCCLUSION OF A DIAGONAL BRANCH  . Dysphagia   . Gastritis   . Hiatal hernia   . Hypothyroidism   . SBO (small bowel obstruction) ([place])   . Weight loss     Patient Active Problem List   Diagnosis Date Noted  . Arthritis of knee 07/17/2013  . Weight loss 03/12/2013  . Sinus bradycardia 01/16/2013  . Lightheaded 01/14/2013  . Lightheadedness 01/13/2013  . Ataxia 01/13/2013  . Elevated transaminase level 01/13/2013  . History of gastric cancer 09/17/2012  . Abdominal pain 07/17/2012  . Knee pain 12/07/2011  . OA (osteoarthritis) of knee 12/07/2011  . Coronary artery disease   . Carotid occlusion, right   . INTERDIGITAL NEUROMA 04/10/2009  . JOINT EFFUSION, LEFT KNEE 04/10/2009  . DEGENERATIVE JOINT DISEASE, RIGHT KNEE 02/05/2009  . OSTEOARTHRITIS, LOWER LEG 01/30/2009  . DERANGEMENT OF POSTERIOR HORN OF MEDIAL MENISCUS 01/30/2009  . BUCKET HANDLE TEAR OF LATERAL MENISCUS 01/30/2009  . DERANGEMENT  MENISCUS 01/30/2009  . Pain in joint, lower leg 01/30/2009    Past Surgical History:  Procedure Laterality Date  . ABDOMINAL AORTIC ANEURYSM REPAIR    . BACK SURGERY    . BALLOON DILATION  05/26/2011   Procedure: BALLOON DILATION;  Surgeon: Rogene Houston, MD;  [facility];  Service: Endoscopy;  Laterality: N/A;  . CARDIAC CATHETERIZATION  06/08/2007   EF 55%  . CARDIOVASCULAR STRESS TEST  05/03/2006   EF 60%  . CHOLECYSTECTOMY    . COLONOSCOPY  07/22/2010  . ESOPHAGOGASTRODUODENOSCOPY  05/26/2011   Procedure: ESOPHAGOGASTRODUODENOSCOPY (EGD);  Surgeon: Rogene Houston, MD;  [facility];  Service: Endoscopy;  Laterality: N/A;  3:00  . ESOPHAGOGASTRODUODENOSCOPY  07/21/2012   Procedure: ESOPHAGOGASTRODUODENOSCOPY (EGD);  Surgeon: Rogene Houston, MD;  Location[facility];  Service: Endoscopy;  Laterality: N/A;  325-changed to 225 Ann to notify pt  . HERNIA REPAIR    . low back surgery    . UPPER GASTROINTESTINAL ENDOSCOPY  07/13/06  . UPPER GASTROINTESTINAL ENDOSCOPY  02/19/05  . UPPER GASTROINTESTINAL ENDOSCOPY  07/22/2010   EGD ED       Home Medications    Prior to Admission medications   Medication Sig Start Date End Date Taking? Authorizing Provider  acetaminophen (TYLENOL) 500 MG tablet Take 1,000 mg by mouth 2 (two) times  daily.    Yes [provider]  fexofenadine (ALLEGRA) 180 MG tablet Take 180 mg by mouth daily. 09/04/15  Yes [provider]  Fluticasone-Salmeterol (ADVAIR DISKUS) 250-50 MCG/DOSE AEPB Inhale 1 puff into the lungs every 12 (twelve) hours. Reported on 09/16/2015   Yes [provider]  levothyroxine (SYNTHROID, LEVOTHROID) 100 MCG tablet Take 1 tablet by mouth daily. 08/16/16  Yes [provider]  losartan (COZAAR) 25 MG tablet Take 0.5 tablets (12.5 mg total) by mouth daily. Patient taking differently: Take 25 mg by mouth daily.  10/29/16  Yes BranchAlphonse Guild, MD  olopatadine (PATANOL) 0.1 %  ophthalmic solution Place 1 drop into both eyes 2 (two) times daily.  10/13/15  Yes [provider]  pantoprazole (PROTONIX) 40 MG tablet TAKE 1 TABLET BY MOUTH EVERY DAY 09/04/15  Yes Rehman, Mechele Dawley, MD    Family History Family History  Problem Relation Age of Onset  . Heart disease Father   . Healthy Sister   . Heart disease Sister   . Stroke Sister   . Stroke Daughter   . Diabetes Daughter   . Healthy Son     Social History Social History   Tobacco Use  . Smoking status: Former Smoker    Last attempt to quit: 08/30/1957    Years since quitting: 60.1  . Smokeless tobacco: Never Used  . Tobacco comment: Quit smoking in 1959  Substance Use Topics  . Alcohol use: No    Alcohol/week: 0.0 oz  . Drug use: No     Allergies   Aspirin; Esomeprazole magnesium; Niacin; and Penicillins   Review of Systems Review of Systems  Constitutional: Negative for fever.  Respiratory: Negative for chest tightness.   Cardiovascular: Negative for chest pain.  Gastrointestinal: Negative for blood in stool.  Genitourinary: Negative for dysuria.  Neurological: Negative for syncope and headaches.  All other systems reviewed and are negative.    Physical Exam Updated Vital Signs BP (!) 187/91 (BP Location: Right Arm) Comment: pt having tremors  Pulse 72   Temp 98 F (36.7 C) (Oral)   Resp 19   Ht 1.829 m (6')   Wt 68 kg (150 lb)   SpO2 100%   BMI 20.34 kg/m   Physical Exam  Constitutional: He appears well-developed and well-nourished. No distress.  HENT:  Head: Normocephalic and atraumatic.  Right Ear: External ear normal.  Left Ear: External ear normal.  Eyes: Conjunctivae are normal. Right eye exhibits no discharge. Left eye exhibits no discharge. No scleral icterus.  Neck: Neck supple. No tracheal deviation present.  Cardiovascular: Normal rate, regular rhythm and intact distal pulses.  Pulmonary/Chest: Effort normal and breath sounds normal. No stridor. No  respiratory distress. He has no wheezes. He has no rales.  Abdominal: Soft. Bowel sounds are normal. He exhibits no distension. There is no tenderness. There is no rebound and no guarding.  Musculoskeletal: He exhibits no edema or tenderness.       Right shoulder: He exhibits no tenderness, no bony tenderness and no swelling.       Left shoulder: He exhibits no tenderness, no bony tenderness and no swelling.       Right wrist: He exhibits no tenderness, no bony tenderness and no swelling.       Left wrist: He exhibits no tenderness, no bony tenderness and no swelling.       Right hip: He exhibits normal range of motion, no tenderness, no bony tenderness and no swelling.  Left hip: He exhibits normal range of motion, no tenderness and no bony tenderness.       Right ankle: He exhibits no swelling. No tenderness.       Left ankle: He exhibits no swelling. No tenderness.       Cervical back: He exhibits no tenderness, no bony tenderness and no swelling.       Thoracic back: He exhibits no tenderness, no bony tenderness and no swelling.       Lumbar back: He exhibits no tenderness, no bony tenderness and no swelling.  Superficial skin tear left upper extremity, skin flap retracted  Neurological: He is alert. He has normal strength. No cranial nerve deficit (no facial droop, extraocular movements intact, no slurred speech) or sensory deficit. He exhibits normal muscle tone. He displays no seizure activity. Coordination normal.  Skin: Skin is warm and dry. No rash noted.  Psychiatric: He has a normal mood and affect.  Nursing note and vitals reviewed.    ED Treatments / Results  Labs (all labs ordered are listed, but only abnormal results are displayed) Labs Reviewed  CBC - Abnormal; Notable for the following components:      Result Value   RBC 3.75 (*)    Hemoglobin 10.8 (*)    HCT 34.4 (*)    All other components within normal limits  BASIC METABOLIC PANEL - Abnormal; Notable for the  following components:   CO2 21 (*)    Glucose, Bld 103 (*)    BUN 21 (*)    Calcium 8.2 (*)    GFR calc non Af Amer 57 (*)    All other components within normal limits    EKG  EKG Interpretation None       Radiology No results found.  Procedures .Marland KitchenLaceration Repair Date/Time: 10/18/2017 8:49 PM Performed by: Dorie Rank, MD Authorized by: Dorie Rank, MD   Consent:    Consent obtained:  Verbal   Consent given by:  Patient   Risks discussed:  Infection, need for additional repair, pain, poor cosmetic result and poor wound healing   Alternatives discussed:  No treatment and delayed treatment Universal protocol:    Procedure explained and questions answered to patient or proxy's satisfaction: yes     Relevant documents present and verified: yes     Test results available and properly labeled: yes     Imaging studies available: yes     Required blood products, implants, devices, and special equipment available: yes     Site/side marked: yes     Immediately prior to procedure, a time out was called: yes     Patient identity confirmed:  Verbally with patient Anesthesia (see MAR for exact dosages):    Anesthesia method:  None Laceration details:    Location:  Shoulder/arm   Shoulder/arm location:  L upper arm   Length (cm):  15 Treatment:    Area cleansed with:  Shur-Clens   Amount of cleaning:  Standard   Visualized foreign bodies/material removed: no   Skin repair:    Repair method:  Tissue adhesive Approximation:    Approximation:  Loose Comments:     Superficial skin tear.  Skin flap re approximated and tacked down with dermabond   (including critical care time)  Medications Ordered in ED Medications  Tdap (BOOSTRIX) injection 0.5 mL (0.5 mLs Intramuscular Given 10/18/17 2003)     Initial Impression / Assessment and Plan / ED Course  I have reviewed the triage vital signs and  the nursing notes.  Pertinent labs & imaging results that were available during my  care of the patient were reviewed by me and considered in my medical decision making (see chart for details).   Patient presented to the emergency room after mechanical fall.  He had a superficial skin tear in his left upper extremity.  I was able to reapproximate the skin edges.  Patient's laboratory tests are notable for new mild anemia but is not having any evidence of bleeding.  Patient denies any other complaints.  Plan follow-up with his primary care doctor to have his hemoglobin rechecked in the next week or so to have his blood pressure rechecked.  He appears stable to be discharged back to his nursing facility.  Final Clinical Impressions(s) / ED Diagnoses   Final diagnoses:  Fall, initial encounter  Skin tear of left elbow without complication, initial encounter  Anemia, unspecified type  Hypertension, unspecified type    ED Discharge Orders    None       Dorie Rank, MD 10/18/17 2112

## 2017-10-21 DIAGNOSIS — I6529 Occlusion and stenosis of unspecified carotid artery: Secondary | ICD-10-CM | POA: Diagnosis not present

## 2017-10-21 DIAGNOSIS — J449 Chronic obstructive pulmonary disease, unspecified: Secondary | ICD-10-CM | POA: Diagnosis not present

## 2017-10-21 DIAGNOSIS — I10 Essential (primary) hypertension: Secondary | ICD-10-CM | POA: Diagnosis not present

## 2017-10-21 DIAGNOSIS — Z9181 History of falling: Secondary | ICD-10-CM | POA: Diagnosis not present

## 2017-10-21 DIAGNOSIS — D509 Iron deficiency anemia, unspecified: Secondary | ICD-10-CM | POA: Diagnosis not present

## 2017-10-21 DIAGNOSIS — M6281 Muscle weakness (generalized): Secondary | ICD-10-CM | POA: Diagnosis not present

## 2017-10-21 DIAGNOSIS — E46 Unspecified protein-calorie malnutrition: Secondary | ICD-10-CM | POA: Diagnosis not present

## 2017-10-21 DIAGNOSIS — R490 Dysphonia: Secondary | ICD-10-CM | POA: Diagnosis not present

## 2017-10-21 DIAGNOSIS — A881 Epidemic vertigo: Secondary | ICD-10-CM | POA: Diagnosis not present

## 2017-10-21 DIAGNOSIS — R2689 Other abnormalities of gait and mobility: Secondary | ICD-10-CM | POA: Diagnosis not present

## 2017-10-21 DIAGNOSIS — I2510 Atherosclerotic heart disease of native coronary artery without angina pectoris: Secondary | ICD-10-CM | POA: Diagnosis not present

## 2017-10-21 DIAGNOSIS — S40812D Abrasion of left upper arm, subsequent encounter: Secondary | ICD-10-CM | POA: Diagnosis not present

## 2017-10-21 DIAGNOSIS — K219 Gastro-esophageal reflux disease without esophagitis: Secondary | ICD-10-CM | POA: Diagnosis not present

## 2017-10-24 DIAGNOSIS — G629 Polyneuropathy, unspecified: Secondary | ICD-10-CM | POA: Diagnosis not present

## 2017-10-24 DIAGNOSIS — B351 Tinea unguium: Secondary | ICD-10-CM | POA: Diagnosis not present

## 2017-10-24 DIAGNOSIS — L851 Acquired keratosis [keratoderma] palmaris et plantaris: Secondary | ICD-10-CM | POA: Diagnosis not present

## 2017-10-24 DIAGNOSIS — I739 Peripheral vascular disease, unspecified: Secondary | ICD-10-CM | POA: Diagnosis not present

## 2017-10-25 DIAGNOSIS — M6281 Muscle weakness (generalized): Secondary | ICD-10-CM | POA: Diagnosis not present

## 2017-10-25 DIAGNOSIS — S40812D Abrasion of left upper arm, subsequent encounter: Secondary | ICD-10-CM | POA: Diagnosis not present

## 2017-10-25 DIAGNOSIS — R2689 Other abnormalities of gait and mobility: Secondary | ICD-10-CM | POA: Diagnosis not present

## 2017-10-25 DIAGNOSIS — I2510 Atherosclerotic heart disease of native coronary artery without angina pectoris: Secondary | ICD-10-CM | POA: Diagnosis not present

## 2017-10-25 DIAGNOSIS — D509 Iron deficiency anemia, unspecified: Secondary | ICD-10-CM | POA: Diagnosis not present

## 2017-10-25 DIAGNOSIS — I10 Essential (primary) hypertension: Secondary | ICD-10-CM | POA: Diagnosis not present

## 2017-10-25 DIAGNOSIS — J449 Chronic obstructive pulmonary disease, unspecified: Secondary | ICD-10-CM | POA: Diagnosis not present

## 2017-10-25 DIAGNOSIS — Z9181 History of falling: Secondary | ICD-10-CM | POA: Diagnosis not present

## 2017-10-27 DIAGNOSIS — R2689 Other abnormalities of gait and mobility: Secondary | ICD-10-CM | POA: Diagnosis not present

## 2017-10-27 DIAGNOSIS — I2510 Atherosclerotic heart disease of native coronary artery without angina pectoris: Secondary | ICD-10-CM | POA: Diagnosis not present

## 2017-10-27 DIAGNOSIS — M6281 Muscle weakness (generalized): Secondary | ICD-10-CM | POA: Diagnosis not present

## 2017-10-27 DIAGNOSIS — I10 Essential (primary) hypertension: Secondary | ICD-10-CM | POA: Diagnosis not present

## 2017-10-27 DIAGNOSIS — J449 Chronic obstructive pulmonary disease, unspecified: Secondary | ICD-10-CM | POA: Diagnosis not present

## 2017-10-27 DIAGNOSIS — S40812D Abrasion of left upper arm, subsequent encounter: Secondary | ICD-10-CM | POA: Diagnosis not present

## 2017-10-28 DIAGNOSIS — S40812D Abrasion of left upper arm, subsequent encounter: Secondary | ICD-10-CM | POA: Diagnosis not present

## 2017-10-28 DIAGNOSIS — I10 Essential (primary) hypertension: Secondary | ICD-10-CM | POA: Diagnosis not present

## 2017-10-28 DIAGNOSIS — J449 Chronic obstructive pulmonary disease, unspecified: Secondary | ICD-10-CM | POA: Diagnosis not present

## 2017-10-28 DIAGNOSIS — M6281 Muscle weakness (generalized): Secondary | ICD-10-CM | POA: Diagnosis not present

## 2017-10-28 DIAGNOSIS — I2510 Atherosclerotic heart disease of native coronary artery without angina pectoris: Secondary | ICD-10-CM | POA: Diagnosis not present

## 2017-10-28 DIAGNOSIS — R2689 Other abnormalities of gait and mobility: Secondary | ICD-10-CM | POA: Diagnosis not present

## 2017-10-31 DIAGNOSIS — S40812D Abrasion of left upper arm, subsequent encounter: Secondary | ICD-10-CM | POA: Diagnosis not present

## 2017-10-31 DIAGNOSIS — R2689 Other abnormalities of gait and mobility: Secondary | ICD-10-CM | POA: Diagnosis not present

## 2017-10-31 DIAGNOSIS — J449 Chronic obstructive pulmonary disease, unspecified: Secondary | ICD-10-CM | POA: Diagnosis not present

## 2017-10-31 DIAGNOSIS — I10 Essential (primary) hypertension: Secondary | ICD-10-CM | POA: Diagnosis not present

## 2017-10-31 DIAGNOSIS — M6281 Muscle weakness (generalized): Secondary | ICD-10-CM | POA: Diagnosis not present

## 2017-10-31 DIAGNOSIS — I2510 Atherosclerotic heart disease of native coronary artery without angina pectoris: Secondary | ICD-10-CM | POA: Diagnosis not present

## 2017-11-01 DIAGNOSIS — M6281 Muscle weakness (generalized): Secondary | ICD-10-CM | POA: Diagnosis not present

## 2017-11-01 DIAGNOSIS — I10 Essential (primary) hypertension: Secondary | ICD-10-CM | POA: Diagnosis not present

## 2017-11-01 DIAGNOSIS — S40812D Abrasion of left upper arm, subsequent encounter: Secondary | ICD-10-CM | POA: Diagnosis not present

## 2017-11-01 DIAGNOSIS — J449 Chronic obstructive pulmonary disease, unspecified: Secondary | ICD-10-CM | POA: Diagnosis not present

## 2017-11-01 DIAGNOSIS — I2510 Atherosclerotic heart disease of native coronary artery without angina pectoris: Secondary | ICD-10-CM | POA: Diagnosis not present

## 2017-11-01 DIAGNOSIS — R2689 Other abnormalities of gait and mobility: Secondary | ICD-10-CM | POA: Diagnosis not present

## 2017-11-02 DIAGNOSIS — I10 Essential (primary) hypertension: Secondary | ICD-10-CM | POA: Diagnosis not present

## 2017-11-02 DIAGNOSIS — M6281 Muscle weakness (generalized): Secondary | ICD-10-CM | POA: Diagnosis not present

## 2017-11-02 DIAGNOSIS — I2510 Atherosclerotic heart disease of native coronary artery without angina pectoris: Secondary | ICD-10-CM | POA: Diagnosis not present

## 2017-11-02 DIAGNOSIS — J449 Chronic obstructive pulmonary disease, unspecified: Secondary | ICD-10-CM | POA: Diagnosis not present

## 2017-11-02 DIAGNOSIS — R2689 Other abnormalities of gait and mobility: Secondary | ICD-10-CM | POA: Diagnosis not present

## 2017-11-02 DIAGNOSIS — S40812D Abrasion of left upper arm, subsequent encounter: Secondary | ICD-10-CM | POA: Diagnosis not present

## 2017-11-07 DIAGNOSIS — S40812D Abrasion of left upper arm, subsequent encounter: Secondary | ICD-10-CM | POA: Diagnosis not present

## 2017-11-07 DIAGNOSIS — I2510 Atherosclerotic heart disease of native coronary artery without angina pectoris: Secondary | ICD-10-CM | POA: Diagnosis not present

## 2017-11-07 DIAGNOSIS — I10 Essential (primary) hypertension: Secondary | ICD-10-CM | POA: Diagnosis not present

## 2017-11-07 DIAGNOSIS — M6281 Muscle weakness (generalized): Secondary | ICD-10-CM | POA: Diagnosis not present

## 2017-11-07 DIAGNOSIS — R2689 Other abnormalities of gait and mobility: Secondary | ICD-10-CM | POA: Diagnosis not present

## 2017-11-07 DIAGNOSIS — J449 Chronic obstructive pulmonary disease, unspecified: Secondary | ICD-10-CM | POA: Diagnosis not present

## 2017-11-09 DIAGNOSIS — J060 Acute laryngopharyngitis: Secondary | ICD-10-CM | POA: Diagnosis not present

## 2017-11-10 DIAGNOSIS — I10 Essential (primary) hypertension: Secondary | ICD-10-CM | POA: Diagnosis not present

## 2017-11-10 DIAGNOSIS — J449 Chronic obstructive pulmonary disease, unspecified: Secondary | ICD-10-CM | POA: Diagnosis not present

## 2017-11-10 DIAGNOSIS — I2510 Atherosclerotic heart disease of native coronary artery without angina pectoris: Secondary | ICD-10-CM | POA: Diagnosis not present

## 2017-11-10 DIAGNOSIS — M6281 Muscle weakness (generalized): Secondary | ICD-10-CM | POA: Diagnosis not present

## 2017-11-10 DIAGNOSIS — S40812D Abrasion of left upper arm, subsequent encounter: Secondary | ICD-10-CM | POA: Diagnosis not present

## 2017-11-10 DIAGNOSIS — R2689 Other abnormalities of gait and mobility: Secondary | ICD-10-CM | POA: Diagnosis not present

## 2017-11-14 DIAGNOSIS — R2689 Other abnormalities of gait and mobility: Secondary | ICD-10-CM | POA: Diagnosis not present

## 2017-11-14 DIAGNOSIS — M6281 Muscle weakness (generalized): Secondary | ICD-10-CM | POA: Diagnosis not present

## 2017-11-14 DIAGNOSIS — J449 Chronic obstructive pulmonary disease, unspecified: Secondary | ICD-10-CM | POA: Diagnosis not present

## 2017-11-14 DIAGNOSIS — S40812D Abrasion of left upper arm, subsequent encounter: Secondary | ICD-10-CM | POA: Diagnosis not present

## 2017-11-14 DIAGNOSIS — I2510 Atherosclerotic heart disease of native coronary artery without angina pectoris: Secondary | ICD-10-CM | POA: Diagnosis not present

## 2017-11-14 DIAGNOSIS — I10 Essential (primary) hypertension: Secondary | ICD-10-CM | POA: Diagnosis not present

## 2017-11-17 DIAGNOSIS — M6281 Muscle weakness (generalized): Secondary | ICD-10-CM | POA: Diagnosis not present

## 2017-11-17 DIAGNOSIS — I10 Essential (primary) hypertension: Secondary | ICD-10-CM | POA: Diagnosis not present

## 2017-11-17 DIAGNOSIS — J449 Chronic obstructive pulmonary disease, unspecified: Secondary | ICD-10-CM | POA: Diagnosis not present

## 2017-11-17 DIAGNOSIS — S40812D Abrasion of left upper arm, subsequent encounter: Secondary | ICD-10-CM | POA: Diagnosis not present

## 2017-11-17 DIAGNOSIS — I2510 Atherosclerotic heart disease of native coronary artery without angina pectoris: Secondary | ICD-10-CM | POA: Diagnosis not present

## 2017-11-17 DIAGNOSIS — R2689 Other abnormalities of gait and mobility: Secondary | ICD-10-CM | POA: Diagnosis not present

## 2017-11-18 DIAGNOSIS — M6281 Muscle weakness (generalized): Secondary | ICD-10-CM | POA: Diagnosis not present

## 2017-11-18 DIAGNOSIS — S40812D Abrasion of left upper arm, subsequent encounter: Secondary | ICD-10-CM | POA: Diagnosis not present

## 2017-11-18 DIAGNOSIS — R2689 Other abnormalities of gait and mobility: Secondary | ICD-10-CM | POA: Diagnosis not present

## 2017-11-18 DIAGNOSIS — I10 Essential (primary) hypertension: Secondary | ICD-10-CM | POA: Diagnosis not present

## 2017-11-18 DIAGNOSIS — J449 Chronic obstructive pulmonary disease, unspecified: Secondary | ICD-10-CM | POA: Diagnosis not present

## 2017-11-18 DIAGNOSIS — I2510 Atherosclerotic heart disease of native coronary artery without angina pectoris: Secondary | ICD-10-CM | POA: Diagnosis not present

## 2017-11-21 DIAGNOSIS — R2689 Other abnormalities of gait and mobility: Secondary | ICD-10-CM | POA: Diagnosis not present

## 2017-11-21 DIAGNOSIS — S40812D Abrasion of left upper arm, subsequent encounter: Secondary | ICD-10-CM | POA: Diagnosis not present

## 2017-11-21 DIAGNOSIS — J449 Chronic obstructive pulmonary disease, unspecified: Secondary | ICD-10-CM | POA: Diagnosis not present

## 2017-11-21 DIAGNOSIS — I2510 Atherosclerotic heart disease of native coronary artery without angina pectoris: Secondary | ICD-10-CM | POA: Diagnosis not present

## 2017-11-21 DIAGNOSIS — M6281 Muscle weakness (generalized): Secondary | ICD-10-CM | POA: Diagnosis not present

## 2017-11-21 DIAGNOSIS — I10 Essential (primary) hypertension: Secondary | ICD-10-CM | POA: Diagnosis not present

## 2017-11-23 DIAGNOSIS — R2689 Other abnormalities of gait and mobility: Secondary | ICD-10-CM | POA: Diagnosis not present

## 2017-11-23 DIAGNOSIS — J449 Chronic obstructive pulmonary disease, unspecified: Secondary | ICD-10-CM | POA: Diagnosis not present

## 2017-11-23 DIAGNOSIS — I10 Essential (primary) hypertension: Secondary | ICD-10-CM | POA: Diagnosis not present

## 2017-11-23 DIAGNOSIS — I2510 Atherosclerotic heart disease of native coronary artery without angina pectoris: Secondary | ICD-10-CM | POA: Diagnosis not present

## 2017-11-23 DIAGNOSIS — S40812D Abrasion of left upper arm, subsequent encounter: Secondary | ICD-10-CM | POA: Diagnosis not present

## 2017-11-23 DIAGNOSIS — M6281 Muscle weakness (generalized): Secondary | ICD-10-CM | POA: Diagnosis not present

## 2017-11-25 DIAGNOSIS — I2510 Atherosclerotic heart disease of native coronary artery without angina pectoris: Secondary | ICD-10-CM | POA: Diagnosis not present

## 2017-11-25 DIAGNOSIS — R2689 Other abnormalities of gait and mobility: Secondary | ICD-10-CM | POA: Diagnosis not present

## 2017-11-25 DIAGNOSIS — J449 Chronic obstructive pulmonary disease, unspecified: Secondary | ICD-10-CM | POA: Diagnosis not present

## 2017-11-25 DIAGNOSIS — I10 Essential (primary) hypertension: Secondary | ICD-10-CM | POA: Diagnosis not present

## 2017-11-25 DIAGNOSIS — M6281 Muscle weakness (generalized): Secondary | ICD-10-CM | POA: Diagnosis not present

## 2017-11-25 DIAGNOSIS — S40812D Abrasion of left upper arm, subsequent encounter: Secondary | ICD-10-CM | POA: Diagnosis not present

## 2017-11-28 DIAGNOSIS — I2510 Atherosclerotic heart disease of native coronary artery without angina pectoris: Secondary | ICD-10-CM | POA: Diagnosis not present

## 2017-11-28 DIAGNOSIS — J449 Chronic obstructive pulmonary disease, unspecified: Secondary | ICD-10-CM | POA: Diagnosis not present

## 2017-11-28 DIAGNOSIS — M6281 Muscle weakness (generalized): Secondary | ICD-10-CM | POA: Diagnosis not present

## 2017-11-28 DIAGNOSIS — R2689 Other abnormalities of gait and mobility: Secondary | ICD-10-CM | POA: Diagnosis not present

## 2017-11-28 DIAGNOSIS — I10 Essential (primary) hypertension: Secondary | ICD-10-CM | POA: Diagnosis not present

## 2017-11-28 DIAGNOSIS — S40812D Abrasion of left upper arm, subsequent encounter: Secondary | ICD-10-CM | POA: Diagnosis not present

## 2017-11-29 DIAGNOSIS — R2689 Other abnormalities of gait and mobility: Secondary | ICD-10-CM | POA: Diagnosis not present

## 2017-11-29 DIAGNOSIS — S40812D Abrasion of left upper arm, subsequent encounter: Secondary | ICD-10-CM | POA: Diagnosis not present

## 2017-11-29 DIAGNOSIS — I10 Essential (primary) hypertension: Secondary | ICD-10-CM | POA: Diagnosis not present

## 2017-11-29 DIAGNOSIS — M6281 Muscle weakness (generalized): Secondary | ICD-10-CM | POA: Diagnosis not present

## 2017-11-29 DIAGNOSIS — J449 Chronic obstructive pulmonary disease, unspecified: Secondary | ICD-10-CM | POA: Diagnosis not present

## 2017-11-29 DIAGNOSIS — I2510 Atherosclerotic heart disease of native coronary artery without angina pectoris: Secondary | ICD-10-CM | POA: Diagnosis not present

## 2017-11-30 DIAGNOSIS — J449 Chronic obstructive pulmonary disease, unspecified: Secondary | ICD-10-CM | POA: Diagnosis not present

## 2017-11-30 DIAGNOSIS — R2689 Other abnormalities of gait and mobility: Secondary | ICD-10-CM | POA: Diagnosis not present

## 2017-11-30 DIAGNOSIS — M6281 Muscle weakness (generalized): Secondary | ICD-10-CM | POA: Diagnosis not present

## 2017-11-30 DIAGNOSIS — S40812D Abrasion of left upper arm, subsequent encounter: Secondary | ICD-10-CM | POA: Diagnosis not present

## 2017-11-30 DIAGNOSIS — I10 Essential (primary) hypertension: Secondary | ICD-10-CM | POA: Diagnosis not present

## 2017-11-30 DIAGNOSIS — I2510 Atherosclerotic heart disease of native coronary artery without angina pectoris: Secondary | ICD-10-CM | POA: Diagnosis not present

## 2017-12-05 DIAGNOSIS — M6281 Muscle weakness (generalized): Secondary | ICD-10-CM | POA: Diagnosis not present

## 2017-12-05 DIAGNOSIS — I10 Essential (primary) hypertension: Secondary | ICD-10-CM | POA: Diagnosis not present

## 2017-12-05 DIAGNOSIS — R2689 Other abnormalities of gait and mobility: Secondary | ICD-10-CM | POA: Diagnosis not present

## 2017-12-05 DIAGNOSIS — I2510 Atherosclerotic heart disease of native coronary artery without angina pectoris: Secondary | ICD-10-CM | POA: Diagnosis not present

## 2017-12-05 DIAGNOSIS — J449 Chronic obstructive pulmonary disease, unspecified: Secondary | ICD-10-CM | POA: Diagnosis not present

## 2017-12-05 DIAGNOSIS — S40812D Abrasion of left upper arm, subsequent encounter: Secondary | ICD-10-CM | POA: Diagnosis not present

## 2017-12-12 DIAGNOSIS — J449 Chronic obstructive pulmonary disease, unspecified: Secondary | ICD-10-CM | POA: Diagnosis not present

## 2017-12-12 DIAGNOSIS — R2689 Other abnormalities of gait and mobility: Secondary | ICD-10-CM | POA: Diagnosis not present

## 2017-12-12 DIAGNOSIS — I10 Essential (primary) hypertension: Secondary | ICD-10-CM | POA: Diagnosis not present

## 2017-12-12 DIAGNOSIS — S40812D Abrasion of left upper arm, subsequent encounter: Secondary | ICD-10-CM | POA: Diagnosis not present

## 2017-12-12 DIAGNOSIS — M6281 Muscle weakness (generalized): Secondary | ICD-10-CM | POA: Diagnosis not present

## 2017-12-12 DIAGNOSIS — I2510 Atherosclerotic heart disease of native coronary artery without angina pectoris: Secondary | ICD-10-CM | POA: Diagnosis not present

## 2017-12-15 DIAGNOSIS — E039 Hypothyroidism, unspecified: Secondary | ICD-10-CM | POA: Diagnosis not present

## 2017-12-15 DIAGNOSIS — I6529 Occlusion and stenosis of unspecified carotid artery: Secondary | ICD-10-CM | POA: Diagnosis not present

## 2017-12-15 DIAGNOSIS — K219 Gastro-esophageal reflux disease without esophagitis: Secondary | ICD-10-CM | POA: Diagnosis not present

## 2017-12-15 DIAGNOSIS — E46 Unspecified protein-calorie malnutrition: Secondary | ICD-10-CM | POA: Diagnosis not present

## 2017-12-15 DIAGNOSIS — R490 Dysphonia: Secondary | ICD-10-CM | POA: Diagnosis not present

## 2017-12-15 DIAGNOSIS — I2510 Atherosclerotic heart disease of native coronary artery without angina pectoris: Secondary | ICD-10-CM | POA: Diagnosis not present

## 2017-12-15 DIAGNOSIS — A881 Epidemic vertigo: Secondary | ICD-10-CM | POA: Diagnosis not present

## 2017-12-15 DIAGNOSIS — E0590 Thyrotoxicosis, unspecified without thyrotoxic crisis or storm: Secondary | ICD-10-CM | POA: Diagnosis not present

## 2017-12-15 DIAGNOSIS — E875 Hyperkalemia: Secondary | ICD-10-CM | POA: Diagnosis not present

## 2017-12-15 DIAGNOSIS — D509 Iron deficiency anemia, unspecified: Secondary | ICD-10-CM | POA: Diagnosis not present

## 2017-12-15 DIAGNOSIS — E782 Mixed hyperlipidemia: Secondary | ICD-10-CM | POA: Diagnosis not present

## 2017-12-15 DIAGNOSIS — J449 Chronic obstructive pulmonary disease, unspecified: Secondary | ICD-10-CM | POA: Diagnosis not present

## 2017-12-20 DIAGNOSIS — B351 Tinea unguium: Secondary | ICD-10-CM | POA: Diagnosis not present

## 2017-12-20 DIAGNOSIS — N182 Chronic kidney disease, stage 2 (mild): Secondary | ICD-10-CM | POA: Diagnosis not present

## 2017-12-20 DIAGNOSIS — H6123 Impacted cerumen, bilateral: Secondary | ICD-10-CM | POA: Diagnosis not present

## 2017-12-20 DIAGNOSIS — M79674 Pain in right toe(s): Secondary | ICD-10-CM | POA: Diagnosis not present

## 2017-12-20 DIAGNOSIS — C329 Malignant neoplasm of larynx, unspecified: Secondary | ICD-10-CM | POA: Diagnosis not present

## 2017-12-20 DIAGNOSIS — D509 Iron deficiency anemia, unspecified: Secondary | ICD-10-CM | POA: Diagnosis not present

## 2017-12-20 DIAGNOSIS — I10 Essential (primary) hypertension: Secondary | ICD-10-CM | POA: Diagnosis not present

## 2017-12-20 DIAGNOSIS — R945 Abnormal results of liver function studies: Secondary | ICD-10-CM | POA: Diagnosis not present

## 2017-12-20 DIAGNOSIS — M79675 Pain in left toe(s): Secondary | ICD-10-CM | POA: Diagnosis not present

## 2018-01-18 DIAGNOSIS — E782 Mixed hyperlipidemia: Secondary | ICD-10-CM | POA: Diagnosis not present

## 2018-02-12 ENCOUNTER — Emergency Department (HOSPITAL_COMMUNITY)
Admission: EM | Admit: 2018-02-12 | Discharge: 2018-02-13 | Disposition: A | Discharge status: Home / Self Care | Payer: Medicare Other | Attending: Emergency Medicine | Admitting: Emergency Medicine

## 2018-02-12 DIAGNOSIS — M25522 Pain in left elbow: Secondary | ICD-10-CM | POA: Diagnosis not present

## 2018-02-12 DIAGNOSIS — Z87891 Personal history of nicotine dependence: Secondary | ICD-10-CM | POA: Diagnosis not present

## 2018-02-12 DIAGNOSIS — Z79899 Other long term (current) drug therapy: Secondary | ICD-10-CM | POA: Insufficient documentation

## 2018-02-12 DIAGNOSIS — E039 Hypothyroidism, unspecified: Secondary | ICD-10-CM | POA: Insufficient documentation

## 2018-02-12 DIAGNOSIS — I2510 Atherosclerotic heart disease of native coronary artery without angina pectoris: Secondary | ICD-10-CM | POA: Diagnosis not present

## 2018-02-12 DIAGNOSIS — W19XXXA Unspecified fall, initial encounter: Secondary | ICD-10-CM

## 2018-02-12 DIAGNOSIS — M546 Pain in thoracic spine: Secondary | ICD-10-CM | POA: Diagnosis not present

## 2018-02-12 DIAGNOSIS — J449 Chronic obstructive pulmonary disease, unspecified: Secondary | ICD-10-CM | POA: Insufficient documentation

## 2018-02-12 DIAGNOSIS — R102 Pelvic and perineal pain: Secondary | ICD-10-CM | POA: Diagnosis not present

## 2018-02-12 DIAGNOSIS — M545 Low back pain: Secondary | ICD-10-CM | POA: Insufficient documentation

## 2018-02-12 DIAGNOSIS — S3993XA Unspecified injury of pelvis, initial encounter: Secondary | ICD-10-CM | POA: Diagnosis not present

## 2018-02-12 DIAGNOSIS — W010XXA Fall on same level from slipping, tripping and stumbling without subsequent striking against object, initial encounter: Secondary | ICD-10-CM | POA: Diagnosis not present

## 2018-02-12 DIAGNOSIS — S299XXA Unspecified injury of thorax, initial encounter: Secondary | ICD-10-CM | POA: Diagnosis not present

## 2018-02-12 NOTE — ED Triage Notes (Signed)
Unwitness fall, patient was walking to bathroom with walker and fell backwards, complaining of back pain.

## 2018-02-13 ENCOUNTER — Encounter (HOSPITAL_COMMUNITY): Payer: Self-pay | Admitting: Emergency Medicine

## 2018-02-13 ENCOUNTER — Other Ambulatory Visit: Payer: Self-pay

## 2018-02-13 ENCOUNTER — Emergency Department (HOSPITAL_COMMUNITY): Payer: Medicare Other

## 2018-02-13 DIAGNOSIS — S299XXA Unspecified injury of thorax, initial encounter: Secondary | ICD-10-CM | POA: Diagnosis not present

## 2018-02-13 DIAGNOSIS — S3993XA Unspecified injury of pelvis, initial encounter: Secondary | ICD-10-CM | POA: Diagnosis not present

## 2018-02-13 DIAGNOSIS — M546 Pain in thoracic spine: Secondary | ICD-10-CM | POA: Diagnosis not present

## 2018-02-13 DIAGNOSIS — M545 Low back pain: Secondary | ICD-10-CM | POA: Diagnosis not present

## 2018-02-13 DIAGNOSIS — R102 Pelvic and perineal pain: Secondary | ICD-10-CM | POA: Diagnosis not present

## 2018-02-13 MED ORDER — ACETAMINOPHEN 500 MG PO TABS
1000.0000 mg | ORAL_TABLET | Freq: Once | ORAL | Status: AC
  Administered 2018-02-13: 1000 mg via ORAL
  Filled 2018-02-13: qty 2

## 2018-02-13 MED ORDER — ACETAMINOPHEN 325 MG PO TABS: 325.0000 mg | ORAL_TABLET | Freq: Two times a day (BID) | ORAL | 0 refills | Status: DC | PRN

## 2018-02-13 NOTE — ED Provider Notes (Signed)
[facility] EMERGENCY DEPARTMENT Provider Note   CSN: [id number] Arrival date & time: 02/12/18  2352     History   Chief Complaint Chief Complaint  Patient presents with  . Fall    HPI Luis Guerra is a 84 y.o. male.  Patient is leg slipped out underneath him on the bathroom floor and he fell landing on his butt and subsequently had some back left pelvis and left elbow pain.  Left elbow pain is improved but still has some back pain and left pelvis pain.  Did not hit his head did not pass out.   Fall  This is a new problem. The current episode started 1 to 2 hours ago. The problem occurs constantly. The problem has not changed since onset.Pertinent negatives include no chest pain and no abdominal pain. Nothing aggravates the symptoms. Nothing relieves the symptoms. He has tried nothing for the symptoms.    Past Medical History:  Diagnosis Date  . AAA (abdominal aortic aneurysm) ([place])   . Cancer ([place])    stomach cancer  . Carotid occlusion, right   . COPD (chronic obstructive pulmonary disease) ([place])   . Coronary artery disease    WITH HISTORY OF OCCLUSION OF A DIAGONAL BRANCH  . Dysphagia   . Gastritis   . Hiatal hernia   . Hypothyroidism   . SBO (small bowel obstruction) ([place])   . Weight loss     Patient Active Problem List   Diagnosis Date Noted  . Arthritis of knee 07/17/2013  . Weight loss 03/12/2013  . Sinus bradycardia 01/16/2013  . Lightheaded 01/14/2013  . Lightheadedness 01/13/2013  . Ataxia 01/13/2013  . Elevated transaminase level 01/13/2013  . History of gastric cancer 09/17/2012  . Abdominal pain 07/17/2012  . Knee pain 12/07/2011  . OA (osteoarthritis) of knee 12/07/2011  . Coronary artery disease   . Carotid occlusion, right   . INTERDIGITAL NEUROMA 04/10/2009  . JOINT EFFUSION, LEFT KNEE 04/10/2009  . DEGENERATIVE JOINT DISEASE, RIGHT KNEE 02/05/2009  . OSTEOARTHRITIS, LOWER LEG 01/30/2009  . DERANGEMENT OF POSTERIOR HORN OF MEDIAL  MENISCUS 01/30/2009  . BUCKET HANDLE TEAR OF LATERAL MENISCUS 01/30/2009  . DERANGEMENT MENISCUS 01/30/2009  . Pain in joint, lower leg 01/30/2009    Past Surgical History:  Procedure Laterality Date  . ABDOMINAL AORTIC ANEURYSM REPAIR    . BACK SURGERY    . BALLOON DILATION  05/26/2011   Procedure: BALLOON DILATION;  Surgeon: Rogene Houston, MD;  [facility];  Service: Endoscopy;  Laterality: N/A;  . CARDIAC CATHETERIZATION  06/08/2007   EF 55%  . CARDIOVASCULAR STRESS TEST  05/03/2006   EF 60%  . CHOLECYSTECTOMY    . COLONOSCOPY  07/22/2010  . ESOPHAGOGASTRODUODENOSCOPY  05/26/2011   Procedure: ESOPHAGOGASTRODUODENOSCOPY (EGD);  Surgeon: Rogene Houston, MD;  [facility];  Service: Endoscopy;  Laterality: N/A;  3:00  . ESOPHAGOGASTRODUODENOSCOPY  07/21/2012   Procedure: ESOPHAGOGASTRODUODENOSCOPY (EGD);  Surgeon: Rogene Houston, MD;  [facility];  Service: Endoscopy;  Laterality: N/A;  325-changed to 225 Ann to notify pt  . HERNIA REPAIR    . low back surgery    . UPPER GASTROINTESTINAL ENDOSCOPY  07/13/06  . UPPER GASTROINTESTINAL ENDOSCOPY  02/19/05  . UPPER GASTROINTESTINAL ENDOSCOPY  07/22/2010   EGD ED        Home Medications    Prior to Admission medications   Medication Sig Start Date End Date Taking? Authorizing Provider  acetaminophen (TYLENOL) 500  MG tablet Take 1,000 mg by mouth 2 (two) times daily.    Yes [provider]  benzonatate (TESSALON) 100 MG capsule Take 100 mg by mouth 3 (three) times daily as needed for cough.   Yes [provider]  fexofenadine (ALLEGRA) 180 MG tablet Take 180 mg by mouth daily. 09/04/15  Yes [provider]  Fluticasone-Salmeterol (ADVAIR DISKUS) 250-50 MCG/DOSE AEPB Inhale 1 puff into the lungs every 12 (twelve) hours. Reported on 09/16/2015   Yes [provider]  levothyroxine (SYNTHROID, LEVOTHROID) 100 MCG tablet Take 1 tablet by mouth daily. 08/16/16  Yes  [provider]  losartan (COZAAR) 25 MG tablet Take 0.5 tablets (12.5 mg total) by mouth daily. Patient taking differently: Take 25 mg by mouth daily.  10/29/16  Yes BranchAlphonse Guild, MD  olopatadine (PATANOL) 0.1 % ophthalmic solution Place 1 drop into both eyes 2 (two) times daily.  10/13/15  Yes [provider]  pantoprazole (PROTONIX) 40 MG tablet TAKE 1 TABLET BY MOUTH EVERY DAY 09/04/15  Yes Rehman, Mechele Dawley, MD    Family History Family History  Problem Relation Age of Onset  . Heart disease Father   . Healthy Sister   . Heart disease Sister   . Stroke Sister   . Stroke Daughter   . Diabetes Daughter   . Healthy Son     Social History Social History   Tobacco Use  . Smoking status: Former Smoker    Last attempt to quit: 08/30/1957    Years since quitting: 60.4  . Smokeless tobacco: Never Used  . Tobacco comment: Quit smoking in 1959  Substance Use Topics  . Alcohol use: No    Alcohol/week: 0.0 oz  . Drug use: No     Allergies   Aspirin; Esomeprazole magnesium; Niacin; and Penicillins   Review of Systems Review of Systems  Cardiovascular: Negative for chest pain.  Gastrointestinal: Negative for abdominal pain.  All other systems reviewed and are negative.    Physical Exam Updated Vital Signs BP (!) 136/57 (BP Location: Left Arm)   Pulse (!) 55   Temp 97.8 F (36.6 C) (Oral)   Resp 18   Ht 6' (1.829 m)   Wt 72.6 kg (160 lb)   SpO2 97%   BMI 21.70 kg/m   Physical Exam  Constitutional: He is oriented to person, place, and time. He appears well-developed and well-nourished.  HENT:  Head: Normocephalic and atraumatic.  Eyes: Conjunctivae and EOM are normal.  Neck: Normal range of motion.  Cardiovascular: Normal rate.  Pulmonary/Chest: Effort normal. No respiratory distress.  Abdominal: Soft. He exhibits no distension.  Musculoskeletal: Normal range of motion. He exhibits tenderness (lower T spine and left pelvis).  Neurological: He  is alert and oriented to person, place, and time. No cranial nerve deficit. Coordination normal.  Skin: Skin is warm and dry.  Nursing note and vitals reviewed.    ED Treatments / Results  Labs (all labs ordered are listed, but only abnormal results are displayed) Labs Reviewed - No data to display  EKG None  Radiology No results found.  Procedures Procedures (including critical care time)  Medications Ordered in ED Medications  acetaminophen (TYLENOL) tablet 1,000 mg (1,000 mg Oral Given 02/13/18 0049)     Initial Impression / Assessment and Plan / ED Course  I have reviewed the triage vital signs and the nursing notes.  Pertinent labs & imaging results that were available during my care of the patient were reviewed  by me and considered in my medical decision making (see chart for details).     Mechanical fall, eval for fractures.   No fractures. Ambulates normally. Stable for discharge.   Final Clinical Impressions(s) / ED Diagnoses   Final diagnoses:  Fall    ED Discharge Orders    None       Jawon Dipiero, Corene Cornea, MD 02/13/18 [phone]

## 2018-02-13 NOTE — ED Notes (Signed)
Patient ambulated to restroom with assistance of one. 

## 2018-02-14 DIAGNOSIS — J449 Chronic obstructive pulmonary disease, unspecified: Secondary | ICD-10-CM | POA: Diagnosis not present

## 2018-02-14 DIAGNOSIS — I10 Essential (primary) hypertension: Secondary | ICD-10-CM | POA: Diagnosis not present

## 2018-02-14 DIAGNOSIS — I2510 Atherosclerotic heart disease of native coronary artery without angina pectoris: Secondary | ICD-10-CM | POA: Diagnosis not present

## 2018-02-14 DIAGNOSIS — R296 Repeated falls: Secondary | ICD-10-CM | POA: Diagnosis not present

## 2018-02-21 DIAGNOSIS — R52 Pain, unspecified: Secondary | ICD-10-CM | POA: Diagnosis not present

## 2018-02-21 DIAGNOSIS — J449 Chronic obstructive pulmonary disease, unspecified: Secondary | ICD-10-CM | POA: Diagnosis not present

## 2018-02-21 DIAGNOSIS — M79675 Pain in left toe(s): Secondary | ICD-10-CM | POA: Diagnosis not present

## 2018-02-21 DIAGNOSIS — E782 Mixed hyperlipidemia: Secondary | ICD-10-CM | POA: Diagnosis not present

## 2018-02-21 DIAGNOSIS — M79674 Pain in right toe(s): Secondary | ICD-10-CM | POA: Diagnosis not present

## 2018-02-21 DIAGNOSIS — E46 Unspecified protein-calorie malnutrition: Secondary | ICD-10-CM | POA: Diagnosis not present

## 2018-02-21 DIAGNOSIS — I6529 Occlusion and stenosis of unspecified carotid artery: Secondary | ICD-10-CM | POA: Diagnosis not present

## 2018-02-21 DIAGNOSIS — E0590 Thyrotoxicosis, unspecified without thyrotoxic crisis or storm: Secondary | ICD-10-CM | POA: Diagnosis not present

## 2018-02-21 DIAGNOSIS — R490 Dysphonia: Secondary | ICD-10-CM | POA: Diagnosis not present

## 2018-02-21 DIAGNOSIS — B351 Tinea unguium: Secondary | ICD-10-CM | POA: Diagnosis not present

## 2018-02-21 DIAGNOSIS — I2510 Atherosclerotic heart disease of native coronary artery without angina pectoris: Secondary | ICD-10-CM | POA: Diagnosis not present

## 2018-02-21 DIAGNOSIS — E039 Hypothyroidism, unspecified: Secondary | ICD-10-CM | POA: Diagnosis not present

## 2018-02-21 DIAGNOSIS — D509 Iron deficiency anemia, unspecified: Secondary | ICD-10-CM | POA: Diagnosis not present

## 2018-02-21 DIAGNOSIS — K219 Gastro-esophageal reflux disease without esophagitis: Secondary | ICD-10-CM | POA: Diagnosis not present

## 2018-02-21 DIAGNOSIS — A881 Epidemic vertigo: Secondary | ICD-10-CM | POA: Diagnosis not present

## 2018-03-08 ENCOUNTER — Encounter: Payer: Self-pay | Admitting: Orthopedic Surgery

## 2018-03-08 ENCOUNTER — Ambulatory Visit (INDEPENDENT_AMBULATORY_CARE_PROVIDER_SITE_OTHER): Payer: Medicare Other | Admitting: Orthopedic Surgery

## 2018-03-08 VITALS — BP 116/70 | HR 80 | Ht 72.0 in | Wt 145.0 lb

## 2018-03-08 DIAGNOSIS — G8929 Other chronic pain: Secondary | ICD-10-CM

## 2018-03-08 DIAGNOSIS — M25561 Pain in right knee: Secondary | ICD-10-CM | POA: Diagnosis not present

## 2018-03-08 DIAGNOSIS — M25562 Pain in left knee: Secondary | ICD-10-CM

## 2018-03-08 NOTE — Progress Notes (Signed)
Chief Complaint  Patient presents with  . Injections    bilateral knees     Procedure note left knee injection verbal consent was obtained to inject left knee joint  Timeout was completed to confirm the site of injection  The medications used were 40 mg of Depo-Medrol and 1% lidocaine 3 cc  Anesthesia was provided by ethyl chloride and the skin was prepped with alcohol.  After cleaning the skin with alcohol a 20-gauge needle was used to inject the left knee joint. There were no complications. A sterile bandage was applied.   Procedure note right knee injection verbal consent was obtained to inject right knee joint  Timeout was completed to confirm the site of injection  The medications used were 40 mg of Depo-Medrol and 1% lidocaine 3 cc  Anesthesia was provided by ethyl chloride and the skin was prepped with alcohol.  After cleaning the skin with alcohol a 20-gauge needle was used to inject the right knee joint. There were no complications. A sterile bandage was applied.   Encounter Diagnosis  Name Primary?  . Bilateral chronic knee pain Yes    Fu prn

## 2018-03-14 DIAGNOSIS — J040 Acute laryngitis: Secondary | ICD-10-CM | POA: Diagnosis not present

## 2018-03-14 DIAGNOSIS — Z681 Body mass index (BMI) 19 or less, adult: Secondary | ICD-10-CM | POA: Diagnosis not present

## 2018-03-15 ENCOUNTER — Emergency Department (HOSPITAL_COMMUNITY)
Admission: EM | Admit: 2018-03-15 | Discharge: 2018-03-15 | Disposition: A | Discharge status: Home / Self Care | Payer: Medicare Other | Attending: Emergency Medicine | Admitting: Emergency Medicine

## 2018-03-15 ENCOUNTER — Encounter (HOSPITAL_COMMUNITY): Payer: Self-pay

## 2018-03-15 ENCOUNTER — Other Ambulatory Visit: Payer: Self-pay

## 2018-03-15 ENCOUNTER — Emergency Department (HOSPITAL_COMMUNITY): Payer: Medicare Other

## 2018-03-15 DIAGNOSIS — R609 Edema, unspecified: Secondary | ICD-10-CM | POA: Insufficient documentation

## 2018-03-15 DIAGNOSIS — E039 Hypothyroidism, unspecified: Secondary | ICD-10-CM | POA: Diagnosis not present

## 2018-03-15 DIAGNOSIS — Z85028 Personal history of other malignant neoplasm of stomach: Secondary | ICD-10-CM | POA: Diagnosis not present

## 2018-03-15 DIAGNOSIS — Z79899 Other long term (current) drug therapy: Secondary | ICD-10-CM | POA: Insufficient documentation

## 2018-03-15 DIAGNOSIS — Z87891 Personal history of nicotine dependence: Secondary | ICD-10-CM | POA: Insufficient documentation

## 2018-03-15 DIAGNOSIS — J449 Chronic obstructive pulmonary disease, unspecified: Secondary | ICD-10-CM | POA: Insufficient documentation

## 2018-03-15 DIAGNOSIS — I2510 Atherosclerotic heart disease of native coronary artery without angina pectoris: Secondary | ICD-10-CM | POA: Diagnosis not present

## 2018-03-15 DIAGNOSIS — R600 Localized edema: Secondary | ICD-10-CM | POA: Diagnosis not present

## 2018-03-15 LAB — COMPREHENSIVE METABOLIC PANEL
ALBUMIN: 2.9 g/dL — AB (ref 3.5–5.0)
ALK PHOS: 111 U/L (ref 38–126)
ALT: 20 U/L (ref 0–44)
AST: 18 U/L (ref 15–41)
Anion gap: 7 (ref 5–15)
BILIRUBIN TOTAL: 0.5 mg/dL (ref 0.3–1.2)
BUN: 24 mg/dL — AB (ref 8–23)
CALCIUM: 7.7 mg/dL — AB (ref 8.9–10.3)
CO2: 20 mmol/L — AB (ref 22–32)
CREATININE: 1.45 mg/dL — AB (ref 0.61–1.24)
Chloride: 111 mmol/L (ref 98–111)
GFR calc Af Amer: 46 mL/min — ABNORMAL LOW (ref >60)
GFR calc non Af Amer: 39 mL/min — ABNORMAL LOW (ref >60)
GLUCOSE: 102 mg/dL — AB (ref 70–99)
Potassium: 4.1 mmol/L (ref 3.5–5.1)
SODIUM: 138 mmol/L (ref 135–145)
TOTAL PROTEIN: 5.9 g/dL — AB (ref 6.5–8.1)

## 2018-03-15 LAB — URINALYSIS, ROUTINE W REFLEX MICROSCOPIC
BILIRUBIN URINE: NEGATIVE
GLUCOSE, UA: NEGATIVE mg/dL
HGB URINE DIPSTICK: NEGATIVE
KETONES UR: NEGATIVE mg/dL
Leukocytes, UA: NEGATIVE
Nitrite: NEGATIVE
PH: 6 (ref 5.0–8.0)
Protein, ur: NEGATIVE mg/dL
Specific Gravity, Urine: 1.011 (ref 1.005–1.030)

## 2018-03-15 LAB — CBC WITH DIFFERENTIAL/PLATELET
BASOS PCT: 0 %
Basophils Absolute: 0 10*3/uL (ref 0.0–0.1)
EOS ABS: 0.2 10*3/uL (ref 0.0–0.7)
Eosinophils Relative: 3 %
HEMATOCRIT: 31 % — AB (ref 39.0–52.0)
Hemoglobin: 9.6 g/dL — ABNORMAL LOW (ref 13.0–17.0)
Lymphocytes Relative: 9 %
Lymphs Abs: 0.7 10*3/uL (ref 0.7–4.0)
MCH: 27.7 pg (ref 26.0–34.0)
MCHC: 31 g/dL (ref 30.0–36.0)
MCV: 89.3 fL (ref 78.0–100.0)
MONOS PCT: 11 %
Monocytes Absolute: 0.8 10*3/uL (ref 0.1–1.0)
NEUTROS ABS: 5.6 10*3/uL (ref 1.7–7.7)
Neutrophils Relative %: 77 %
Platelets: 182 10*3/uL (ref 150–400)
RBC: 3.47 MIL/uL — AB (ref 4.22–5.81)
RDW: 17 % — ABNORMAL HIGH (ref 11.5–15.5)
WBC: 7.3 10*3/uL (ref 4.0–10.5)

## 2018-03-15 LAB — SALICYLATE LEVEL: Salicylate Lvl: <7 mg/dL (ref 2.8–30.0)

## 2018-03-15 LAB — ACETAMINOPHEN LEVEL: Acetaminophen (Tylenol), Serum: <10 ug/mL — ABNORMAL LOW (ref 10–30)

## 2018-03-15 LAB — D-DIMER, QUANTITATIVE: D-Dimer, Quant: 3.94 ug/mL-FEU — ABNORMAL HIGH (ref 0.00–0.50)

## 2018-03-15 NOTE — ED Notes (Signed)
[place]

## 2018-03-15 NOTE — ED Provider Notes (Signed)
[facility] EMERGENCY DEPARTMENT Provider Note   CSN: [id number] Arrival date & time: 03/15/18  1555     History   Chief Complaint Chief Complaint  Patient presents with  . Leg Swelling    HPI Luis Guerra is a 84 y.o. male.  HPI  Elderlyl 84 y/o male - from NH - has hx of AAA and CA and COPD and hx of overusing tylenol with hx of liver dysfunction(recently) who was reduced on his tylenol dose - the family members are the primary historians and relate that he has continued to use Tylenol despite being told not to stating that he wants to take it for his pain, he is not getting more specific than that. Evidently he had elevated liver function test to his family doctor's office and thus they cut back his Tylenol which she was taking way too much. Of note the family members found a bottle of Tylenol in his room with which he is self-medicating. The patient cannot tell me how much he takes. He has had some swelling in his legs for a couple of months however it got acutely worse over the last 24 hours with the right side greater than the left side. He has never had a DVT, never had known kidney failure, overt liver failure or any other swelling. He has no shortness of breath. No fevers. No rash. No medications given prior to arrival. Symptoms are persistent, nothing seems to make this better or worse. He has not been using his compression stockings today.  Family called his PCP office today and were told to come to ED emergently for evaluation of the leg swelling.  Past Medical History:  Diagnosis Date  . AAA (abdominal aortic aneurysm) ([place])   . Cancer ([place])    stomach cancer  . Carotid occlusion, right   . COPD (chronic obstructive pulmonary disease) ([place])   . Coronary artery disease    WITH HISTORY OF OCCLUSION OF A DIAGONAL BRANCH  . Dysphagia   . Gastritis   . Hiatal hernia   . Hypothyroidism   . SBO (small bowel obstruction) ([place])   . Weight loss     Patient Active  Problem List   Diagnosis Date Noted  . Arthritis of knee 07/17/2013  . Weight loss 03/12/2013  . Sinus bradycardia 01/16/2013  . Lightheaded 01/14/2013  . Lightheadedness 01/13/2013  . Ataxia 01/13/2013  . Elevated transaminase level 01/13/2013  . History of gastric cancer 09/17/2012  . Abdominal pain 07/17/2012  . Knee pain 12/07/2011  . OA (osteoarthritis) of knee 12/07/2011  . Coronary artery disease   . Carotid occlusion, right   . INTERDIGITAL NEUROMA 04/10/2009  . JOINT EFFUSION, LEFT KNEE 04/10/2009  . DEGENERATIVE JOINT DISEASE, RIGHT KNEE 02/05/2009  . OSTEOARTHRITIS, LOWER LEG 01/30/2009  . DERANGEMENT OF POSTERIOR HORN OF MEDIAL MENISCUS 01/30/2009  . BUCKET HANDLE TEAR OF LATERAL MENISCUS 01/30/2009  . DERANGEMENT MENISCUS 01/30/2009  . Pain in joint, lower leg 01/30/2009    Past Surgical History:  Procedure Laterality Date  . ABDOMINAL AORTIC ANEURYSM REPAIR    . BACK SURGERY    . BALLOON DILATION  05/26/2011   Procedure: BALLOON DILATION;  Surgeon: Rogene Houston, MD;  Location: [facility];  Service: Endoscopy;  Laterality: N/A;  . CARDIAC CATHETERIZATION  06/08/2007   EF 55%  . CARDIOVASCULAR STRESS TEST  05/03/2006   EF 60%  . CHOLECYSTECTOMY    . COLONOSCOPY  07/22/2010  . ESOPHAGOGASTRODUODENOSCOPY  05/26/2011  Procedure: ESOPHAGOGASTRODUODENOSCOPY (EGD);  Surgeon: Rogene Houston, MD;  Location: [facility];  Service: Endoscopy;  Laterality: N/A;  3:00  . ESOPHAGOGASTRODUODENOSCOPY  07/21/2012   Procedure: ESOPHAGOGASTRODUODENOSCOPY (EGD);  Surgeon: Rogene Houston, MD;  Location: [facility];  Service: Endoscopy;  Laterality: N/A;  325-changed to 225 Ann to notify pt  . HERNIA REPAIR    . low back surgery    . UPPER GASTROINTESTINAL ENDOSCOPY  07/13/06  . UPPER GASTROINTESTINAL ENDOSCOPY  02/19/05  . UPPER GASTROINTESTINAL ENDOSCOPY  07/22/2010   EGD ED        Home Medications    Prior to Admission medications   Medication Sig Start Date  End Date Taking? Authorizing Provider  acetaminophen (TYLENOL) 500 MG tablet Take 500 mg by mouth every 8 (eight) hours as needed for mild pain or moderate pain.    Yes [provider]  benzonatate (TESSALON) 100 MG capsule Take 100 mg by mouth 2 (two) times daily as needed for cough.    Yes [provider]  diclofenac sodium (VOLTAREN) 1 % GEL Apply topically 3 (three) times daily as needed (for pain).   Yes [provider]  fexofenadine (ALLEGRA) 180 MG tablet Take 180 mg by mouth daily. 09/04/15  Yes [provider]  Fluticasone-Salmeterol (ADVAIR) 250-50 MCG/DOSE AEPB Inhale 1 puff into the lungs 2 (two) times daily.    Yes [provider]  levothyroxine (SYNTHROID, LEVOTHROID) 125 MCG tablet Take 125 mcg by mouth daily before breakfast.  02/09/18  Yes [provider]  losartan (COZAAR) 25 MG tablet Take 0.5 tablets (12.5 mg total) by mouth daily. Patient taking differently: Take 25 mg by mouth daily.  10/29/16  Yes BranchAlphonse Guild, MD  olopatadine (PATANOL) 0.1 % ophthalmic solution Place 1 drop into both eyes 2 (two) times daily.  10/13/15  Yes [provider]  pantoprazole (PROTONIX) 40 MG tablet TAKE 1 TABLET BY MOUTH EVERY DAY 09/04/15  Yes Rehman, Mechele Dawley, MD    Family History Family History  Problem Relation Age of Onset  . Heart disease Father   . Healthy Sister   . Heart disease Sister   . Stroke Sister   . Stroke Daughter   . Diabetes Daughter   . Healthy Son     Social History Social History   Tobacco Use  . Smoking status: Former Smoker    Last attempt to quit: 08/30/1957    Years since quitting: 60.5  . Smokeless tobacco: Never Used  . Tobacco comment: Quit smoking in 1959  Substance Use Topics  . Alcohol use: No    Alcohol/week: 0.0 oz  . Drug use: No     Allergies   Aspirin; Esomeprazole magnesium; Niacin; and Penicillins   Review of Systems Review of Systems  All other systems reviewed and are  negative.    Physical Exam Updated Vital Signs BP (S) 138/67   Pulse 65   Temp (!) 97.5 F (36.4 C) (Oral)   Resp 18   Ht 6' (1.829 m)   Wt 62.1 kg (137 lb)   SpO2 99%   BMI 18.58 kg/m   Physical Exam  Constitutional: He appears well-developed and well-nourished. No distress.  HENT:  Head: Normocephalic and atraumatic.  Mouth/Throat: Oropharynx is clear and moist. No oropharyngeal exudate.  Eyes: Pupils are equal, round, and reactive to light. Conjunctivae and EOM are normal. Right eye exhibits no discharge. Left eye exhibits no discharge. No scleral icterus.  Neck: Normal range of  motion. Neck supple. No JVD present. No thyromegaly present.  Cardiovascular: Normal rate, regular rhythm, normal heart sounds and intact distal pulses. Exam reveals no gallop and no friction rub.  No murmur heard. Ectopy present occasionally  Pulmonary/Chest: Effort normal and breath sounds normal. No respiratory distress. He has no wheezes. He has no rales.  Abdominal: Soft. Bowel sounds are normal. He exhibits no distension and no mass. There is no tenderness.  Musculoskeletal: Normal range of motion. He exhibits no edema ( R>L edema at the enkles) or tenderness.  Lymphadenopathy:    He has no cervical adenopathy.  Neurological: He is alert. Coordination normal.  Skin: Skin is warm and dry. No rash noted. No erythema.  Psychiatric: He has a normal mood and affect. His behavior is normal.  Nursing note and vitals reviewed.    ED Treatments / Results  Labs (all labs ordered are listed, but only abnormal results are displayed) Labs Reviewed  CBC WITH DIFFERENTIAL/PLATELET - Abnormal; Notable for the following components:      Result Value   RBC 3.47 (*)    Hemoglobin 9.6 (*)    HCT 31.0 (*)    RDW 17.0 (*)    All other components within normal limits  COMPREHENSIVE METABOLIC PANEL - Abnormal; Notable for the following components:   CO2 20 (*)    Glucose, Bld 102 (*)    BUN 24 (*)     Creatinine, Ser 1.45 (*)    Calcium 7.7 (*)    Total Protein 5.9 (*)    Albumin 2.9 (*)    GFR calc non Af Amer 39 (*)    GFR calc Af Amer 46 (*)    All other components within normal limits  ACETAMINOPHEN LEVEL - Abnormal; Notable for the following components:   Acetaminophen (Tylenol), Serum <10 (*)    All other components within normal limits  D-DIMER, QUANTITATIVE (NOT AT [facility]) - Abnormal; Notable for the following components:   D-Dimer, Quant 3.94 (*)    All other components within normal limits  URINALYSIS, ROUTINE W REFLEX MICROSCOPIC  SALICYLATE LEVEL    EKG None  Radiology US Venous Img Lower Bilateral  Result Date: 03/15/2018 CLINICAL DATA:  Pain and edema, right worse than left EXAM: BILATERAL LOWER EXTREMITY VENOUS DOPPLER ULTRASOUND TECHNIQUE: Gray-scale sonography with compression, as well as color and duplex ultrasound, were performed to evaluate the deep venous system from the level of the common femoral vein through the popliteal and proximal calf veins. COMPARISON:  None FINDINGS: Normal compressibility of the common femoral, superficial femoral, and popliteal veins, as well as the proximal calf veins. No filling defects to suggest DVT on grayscale or color Doppler imaging. Doppler waveforms show normal direction of venous flow, normal respiratory phasicity and response to augmentation. Subcutaneous edema in the distal right lower extremity. IMPRESSION: No evidence of  lower extremity deep vein thrombosis. Electronically Signed   By: Lucrezia Europe M.D.   On: 03/15/2018 17:55    Procedures Procedures (including critical care time)  Medications Ordered in ED Medications - No data to display   Initial Impression / Assessment and Plan / ED Course  I have reviewed the triage vital signs and the nursing notes.  Pertinent labs & imaging results that were available during my care of the patient were reviewed by me and considered in my medical decision making (see chart for  details).     The patient has what appears to be asymmetrical swelling right greater than left at  the ankles, he is able to lift both legs, he has no pulmonary edema clinically, no JVD, no history of heart failure, no history of renal failure, reported history of transaminitis and hasn't indiscriminate use of Tylenol. At this time he'll need further lab workup, d-dimer and ultrasound however he has presented at about the time that we no longer have [place] here.  Anemia consistent with last measurement, low but not critical, patient not having blood in the stools per his report  Ultrasound negative, labs show low protein in the serum, low albumin, creatinine was slightly elevated, I do not think that the patient needs Lasix as he is likely volume contracted, encouraged lots of clear liquids, encouraged compression stockings, elevating the feet and higher protein diet. Patient and family agreeable, stable for discharge  Final Clinical Impressions(s) / ED Diagnoses   Final diagnoses:  Peripheral edema    ED Discharge Orders    None       Noemi Chapel, MD 03/15/18 [phone]

## 2018-03-15 NOTE — Discharge Instructions (Signed)
Your testing shows that you have low protein levels in her bloodstream, this is partly because of your liver disease (you are not to take any more Tylenol ever), it is also because you are not likely eating enough protein in your diet. Please drink protein shakes or eat enough protein to help increase this number, avoid high salt, drink plenty of clear liquids, keep her legs elevated when you are resting and wear the compression stockings daily to help reduce swelling. Emergency department for severe or worsening symptoms.  Your ultrasound did not show any signs of blood clots.

## 2018-03-15 NOTE — ED Triage Notes (Addendum)
Pt sent over by Dr Durene Cal office due to swelling in lower extremities. Right worse than left. Denies SOB. Right leg starting to hurt pt. Swelling has been present for a few weeks. Family reports he received cortisone injections in knees last week from Dr Aline Brochure. Pt lives at [facility]. Family reports that they found 2 large bottles of tylenol in pts room and not sure how many he is taking along with what staff are adminstering

## 2018-03-22 DIAGNOSIS — N182 Chronic kidney disease, stage 2 (mild): Secondary | ICD-10-CM | POA: Diagnosis not present

## 2018-03-22 DIAGNOSIS — Z6820 Body mass index (BMI) 20.0-20.9, adult: Secondary | ICD-10-CM | POA: Diagnosis not present

## 2018-03-22 DIAGNOSIS — D509 Iron deficiency anemia, unspecified: Secondary | ICD-10-CM | POA: Diagnosis not present

## 2018-03-22 DIAGNOSIS — R600 Localized edema: Secondary | ICD-10-CM | POA: Diagnosis not present

## 2018-03-31 ENCOUNTER — Emergency Department (HOSPITAL_COMMUNITY)
Admission: EM | Admit: 2018-03-31 | Discharge: 2018-03-31 | Disposition: A | Discharge status: Skilled Nursing Facility | Payer: Medicare Other | Attending: Emergency Medicine | Admitting: Emergency Medicine

## 2018-03-31 ENCOUNTER — Emergency Department (HOSPITAL_COMMUNITY): Payer: Medicare Other

## 2018-03-31 ENCOUNTER — Other Ambulatory Visit: Payer: Self-pay

## 2018-03-31 ENCOUNTER — Encounter (HOSPITAL_COMMUNITY): Payer: Self-pay | Admitting: Emergency Medicine

## 2018-03-31 DIAGNOSIS — I259 Chronic ischemic heart disease, unspecified: Secondary | ICD-10-CM | POA: Insufficient documentation

## 2018-03-31 DIAGNOSIS — Z79899 Other long term (current) drug therapy: Secondary | ICD-10-CM | POA: Insufficient documentation

## 2018-03-31 DIAGNOSIS — J189 Pneumonia, unspecified organism: Secondary | ICD-10-CM | POA: Insufficient documentation

## 2018-03-31 DIAGNOSIS — Z87891 Personal history of nicotine dependence: Secondary | ICD-10-CM | POA: Diagnosis not present

## 2018-03-31 DIAGNOSIS — E039 Hypothyroidism, unspecified: Secondary | ICD-10-CM | POA: Insufficient documentation

## 2018-03-31 DIAGNOSIS — J449 Chronic obstructive pulmonary disease, unspecified: Secondary | ICD-10-CM | POA: Diagnosis not present

## 2018-03-31 DIAGNOSIS — R05 Cough: Secondary | ICD-10-CM | POA: Diagnosis not present

## 2018-03-31 MED ORDER — LEVOFLOXACIN 500 MG PO TABS
500.0000 mg | ORAL_TABLET | Freq: Once | ORAL | Status: AC
  Administered 2018-03-31: 500 mg via ORAL
  Filled 2018-03-31: qty 1

## 2018-03-31 MED ORDER — IPRATROPIUM-ALBUTEROL 0.5-2.5 (3) MG/3ML IN SOLN
3.0000 mL | Freq: Once | RESPIRATORY_TRACT | Status: AC
  Administered 2018-03-31: 3 mL via RESPIRATORY_TRACT
  Filled 2018-03-31: qty 3

## 2018-03-31 MED ORDER — LEVOFLOXACIN 500 MG PO TABS: 500.0000 mg | ORAL_TABLET | Freq: Every day | ORAL | 0 refills | Status: DC

## 2018-03-31 NOTE — ED Notes (Signed)
Patient transported to X-ray 

## 2018-03-31 NOTE — ED Triage Notes (Signed)
Pt from [facility].  Pt c/o of productive cough x 2 weeks with left rib cage pain.

## 2018-03-31 NOTE — ED Notes (Signed)
Script e faxed to wrong pharmacy. Called into [facility] Levaquin 500mg  daily x 6 days per Dr Wilson Singer

## 2018-03-31 NOTE — ED Provider Notes (Signed)
[facility] EMERGENCY DEPARTMENT Provider Note   CSN: [id number] Arrival date & time: 03/31/18  0177     History   Chief Complaint Chief Complaint  Patient presents with  . Cough    HPI Luis Guerra is a 84 y.o. male.  HPI   84 year old male with cough.  Patient states that began 2 weeks ago but family says he had a doctor's appointment about a week ago and was not complaining of it then.  He has had a persistent cough.  He feels like he cannot clear his throat.  Associated with some left-sided chest pain when he coughs.  He does not feel short of breath though.  No fevers or chills.  He was seen approximately 2 weeks ago the emergency room for some lower extremity edema.  He states that this has essentially resolved since beginning to wear compression hose.  He did ultrasound at that time which was negative for DVT.  Denies orthopnea.  Past Medical History:  Diagnosis Date  . AAA (abdominal aortic aneurysm) ([place])   . Cancer ([place])    stomach cancer  . Carotid occlusion, right   . COPD (chronic obstructive pulmonary disease) ([place])   . Coronary artery disease    WITH HISTORY OF OCCLUSION OF A DIAGONAL BRANCH  . Dysphagia   . Gastritis   . Hiatal hernia   . Hypothyroidism   . SBO (small bowel obstruction) ([place])   . Weight loss     Patient Active Problem List   Diagnosis Date Noted  . Arthritis of knee 07/17/2013  . Weight loss 03/12/2013  . Sinus bradycardia 01/16/2013  . Lightheaded 01/14/2013  . Lightheadedness 01/13/2013  . Ataxia 01/13/2013  . Elevated transaminase level 01/13/2013  . History of gastric cancer 09/17/2012  . Abdominal pain 07/17/2012  . Knee pain 12/07/2011  . OA (osteoarthritis) of knee 12/07/2011  . Coronary artery disease   . Carotid occlusion, right   . INTERDIGITAL NEUROMA 04/10/2009  . JOINT EFFUSION, LEFT KNEE 04/10/2009  . DEGENERATIVE JOINT DISEASE, RIGHT KNEE 02/05/2009  . OSTEOARTHRITIS, LOWER LEG 01/30/2009  . DERANGEMENT OF  POSTERIOR HORN OF MEDIAL MENISCUS 01/30/2009  . BUCKET HANDLE TEAR OF LATERAL MENISCUS 01/30/2009  . DERANGEMENT MENISCUS 01/30/2009  . Pain in joint, lower leg 01/30/2009    Past Surgical History:  Procedure Laterality Date  . ABDOMINAL AORTIC ANEURYSM REPAIR    . BACK SURGERY    . BALLOON DILATION  05/26/2011   Procedure: BALLOON DILATION;  Surgeon: Rogene Houston, MD;  [facility];  Service: Endoscopy;  Laterality: N/A;  . CARDIAC CATHETERIZATION  06/08/2007   EF 55%  . CARDIOVASCULAR STRESS TEST  05/03/2006   EF 60%  . CHOLECYSTECTOMY    . COLONOSCOPY  07/22/2010  . ESOPHAGOGASTRODUODENOSCOPY  05/26/2011   Procedure: ESOPHAGOGASTRODUODENOSCOPY (EGD);  Surgeon: Rogene Houston, MD;  Location[facility];  Service: Endoscopy;  Laterality: N/A;  3:00  . ESOPHAGOGASTRODUODENOSCOPY  07/21/2012   Procedure: ESOPHAGOGASTRODUODENOSCOPY (EGD);  Surgeon: Rogene Houston, MD;  [facility];  Service: Endoscopy;  Laterality: N/A;  325-changed to 225 Ann to notify pt  . HERNIA REPAIR    . low back surgery    . UPPER GASTROINTESTINAL ENDOSCOPY  07/13/06  . UPPER GASTROINTESTINAL ENDOSCOPY  02/19/05  . UPPER GASTROINTESTINAL ENDOSCOPY  07/22/2010   EGD ED        Home Medications    Prior to Admission medications   Medication Sig Start Date  End Date Taking? Authorizing Provider  acetaminophen (TYLENOL) 500 MG tablet Take 500 mg by mouth every 8 (eight) hours as needed for mild pain or moderate pain.     [provider]  benzonatate (TESSALON) 100 MG capsule Take 100 mg by mouth 2 (two) times daily as needed for cough.     [provider]  diclofenac sodium (VOLTAREN) 1 % GEL Apply topically 3 (three) times daily as needed (for pain).    [provider]  fexofenadine (ALLEGRA) 180 MG tablet Take 180 mg by mouth daily. 09/04/15   [provider]  Fluticasone-Salmeterol (ADVAIR) 250-50 MCG/DOSE AEPB Inhale 1 puff into the lungs 2  (two) times daily.     [provider]  levothyroxine (SYNTHROID, LEVOTHROID) 125 MCG tablet Take 125 mcg by mouth daily before breakfast.  02/09/18   [provider]  losartan (COZAAR) 25 MG tablet Take 0.5 tablets (12.5 mg total) by mouth daily. Patient taking differently: Take 25 mg by mouth daily.  10/29/16   Arnoldo Lenis, MD  olopatadine (PATANOL) 0.1 % ophthalmic solution Place 1 drop into both eyes 2 (two) times daily.  10/13/15   [provider]  pantoprazole (PROTONIX) 40 MG tablet TAKE 1 TABLET BY MOUTH EVERY DAY 09/04/15   Rehman, Mechele Dawley, MD    Family History Family History  Problem Relation Age of Onset  . Heart disease Father   . Healthy Sister   . Heart disease Sister   . Stroke Sister   . Stroke Daughter   . Diabetes Daughter   . Healthy Son     Social History Social History   Tobacco Use  . Smoking status: Former Smoker    Last attempt to quit: 08/30/1957    Years since quitting: 60.6  . Smokeless tobacco: Never Used  . Tobacco comment: Quit smoking in 1959  Substance Use Topics  . Alcohol use: No    Alcohol/week: 0.0 oz  . Drug use: No     Allergies   Aspirin; Esomeprazole magnesium; Niacin; and Penicillins   Review of Systems Review of Systems  All systems reviewed and negative, other than as noted in HPI.  Physical Exam Updated Vital Signs BP 128/72 (BP Location: Right Arm)   Pulse 62   Temp 97.8 F (36.6 C) (Oral)   Resp (!) 22   SpO2 100%   Physical Exam  Constitutional: He appears well-developed and well-nourished. No distress.  HENT:  Head: Normocephalic and atraumatic.  Eyes: Conjunctivae are normal. Right eye exhibits no discharge. Left eye exhibits no discharge.  Neck: Neck supple.  Cardiovascular: Normal rate, regular rhythm and normal heart sounds. Exam reveals no gallop and no friction rub.  No murmur heard. Pulmonary/Chest: Effort normal. No respiratory distress.  Speaks in complete sentences.  Occasional cough. Coarse breath sounds b/l with occasional faint wheeze.   Abdominal: Soft. He exhibits no distension. There is no tenderness.  Musculoskeletal: He exhibits no edema or tenderness.  Neurological: He is alert.  Skin: Skin is warm and dry.  Psychiatric: He has a normal mood and affect. His behavior is normal. Thought content normal.  Nursing note and vitals reviewed.    ED Treatments / Results  Labs (all labs ordered are listed, but only abnormal results are displayed) Labs Reviewed - No data to display  EKG None  Radiology Dg Chest 2 View  Result Date: 03/31/2018 CLINICAL DATA:  Cough for couple weeks, lower LEFT side chest soreness, history abdominal aortic aneurysm, gastric  cancer, COPD, former smoker EXAM: CHEST - 2 VIEW COMPARISON:  11/07/2016 FINDINGS: Normal heart size and pulmonary vascularity. Markedly tortuous thoracic aorta with atherosclerotic calcification. Suspect aneurysmal dilatation at the aortic arch. Opacity identified identified in superior segment LEFT lower lobe most consistent with pneumonia. Minimal bibasilar interstitial prominence slightly increased since previous exam. RIGHT upper lobe scarring and central peribronchial thickening in seen. Bones demineralized. IMPRESSION: Atherosclerotic calcifications of a markedly tortuous and suspect aneurysmal thoracic aorta. Opacity in superior segment LEFT lower lobe most consistent with pneumonia. Followup PA and lateral chest X-ray is recommended in 3-4 weeks following trial of antibiotic therapy to ensure resolution and exclude underlying malignancy. Electronically Signed   By: Lavonia Dana M.D.   On: 03/31/2018 09:30    Procedures Procedures (including critical care time)  Medications Ordered in ED Medications  ipratropium-albuterol (DUONEB) 0.5-2.5 (3) MG/3ML nebulizer solution 3 mL (has no administration in time range)     Initial Impression / Assessment and Plan / ED Course  I have reviewed the  triage vital signs and the nursing notes.  Pertinent labs & imaging results that were available during my care of the patient were reviewed by me and considered in my medical decision making (see chart for details).     89yM with cough and L sided CP and upper abdominal soreness. Bronchitis? COPD exacerbation? Appears comfortable aside from when coughing. Afebrile.WOB is not significantly increased. I think pneumonia is less likely. Doesn't sounds wet. No orthopnea. Reports edema has improved. I doubt HF. CP and abdominal pain likely from coughing. I doubt PE. Very atypical for ACS.   Will check CXR. If not acute findings then plan to tx as bronchitis. With duration of symptoms and history of COPD will also place on antibiotics.   9:39 AM CXR shows L sided infiltrate which correlates to area he is having pain. Upper abdominal pain likely muscle soreness from coughing. Abdominal exam is pretty benign. Despite his advanced age, I think he is appropriate for outpt tx. Symptoms have been going on for a week. He is afebrile. Nontoxic. WOB is not significant increased. o2 sats normal. PCN allergy.   It has been determined that no acute conditions requiring further emergency intervention are present at this time. The patient has been advised of the diagnosis and plan. I reviewed any labs and imaging including any potential incidental findings. We have discussed signs and symptoms that warrant return to the ED and they are listed in the discharge instructions.    Final Clinical Impressions(s) / ED Diagnoses   Final diagnoses:  Community acquired pneumonia of left lung, unspecified part of lung    ED Discharge Orders    None       Virgel Manifold, MD 03/31/18 [phone]

## 2018-03-31 NOTE — Discharge Instructions (Signed)
You had your first dose of antibiotics in the ER. It is once daily dosing. Begin taking prescription on 8/3 and continue until finished.

## 2018-04-05 DIAGNOSIS — J440 Chronic obstructive pulmonary disease with acute lower respiratory infection: Secondary | ICD-10-CM | POA: Diagnosis not present

## 2018-04-05 DIAGNOSIS — R1310 Dysphagia, unspecified: Secondary | ICD-10-CM | POA: Diagnosis not present

## 2018-04-05 DIAGNOSIS — L89322 Pressure ulcer of left buttock, stage 2: Secondary | ICD-10-CM | POA: Diagnosis not present

## 2018-04-05 DIAGNOSIS — I6521 Occlusion and stenosis of right carotid artery: Secondary | ICD-10-CM | POA: Diagnosis not present

## 2018-04-05 DIAGNOSIS — J441 Chronic obstructive pulmonary disease with (acute) exacerbation: Secondary | ICD-10-CM | POA: Diagnosis not present

## 2018-04-05 DIAGNOSIS — K56609 Unspecified intestinal obstruction, unspecified as to partial versus complete obstruction: Secondary | ICD-10-CM | POA: Diagnosis not present

## 2018-04-05 DIAGNOSIS — R634 Abnormal weight loss: Secondary | ICD-10-CM | POA: Diagnosis not present

## 2018-04-05 DIAGNOSIS — I10 Essential (primary) hypertension: Secondary | ICD-10-CM | POA: Diagnosis not present

## 2018-04-05 DIAGNOSIS — I2510 Atherosclerotic heart disease of native coronary artery without angina pectoris: Secondary | ICD-10-CM | POA: Diagnosis not present

## 2018-04-05 DIAGNOSIS — C169 Malignant neoplasm of stomach, unspecified: Secondary | ICD-10-CM | POA: Diagnosis not present

## 2018-04-05 DIAGNOSIS — J189 Pneumonia, unspecified organism: Secondary | ICD-10-CM | POA: Diagnosis not present

## 2018-04-05 DIAGNOSIS — Z95828 Presence of other vascular implants and grafts: Secondary | ICD-10-CM | POA: Diagnosis not present

## 2018-04-11 DIAGNOSIS — C169 Malignant neoplasm of stomach, unspecified: Secondary | ICD-10-CM | POA: Diagnosis not present

## 2018-04-11 DIAGNOSIS — J440 Chronic obstructive pulmonary disease with acute lower respiratory infection: Secondary | ICD-10-CM | POA: Diagnosis not present

## 2018-04-11 DIAGNOSIS — L89322 Pressure ulcer of left buttock, stage 2: Secondary | ICD-10-CM | POA: Diagnosis not present

## 2018-04-11 DIAGNOSIS — J189 Pneumonia, unspecified organism: Secondary | ICD-10-CM | POA: Diagnosis not present

## 2018-04-11 DIAGNOSIS — J441 Chronic obstructive pulmonary disease with (acute) exacerbation: Secondary | ICD-10-CM | POA: Diagnosis not present

## 2018-04-11 DIAGNOSIS — I2510 Atherosclerotic heart disease of native coronary artery without angina pectoris: Secondary | ICD-10-CM | POA: Diagnosis not present

## 2018-04-13 DIAGNOSIS — I2510 Atherosclerotic heart disease of native coronary artery without angina pectoris: Secondary | ICD-10-CM | POA: Diagnosis not present

## 2018-04-13 DIAGNOSIS — J189 Pneumonia, unspecified organism: Secondary | ICD-10-CM | POA: Diagnosis not present

## 2018-04-13 DIAGNOSIS — J441 Chronic obstructive pulmonary disease with (acute) exacerbation: Secondary | ICD-10-CM | POA: Diagnosis not present

## 2018-04-13 DIAGNOSIS — L89322 Pressure ulcer of left buttock, stage 2: Secondary | ICD-10-CM | POA: Diagnosis not present

## 2018-04-13 DIAGNOSIS — C169 Malignant neoplasm of stomach, unspecified: Secondary | ICD-10-CM | POA: Diagnosis not present

## 2018-04-13 DIAGNOSIS — J440 Chronic obstructive pulmonary disease with acute lower respiratory infection: Secondary | ICD-10-CM | POA: Diagnosis not present

## 2018-04-17 DIAGNOSIS — C169 Malignant neoplasm of stomach, unspecified: Secondary | ICD-10-CM | POA: Diagnosis not present

## 2018-04-17 DIAGNOSIS — J189 Pneumonia, unspecified organism: Secondary | ICD-10-CM | POA: Diagnosis not present

## 2018-04-17 DIAGNOSIS — J441 Chronic obstructive pulmonary disease with (acute) exacerbation: Secondary | ICD-10-CM | POA: Diagnosis not present

## 2018-04-17 DIAGNOSIS — J440 Chronic obstructive pulmonary disease with acute lower respiratory infection: Secondary | ICD-10-CM | POA: Diagnosis not present

## 2018-04-17 DIAGNOSIS — L89322 Pressure ulcer of left buttock, stage 2: Secondary | ICD-10-CM | POA: Diagnosis not present

## 2018-04-17 DIAGNOSIS — I2510 Atherosclerotic heart disease of native coronary artery without angina pectoris: Secondary | ICD-10-CM | POA: Diagnosis not present

## 2018-04-19 DIAGNOSIS — I2510 Atherosclerotic heart disease of native coronary artery without angina pectoris: Secondary | ICD-10-CM | POA: Diagnosis not present

## 2018-04-19 DIAGNOSIS — J189 Pneumonia, unspecified organism: Secondary | ICD-10-CM | POA: Diagnosis not present

## 2018-04-19 DIAGNOSIS — L89322 Pressure ulcer of left buttock, stage 2: Secondary | ICD-10-CM | POA: Diagnosis not present

## 2018-04-19 DIAGNOSIS — J441 Chronic obstructive pulmonary disease with (acute) exacerbation: Secondary | ICD-10-CM | POA: Diagnosis not present

## 2018-04-19 DIAGNOSIS — J440 Chronic obstructive pulmonary disease with acute lower respiratory infection: Secondary | ICD-10-CM | POA: Diagnosis not present

## 2018-04-19 DIAGNOSIS — C169 Malignant neoplasm of stomach, unspecified: Secondary | ICD-10-CM | POA: Diagnosis not present

## 2018-04-21 DIAGNOSIS — I2510 Atherosclerotic heart disease of native coronary artery without angina pectoris: Secondary | ICD-10-CM | POA: Diagnosis not present

## 2018-04-21 DIAGNOSIS — L89322 Pressure ulcer of left buttock, stage 2: Secondary | ICD-10-CM | POA: Diagnosis not present

## 2018-04-21 DIAGNOSIS — C169 Malignant neoplasm of stomach, unspecified: Secondary | ICD-10-CM | POA: Diagnosis not present

## 2018-04-21 DIAGNOSIS — J189 Pneumonia, unspecified organism: Secondary | ICD-10-CM | POA: Diagnosis not present

## 2018-04-21 DIAGNOSIS — J440 Chronic obstructive pulmonary disease with acute lower respiratory infection: Secondary | ICD-10-CM | POA: Diagnosis not present

## 2018-04-21 DIAGNOSIS — J441 Chronic obstructive pulmonary disease with (acute) exacerbation: Secondary | ICD-10-CM | POA: Diagnosis not present

## 2018-04-24 DIAGNOSIS — J189 Pneumonia, unspecified organism: Secondary | ICD-10-CM | POA: Diagnosis not present

## 2018-04-24 DIAGNOSIS — J441 Chronic obstructive pulmonary disease with (acute) exacerbation: Secondary | ICD-10-CM | POA: Diagnosis not present

## 2018-04-24 DIAGNOSIS — L89322 Pressure ulcer of left buttock, stage 2: Secondary | ICD-10-CM | POA: Diagnosis not present

## 2018-04-24 DIAGNOSIS — J440 Chronic obstructive pulmonary disease with acute lower respiratory infection: Secondary | ICD-10-CM | POA: Diagnosis not present

## 2018-04-24 DIAGNOSIS — C169 Malignant neoplasm of stomach, unspecified: Secondary | ICD-10-CM | POA: Diagnosis not present

## 2018-04-24 DIAGNOSIS — I2510 Atherosclerotic heart disease of native coronary artery without angina pectoris: Secondary | ICD-10-CM | POA: Diagnosis not present

## 2018-04-26 DIAGNOSIS — J440 Chronic obstructive pulmonary disease with acute lower respiratory infection: Secondary | ICD-10-CM | POA: Diagnosis not present

## 2018-04-26 DIAGNOSIS — J189 Pneumonia, unspecified organism: Secondary | ICD-10-CM | POA: Diagnosis not present

## 2018-04-26 DIAGNOSIS — L89322 Pressure ulcer of left buttock, stage 2: Secondary | ICD-10-CM | POA: Diagnosis not present

## 2018-04-26 DIAGNOSIS — I2510 Atherosclerotic heart disease of native coronary artery without angina pectoris: Secondary | ICD-10-CM | POA: Diagnosis not present

## 2018-04-26 DIAGNOSIS — C169 Malignant neoplasm of stomach, unspecified: Secondary | ICD-10-CM | POA: Diagnosis not present

## 2018-04-26 DIAGNOSIS — J441 Chronic obstructive pulmonary disease with (acute) exacerbation: Secondary | ICD-10-CM | POA: Diagnosis not present

## 2018-05-02 DIAGNOSIS — I2510 Atherosclerotic heart disease of native coronary artery without angina pectoris: Secondary | ICD-10-CM | POA: Diagnosis not present

## 2018-05-02 DIAGNOSIS — J189 Pneumonia, unspecified organism: Secondary | ICD-10-CM | POA: Diagnosis not present

## 2018-05-02 DIAGNOSIS — L89322 Pressure ulcer of left buttock, stage 2: Secondary | ICD-10-CM | POA: Diagnosis not present

## 2018-05-02 DIAGNOSIS — J441 Chronic obstructive pulmonary disease with (acute) exacerbation: Secondary | ICD-10-CM | POA: Diagnosis not present

## 2018-05-02 DIAGNOSIS — C169 Malignant neoplasm of stomach, unspecified: Secondary | ICD-10-CM | POA: Diagnosis not present

## 2018-05-02 DIAGNOSIS — J440 Chronic obstructive pulmonary disease with acute lower respiratory infection: Secondary | ICD-10-CM | POA: Diagnosis not present

## 2018-05-04 DIAGNOSIS — J189 Pneumonia, unspecified organism: Secondary | ICD-10-CM | POA: Diagnosis not present

## 2018-05-04 DIAGNOSIS — J440 Chronic obstructive pulmonary disease with acute lower respiratory infection: Secondary | ICD-10-CM | POA: Diagnosis not present

## 2018-05-04 DIAGNOSIS — C169 Malignant neoplasm of stomach, unspecified: Secondary | ICD-10-CM | POA: Diagnosis not present

## 2018-05-04 DIAGNOSIS — L89322 Pressure ulcer of left buttock, stage 2: Secondary | ICD-10-CM | POA: Diagnosis not present

## 2018-05-04 DIAGNOSIS — J441 Chronic obstructive pulmonary disease with (acute) exacerbation: Secondary | ICD-10-CM | POA: Diagnosis not present

## 2018-05-04 DIAGNOSIS — I2510 Atherosclerotic heart disease of native coronary artery without angina pectoris: Secondary | ICD-10-CM | POA: Diagnosis not present

## 2018-05-08 DIAGNOSIS — I2510 Atherosclerotic heart disease of native coronary artery without angina pectoris: Secondary | ICD-10-CM | POA: Diagnosis not present

## 2018-05-08 DIAGNOSIS — J441 Chronic obstructive pulmonary disease with (acute) exacerbation: Secondary | ICD-10-CM | POA: Diagnosis not present

## 2018-05-08 DIAGNOSIS — C169 Malignant neoplasm of stomach, unspecified: Secondary | ICD-10-CM | POA: Diagnosis not present

## 2018-05-08 DIAGNOSIS — J440 Chronic obstructive pulmonary disease with acute lower respiratory infection: Secondary | ICD-10-CM | POA: Diagnosis not present

## 2018-05-08 DIAGNOSIS — J189 Pneumonia, unspecified organism: Secondary | ICD-10-CM | POA: Diagnosis not present

## 2018-05-08 DIAGNOSIS — L89322 Pressure ulcer of left buttock, stage 2: Secondary | ICD-10-CM | POA: Diagnosis not present

## 2018-05-10 DIAGNOSIS — I2510 Atherosclerotic heart disease of native coronary artery without angina pectoris: Secondary | ICD-10-CM | POA: Diagnosis not present

## 2018-05-10 DIAGNOSIS — L89322 Pressure ulcer of left buttock, stage 2: Secondary | ICD-10-CM | POA: Diagnosis not present

## 2018-05-10 DIAGNOSIS — J440 Chronic obstructive pulmonary disease with acute lower respiratory infection: Secondary | ICD-10-CM | POA: Diagnosis not present

## 2018-05-10 DIAGNOSIS — J441 Chronic obstructive pulmonary disease with (acute) exacerbation: Secondary | ICD-10-CM | POA: Diagnosis not present

## 2018-05-10 DIAGNOSIS — C169 Malignant neoplasm of stomach, unspecified: Secondary | ICD-10-CM | POA: Diagnosis not present

## 2018-05-10 DIAGNOSIS — J189 Pneumonia, unspecified organism: Secondary | ICD-10-CM | POA: Diagnosis not present

## 2018-05-12 DIAGNOSIS — C169 Malignant neoplasm of stomach, unspecified: Secondary | ICD-10-CM | POA: Diagnosis not present

## 2018-05-12 DIAGNOSIS — I2510 Atherosclerotic heart disease of native coronary artery without angina pectoris: Secondary | ICD-10-CM | POA: Diagnosis not present

## 2018-05-12 DIAGNOSIS — L89322 Pressure ulcer of left buttock, stage 2: Secondary | ICD-10-CM | POA: Diagnosis not present

## 2018-05-12 DIAGNOSIS — J441 Chronic obstructive pulmonary disease with (acute) exacerbation: Secondary | ICD-10-CM | POA: Diagnosis not present

## 2018-05-12 DIAGNOSIS — J189 Pneumonia, unspecified organism: Secondary | ICD-10-CM | POA: Diagnosis not present

## 2018-05-12 DIAGNOSIS — J440 Chronic obstructive pulmonary disease with acute lower respiratory infection: Secondary | ICD-10-CM | POA: Diagnosis not present

## 2018-05-15 DIAGNOSIS — I2510 Atherosclerotic heart disease of native coronary artery without angina pectoris: Secondary | ICD-10-CM | POA: Diagnosis not present

## 2018-05-15 DIAGNOSIS — J189 Pneumonia, unspecified organism: Secondary | ICD-10-CM | POA: Diagnosis not present

## 2018-05-15 DIAGNOSIS — J441 Chronic obstructive pulmonary disease with (acute) exacerbation: Secondary | ICD-10-CM | POA: Diagnosis not present

## 2018-05-15 DIAGNOSIS — L89322 Pressure ulcer of left buttock, stage 2: Secondary | ICD-10-CM | POA: Diagnosis not present

## 2018-05-15 DIAGNOSIS — C169 Malignant neoplasm of stomach, unspecified: Secondary | ICD-10-CM | POA: Diagnosis not present

## 2018-05-15 DIAGNOSIS — J440 Chronic obstructive pulmonary disease with acute lower respiratory infection: Secondary | ICD-10-CM | POA: Diagnosis not present

## 2018-05-16 DIAGNOSIS — L89322 Pressure ulcer of left buttock, stage 2: Secondary | ICD-10-CM | POA: Diagnosis not present

## 2018-05-16 DIAGNOSIS — C169 Malignant neoplasm of stomach, unspecified: Secondary | ICD-10-CM | POA: Diagnosis not present

## 2018-05-16 DIAGNOSIS — J189 Pneumonia, unspecified organism: Secondary | ICD-10-CM | POA: Diagnosis not present

## 2018-05-16 DIAGNOSIS — J441 Chronic obstructive pulmonary disease with (acute) exacerbation: Secondary | ICD-10-CM | POA: Diagnosis not present

## 2018-05-16 DIAGNOSIS — J440 Chronic obstructive pulmonary disease with acute lower respiratory infection: Secondary | ICD-10-CM | POA: Diagnosis not present

## 2018-05-16 DIAGNOSIS — I2510 Atherosclerotic heart disease of native coronary artery without angina pectoris: Secondary | ICD-10-CM | POA: Diagnosis not present

## 2018-05-17 DIAGNOSIS — J441 Chronic obstructive pulmonary disease with (acute) exacerbation: Secondary | ICD-10-CM | POA: Diagnosis not present

## 2018-05-17 DIAGNOSIS — J440 Chronic obstructive pulmonary disease with acute lower respiratory infection: Secondary | ICD-10-CM | POA: Diagnosis not present

## 2018-05-17 DIAGNOSIS — J189 Pneumonia, unspecified organism: Secondary | ICD-10-CM | POA: Diagnosis not present

## 2018-05-17 DIAGNOSIS — C169 Malignant neoplasm of stomach, unspecified: Secondary | ICD-10-CM | POA: Diagnosis not present

## 2018-05-17 DIAGNOSIS — L89322 Pressure ulcer of left buttock, stage 2: Secondary | ICD-10-CM | POA: Diagnosis not present

## 2018-05-17 DIAGNOSIS — I2510 Atherosclerotic heart disease of native coronary artery without angina pectoris: Secondary | ICD-10-CM | POA: Diagnosis not present

## 2018-05-22 DIAGNOSIS — M25561 Pain in right knee: Secondary | ICD-10-CM | POA: Diagnosis not present

## 2018-05-22 DIAGNOSIS — J189 Pneumonia, unspecified organism: Secondary | ICD-10-CM | POA: Diagnosis not present

## 2018-05-22 DIAGNOSIS — R600 Localized edema: Secondary | ICD-10-CM | POA: Diagnosis not present

## 2018-05-22 DIAGNOSIS — J441 Chronic obstructive pulmonary disease with (acute) exacerbation: Secondary | ICD-10-CM | POA: Diagnosis not present

## 2018-05-22 DIAGNOSIS — J440 Chronic obstructive pulmonary disease with acute lower respiratory infection: Secondary | ICD-10-CM | POA: Diagnosis not present

## 2018-05-22 DIAGNOSIS — Z0001 Encounter for general adult medical examination with abnormal findings: Secondary | ICD-10-CM | POA: Diagnosis not present

## 2018-05-22 DIAGNOSIS — I2510 Atherosclerotic heart disease of native coronary artery without angina pectoris: Secondary | ICD-10-CM | POA: Diagnosis not present

## 2018-05-22 DIAGNOSIS — Z6820 Body mass index (BMI) 20.0-20.9, adult: Secondary | ICD-10-CM | POA: Diagnosis not present

## 2018-05-22 DIAGNOSIS — L89322 Pressure ulcer of left buttock, stage 2: Secondary | ICD-10-CM | POA: Diagnosis not present

## 2018-05-22 DIAGNOSIS — C169 Malignant neoplasm of stomach, unspecified: Secondary | ICD-10-CM | POA: Diagnosis not present

## 2018-05-23 DIAGNOSIS — C169 Malignant neoplasm of stomach, unspecified: Secondary | ICD-10-CM | POA: Diagnosis not present

## 2018-05-23 DIAGNOSIS — J440 Chronic obstructive pulmonary disease with acute lower respiratory infection: Secondary | ICD-10-CM | POA: Diagnosis not present

## 2018-05-23 DIAGNOSIS — J189 Pneumonia, unspecified organism: Secondary | ICD-10-CM | POA: Diagnosis not present

## 2018-05-23 DIAGNOSIS — L89322 Pressure ulcer of left buttock, stage 2: Secondary | ICD-10-CM | POA: Diagnosis not present

## 2018-05-23 DIAGNOSIS — I2510 Atherosclerotic heart disease of native coronary artery without angina pectoris: Secondary | ICD-10-CM | POA: Diagnosis not present

## 2018-05-23 DIAGNOSIS — J441 Chronic obstructive pulmonary disease with (acute) exacerbation: Secondary | ICD-10-CM | POA: Diagnosis not present

## 2018-05-25 DIAGNOSIS — J440 Chronic obstructive pulmonary disease with acute lower respiratory infection: Secondary | ICD-10-CM | POA: Diagnosis not present

## 2018-05-25 DIAGNOSIS — L89322 Pressure ulcer of left buttock, stage 2: Secondary | ICD-10-CM | POA: Diagnosis not present

## 2018-05-25 DIAGNOSIS — I2510 Atherosclerotic heart disease of native coronary artery without angina pectoris: Secondary | ICD-10-CM | POA: Diagnosis not present

## 2018-05-25 DIAGNOSIS — J441 Chronic obstructive pulmonary disease with (acute) exacerbation: Secondary | ICD-10-CM | POA: Diagnosis not present

## 2018-05-25 DIAGNOSIS — C169 Malignant neoplasm of stomach, unspecified: Secondary | ICD-10-CM | POA: Diagnosis not present

## 2018-05-25 DIAGNOSIS — J189 Pneumonia, unspecified organism: Secondary | ICD-10-CM | POA: Diagnosis not present

## 2018-05-29 DIAGNOSIS — J440 Chronic obstructive pulmonary disease with acute lower respiratory infection: Secondary | ICD-10-CM | POA: Diagnosis not present

## 2018-05-29 DIAGNOSIS — C169 Malignant neoplasm of stomach, unspecified: Secondary | ICD-10-CM | POA: Diagnosis not present

## 2018-05-29 DIAGNOSIS — L89322 Pressure ulcer of left buttock, stage 2: Secondary | ICD-10-CM | POA: Diagnosis not present

## 2018-05-29 DIAGNOSIS — I2510 Atherosclerotic heart disease of native coronary artery without angina pectoris: Secondary | ICD-10-CM | POA: Diagnosis not present

## 2018-05-29 DIAGNOSIS — J441 Chronic obstructive pulmonary disease with (acute) exacerbation: Secondary | ICD-10-CM | POA: Diagnosis not present

## 2018-05-29 DIAGNOSIS — J189 Pneumonia, unspecified organism: Secondary | ICD-10-CM | POA: Diagnosis not present

## 2018-06-01 DIAGNOSIS — L89322 Pressure ulcer of left buttock, stage 2: Secondary | ICD-10-CM | POA: Diagnosis not present

## 2018-06-01 DIAGNOSIS — J440 Chronic obstructive pulmonary disease with acute lower respiratory infection: Secondary | ICD-10-CM | POA: Diagnosis not present

## 2018-06-01 DIAGNOSIS — C169 Malignant neoplasm of stomach, unspecified: Secondary | ICD-10-CM | POA: Diagnosis not present

## 2018-06-01 DIAGNOSIS — I2510 Atherosclerotic heart disease of native coronary artery without angina pectoris: Secondary | ICD-10-CM | POA: Diagnosis not present

## 2018-06-01 DIAGNOSIS — J189 Pneumonia, unspecified organism: Secondary | ICD-10-CM | POA: Diagnosis not present

## 2018-06-01 DIAGNOSIS — J441 Chronic obstructive pulmonary disease with (acute) exacerbation: Secondary | ICD-10-CM | POA: Diagnosis not present

## 2018-06-02 DIAGNOSIS — J189 Pneumonia, unspecified organism: Secondary | ICD-10-CM | POA: Diagnosis not present

## 2018-06-02 DIAGNOSIS — L89322 Pressure ulcer of left buttock, stage 2: Secondary | ICD-10-CM | POA: Diagnosis not present

## 2018-06-02 DIAGNOSIS — C169 Malignant neoplasm of stomach, unspecified: Secondary | ICD-10-CM | POA: Diagnosis not present

## 2018-06-02 DIAGNOSIS — I2510 Atherosclerotic heart disease of native coronary artery without angina pectoris: Secondary | ICD-10-CM | POA: Diagnosis not present

## 2018-06-02 DIAGNOSIS — J440 Chronic obstructive pulmonary disease with acute lower respiratory infection: Secondary | ICD-10-CM | POA: Diagnosis not present

## 2018-06-02 DIAGNOSIS — J441 Chronic obstructive pulmonary disease with (acute) exacerbation: Secondary | ICD-10-CM | POA: Diagnosis not present

## 2018-06-20 DIAGNOSIS — Z23 Encounter for immunization: Secondary | ICD-10-CM | POA: Diagnosis not present

## 2018-06-21 DIAGNOSIS — D509 Iron deficiency anemia, unspecified: Secondary | ICD-10-CM | POA: Diagnosis not present

## 2018-06-21 DIAGNOSIS — I10 Essential (primary) hypertension: Secondary | ICD-10-CM | POA: Diagnosis not present

## 2018-06-21 DIAGNOSIS — E039 Hypothyroidism, unspecified: Secondary | ICD-10-CM | POA: Diagnosis not present

## 2018-06-21 DIAGNOSIS — E782 Mixed hyperlipidemia: Secondary | ICD-10-CM | POA: Diagnosis not present

## 2018-06-27 DIAGNOSIS — I10 Essential (primary) hypertension: Secondary | ICD-10-CM | POA: Diagnosis not present

## 2018-06-27 DIAGNOSIS — I6521 Occlusion and stenosis of right carotid artery: Secondary | ICD-10-CM | POA: Diagnosis not present

## 2018-06-27 DIAGNOSIS — S50811D Abrasion of right forearm, subsequent encounter: Secondary | ICD-10-CM | POA: Diagnosis not present

## 2018-06-27 DIAGNOSIS — R269 Unspecified abnormalities of gait and mobility: Secondary | ICD-10-CM | POA: Diagnosis not present

## 2018-06-27 DIAGNOSIS — I2510 Atherosclerotic heart disease of native coronary artery without angina pectoris: Secondary | ICD-10-CM | POA: Diagnosis not present

## 2018-06-27 DIAGNOSIS — J449 Chronic obstructive pulmonary disease, unspecified: Secondary | ICD-10-CM | POA: Diagnosis not present

## 2018-06-27 DIAGNOSIS — L89322 Pressure ulcer of left buttock, stage 2: Secondary | ICD-10-CM | POA: Diagnosis not present

## 2018-06-28 DIAGNOSIS — M25561 Pain in right knee: Secondary | ICD-10-CM | POA: Diagnosis not present

## 2018-06-28 DIAGNOSIS — E039 Hypothyroidism, unspecified: Secondary | ICD-10-CM | POA: Diagnosis not present

## 2018-06-28 DIAGNOSIS — C329 Malignant neoplasm of larynx, unspecified: Secondary | ICD-10-CM | POA: Diagnosis not present

## 2018-06-28 DIAGNOSIS — R945 Abnormal results of liver function studies: Secondary | ICD-10-CM | POA: Diagnosis not present

## 2018-06-28 DIAGNOSIS — D530 Protein deficiency anemia: Secondary | ICD-10-CM | POA: Diagnosis not present

## 2018-06-28 DIAGNOSIS — N182 Chronic kidney disease, stage 2 (mild): Secondary | ICD-10-CM | POA: Diagnosis not present

## 2018-06-28 DIAGNOSIS — L989 Disorder of the skin and subcutaneous tissue, unspecified: Secondary | ICD-10-CM | POA: Diagnosis not present

## 2018-06-28 DIAGNOSIS — K219 Gastro-esophageal reflux disease without esophagitis: Secondary | ICD-10-CM | POA: Diagnosis not present

## 2018-06-28 DIAGNOSIS — I2510 Atherosclerotic heart disease of native coronary artery without angina pectoris: Secondary | ICD-10-CM | POA: Diagnosis not present

## 2018-06-28 DIAGNOSIS — D509 Iron deficiency anemia, unspecified: Secondary | ICD-10-CM | POA: Diagnosis not present

## 2018-06-28 DIAGNOSIS — I10 Essential (primary) hypertension: Secondary | ICD-10-CM | POA: Diagnosis not present

## 2018-07-05 ENCOUNTER — Encounter: Payer: Self-pay | Admitting: Orthopedic Surgery

## 2018-07-05 ENCOUNTER — Telehealth: Payer: Self-pay | Admitting: Orthopedic Surgery

## 2018-07-05 ENCOUNTER — Ambulatory Visit (INDEPENDENT_AMBULATORY_CARE_PROVIDER_SITE_OTHER): Payer: Medicare Other | Admitting: Orthopedic Surgery

## 2018-07-05 VITALS — BP 122/50 | HR 51 | Ht 72.0 in

## 2018-07-05 DIAGNOSIS — M25461 Effusion, right knee: Secondary | ICD-10-CM

## 2018-07-05 DIAGNOSIS — M25561 Pain in right knee: Secondary | ICD-10-CM | POA: Diagnosis not present

## 2018-07-05 DIAGNOSIS — G8929 Other chronic pain: Secondary | ICD-10-CM

## 2018-07-05 DIAGNOSIS — M25562 Pain in left knee: Secondary | ICD-10-CM | POA: Diagnosis not present

## 2018-07-05 NOTE — Telephone Encounter (Signed)
I m not doing that

## 2018-07-05 NOTE — Progress Notes (Signed)
RECURRENT PROBLEM OFFICE VISIT  Chief Complaint  Patient presents with  . Knee Pain    bilateral     84 year old male lives in nursing home comes in for evaluation of bilateral knee pain and swelling  History of chronic knee pain previous knee injections about 2 years ago comes in with bilateral knee pain bilateral knee swelling right greater than left difficulty walking painful gait and decreased range of motion pain described as aching without radiation   Review of Systems  Respiratory: Negative for shortness of breath.   Cardiovascular: Negative for chest pain.  Musculoskeletal: Positive for falls and joint pain.     Past Medical History:  Diagnosis Date  . AAA (abdominal aortic aneurysm) ([place])   . Cancer ([place])    stomach cancer  . Carotid occlusion, right   . COPD (chronic obstructive pulmonary disease) ([place])   . Coronary artery disease    WITH HISTORY OF OCCLUSION OF A DIAGONAL BRANCH  . Dysphagia   . Gastritis   . Hiatal hernia   . Hypothyroidism   . SBO (small bowel obstruction) ([place])   . Weight loss     Past Surgical History:  Procedure Laterality Date  . ABDOMINAL AORTIC ANEURYSM REPAIR    . BACK SURGERY    . BALLOON DILATION  05/26/2011   Procedure: BALLOON DILATION;  Surgeon: Rogene Houston, MD;  [facility];  Service: Endoscopy;  Laterality: N/A;  . CARDIAC CATHETERIZATION  06/08/2007   EF 55%  . CARDIOVASCULAR STRESS TEST  05/03/2006   EF 60%  . CHOLECYSTECTOMY    . COLONOSCOPY  07/22/2010  . ESOPHAGOGASTRODUODENOSCOPY  05/26/2011   Procedure: ESOPHAGOGASTRODUODENOSCOPY (EGD);  Surgeon: Rogene Houston, MD;  [facility];  Service: Endoscopy;  Laterality: N/A;  3:00  . ESOPHAGOGASTRODUODENOSCOPY  07/21/2012   Procedure: ESOPHAGOGASTRODUODENOSCOPY (EGD);  Surgeon: Rogene Houston, MD;  [facility];  Service: Endoscopy;  Laterality: N/A;  325-changed to 225 Ann to notify pt  . HERNIA REPAIR    . low back surgery    .  UPPER GASTROINTESTINAL ENDOSCOPY  07/13/06  . UPPER GASTROINTESTINAL ENDOSCOPY  02/19/05  . UPPER GASTROINTESTINAL ENDOSCOPY  07/22/2010   EGD ED    Family History  Problem Relation Age of Onset  . Heart disease Father   . Healthy Sister   . Heart disease Sister   . Stroke Sister   . Stroke Daughter   . Diabetes Daughter   . Healthy Son    Social History   Tobacco Use  . Smoking status: Former Smoker    Last attempt to quit: 08/30/1957    Years since quitting: 60.8  . Smokeless tobacco: Never Used  . Tobacco comment: Quit smoking in 1959  Substance Use Topics  . Alcohol use: No    Alcohol/week: 0.0 standard drinks  . Drug use: No    Allergies  Allergen Reactions  . Aspirin Swelling  . Esomeprazole Magnesium Diarrhea  . Niacin Swelling  . Penicillins Swelling    Has patient had a PCN reaction causing immediate rash, facial/tongue/throat swelling, SOB or lightheadedness with hypotension: YES Has patient had a PCN reaction causing severe rash involving mucus membranes or skin necrosis: NO Has patient had a PCN reaction that required hospitalization: NO Has patient had a PCN reaction occurring within the last 10 years: NO If all of the above answers are "NO", then may proceed with Cephalosporin use.     Current Meds  Medication Sig  .  acetaminophen (TYLENOL) 500 MG tablet Take 500 mg by mouth every 8 (eight) hours as needed for mild pain or moderate pain.   . benzonatate (TESSALON) 100 MG capsule Take 100 mg by mouth 2 (two) times daily as needed for cough.   . diclofenac sodium (VOLTAREN) 1 % GEL Apply topically 3 (three) times daily as needed (for pain).  Marland Kitchen levothyroxine (SYNTHROID, LEVOTHROID) 125 MCG tablet Take 125 mcg by mouth daily before breakfast.   . meloxicam (MOBIC) 7.5 MG tablet   . olopatadine (PATANOL) 0.1 % ophthalmic solution Place 1 drop into both eyes 2 (two) times daily.   . pantoprazole (PROTONIX) 40 MG tablet TAKE 1 TABLET BY MOUTH EVERY DAY     BP (!) 122/50   Pulse (!) 51   Ht 6' (1.829 m)   BMI 18.58 kg/m   Physical Exam  Constitutional: He is oriented to person, place, and time. He appears well-developed and well-nourished.  Musculoskeletal:       Right knee: He exhibits effusion.  Neurological: He is alert and oriented to person, place, and time. Gait abnormal.  CROUCHED SHUFFLING GAIT   Psychiatric: He has a normal mood and affect. His behavior is normal. Judgment and thought content normal.    Right Knee Exam   Muscle Strength  The patient has normal right knee strength.  Tenderness  The patient is experiencing tenderness in the medial joint line.  Range of Motion  Extension:  10 normal  Flexion:  100 normal   Tests  McMurray:  Medial - negative Lateral - negative Varus: negative Valgus: negative Drawer:  Anterior - negative    Posterior - negative  Other  Erythema: absent Scars: absent Sensation: normal Pulse: present Swelling: none Effusion: effusion present   Left Knee Exam   Muscle Strength  The patient has normal left knee strength.  Tenderness  The patient is experiencing tenderness in the medial joint line.  Range of Motion  Extension:  10 normal  Flexion:  110 normal   Tests  McMurray:  Medial - negative Lateral - negative Varus: negative Valgus: negative Drawer:  Anterior - negative     Posterior - negative  Other  Erythema: absent Scars: absent Sensation: normal Pulse: present Swelling: none        MEDICAL DECISION SECTION    Encounter Diagnoses  Name Primary?  . Chronic pain of both knees Yes  . Effusion, right knee     PLAN: (Rx., injectx, surgery, frx, mri/ct) Right knee effusion bilateral chronic knee pain right knee aspirated 40 cc of clear yellow fluid both knees injected  Procedure note left knee injection verbal consent was obtained to inject left knee joint  Timeout was completed to confirm the site of injection  The medications used were  40 mg of Depo-Medrol and 1% lidocaine 3 cc  Anesthesia was provided by ethyl chloride and the skin was prepped with alcohol.  After cleaning the skin with alcohol a 20-gauge needle was used to inject the left knee joint. There were no complications. A sterile bandage was applied.  Procedure note injection and aspiration right knee joint  Verbal consent was obtained to aspirate and inject the right knee joint   Timeout was completed to confirm the site of aspiration and injection  An 18-gauge needle was used to aspirate the knee joint from a suprapatellar lateral approach.  The medications used were 40 mg of Depo-Medrol and 1% lidocaine 3 cc  Anesthesia was provided by ethyl chloride and the  skin was prepped with alcohol.  After cleaning the skin with alcohol an 18-gauge needle was used to aspirate the right knee joint.  We obtained 40  cc of fluid  We follow this by injection of 40 mg of Depo-Medrol and 3 cc 1% lidocaine.  There were no complications. A sterile bandage was applied.    No orders of the defined types were placed in this encounter.   Arther Abbott, MD  07/05/2018 9:25 AM

## 2018-07-05 NOTE — Telephone Encounter (Signed)
Call from Butch Penny, administrator, [facility] facility - states patient is allergic to Tylenol. Said she needs a D/c order for medication prescribed today, and a new medication if Dr Aline Brochure advises.Ph# [phone].

## 2018-07-13 DIAGNOSIS — D509 Iron deficiency anemia, unspecified: Secondary | ICD-10-CM | POA: Diagnosis not present

## 2018-07-13 DIAGNOSIS — E782 Mixed hyperlipidemia: Secondary | ICD-10-CM | POA: Diagnosis not present

## 2018-07-13 DIAGNOSIS — D530 Protein deficiency anemia: Secondary | ICD-10-CM | POA: Diagnosis not present

## 2018-07-13 DIAGNOSIS — I10 Essential (primary) hypertension: Secondary | ICD-10-CM | POA: Diagnosis not present

## 2018-07-23 ENCOUNTER — Emergency Department (HOSPITAL_COMMUNITY): Payer: Medicare Other

## 2018-07-23 ENCOUNTER — Inpatient Hospital Stay (HOSPITAL_COMMUNITY)
Admission: EM | Admit: 2018-07-23 | Discharge: 2018-07-26 | DRG: 811 | Disposition: A | Discharge status: Hospice | Payer: Medicare Other | Attending: Internal Medicine | Admitting: Internal Medicine

## 2018-07-23 ENCOUNTER — Encounter (HOSPITAL_COMMUNITY): Payer: Self-pay

## 2018-07-23 ENCOUNTER — Other Ambulatory Visit: Payer: Self-pay

## 2018-07-23 DIAGNOSIS — Z85028 Personal history of other malignant neoplasm of stomach: Secondary | ICD-10-CM

## 2018-07-23 DIAGNOSIS — Z66 Do not resuscitate: Secondary | ICD-10-CM | POA: Diagnosis not present

## 2018-07-23 DIAGNOSIS — Z8711 Personal history of peptic ulcer disease: Secondary | ICD-10-CM

## 2018-07-23 DIAGNOSIS — I7100 Dissection of unspecified site of aorta: Secondary | ICD-10-CM | POA: Diagnosis present

## 2018-07-23 DIAGNOSIS — Z8679 Personal history of other diseases of the circulatory system: Secondary | ICD-10-CM

## 2018-07-23 DIAGNOSIS — Z880 Allergy status to penicillin: Secondary | ICD-10-CM

## 2018-07-23 DIAGNOSIS — Z515 Encounter for palliative care: Secondary | ICD-10-CM | POA: Diagnosis present

## 2018-07-23 DIAGNOSIS — Z8249 Family history of ischemic heart disease and other diseases of the circulatory system: Secondary | ICD-10-CM

## 2018-07-23 DIAGNOSIS — I10 Essential (primary) hypertension: Secondary | ICD-10-CM | POA: Diagnosis present

## 2018-07-23 DIAGNOSIS — R42 Dizziness and giddiness: Secondary | ICD-10-CM | POA: Diagnosis not present

## 2018-07-23 DIAGNOSIS — D649 Anemia, unspecified: Secondary | ICD-10-CM

## 2018-07-23 DIAGNOSIS — C329 Malignant neoplasm of larynx, unspecified: Secondary | ICD-10-CM | POA: Diagnosis present

## 2018-07-23 DIAGNOSIS — I6521 Occlusion and stenosis of right carotid artery: Secondary | ICD-10-CM | POA: Diagnosis present

## 2018-07-23 DIAGNOSIS — I2510 Atherosclerotic heart disease of native coronary artery without angina pectoris: Secondary | ICD-10-CM | POA: Diagnosis not present

## 2018-07-23 DIAGNOSIS — K921 Melena: Secondary | ICD-10-CM

## 2018-07-23 DIAGNOSIS — R0902 Hypoxemia: Secondary | ICD-10-CM | POA: Diagnosis not present

## 2018-07-23 DIAGNOSIS — J449 Chronic obstructive pulmonary disease, unspecified: Secondary | ICD-10-CM | POA: Diagnosis present

## 2018-07-23 DIAGNOSIS — R195 Other fecal abnormalities: Secondary | ICD-10-CM | POA: Diagnosis not present

## 2018-07-23 DIAGNOSIS — Z7189 Other specified counseling: Secondary | ICD-10-CM

## 2018-07-23 DIAGNOSIS — R0689 Other abnormalities of breathing: Secondary | ICD-10-CM | POA: Diagnosis not present

## 2018-07-23 DIAGNOSIS — E039 Hypothyroidism, unspecified: Secondary | ICD-10-CM | POA: Diagnosis not present

## 2018-07-23 DIAGNOSIS — R0602 Shortness of breath: Secondary | ICD-10-CM | POA: Diagnosis not present

## 2018-07-23 DIAGNOSIS — Z823 Family history of stroke: Secondary | ICD-10-CM

## 2018-07-23 DIAGNOSIS — Z886 Allergy status to analgesic agent status: Secondary | ICD-10-CM

## 2018-07-23 DIAGNOSIS — I440 Atrioventricular block, first degree: Secondary | ICD-10-CM | POA: Diagnosis not present

## 2018-07-23 DIAGNOSIS — D62 Acute posthemorrhagic anemia: Secondary | ICD-10-CM | POA: Diagnosis not present

## 2018-07-23 DIAGNOSIS — Z888 Allergy status to other drugs, medicaments and biological substances status: Secondary | ICD-10-CM

## 2018-07-23 DIAGNOSIS — Z833 Family history of diabetes mellitus: Secondary | ICD-10-CM

## 2018-07-23 DIAGNOSIS — Z8521 Personal history of malignant neoplasm of larynx: Secondary | ICD-10-CM

## 2018-07-23 DIAGNOSIS — Z87891 Personal history of nicotine dependence: Secondary | ICD-10-CM

## 2018-07-23 LAB — CBC WITH DIFFERENTIAL/PLATELET
Abs Immature Granulocytes: 0.06 10*3/uL (ref 0.00–0.07)
BASOS PCT: 0 %
Basophils Absolute: 0 10*3/uL (ref 0.0–0.1)
EOS ABS: 0.1 10*3/uL (ref 0.0–0.5)
EOS PCT: 0 %
HCT: 20.6 % — ABNORMAL LOW (ref 39.0–52.0)
Hemoglobin: 5.8 g/dL — CL (ref 13.0–17.0)
Immature Granulocytes: 1 %
LYMPHS PCT: 5 %
Lymphs Abs: 0.5 10*3/uL — ABNORMAL LOW (ref 0.7–4.0)
MCH: 22.1 pg — AB (ref 26.0–34.0)
MCHC: 28.2 g/dL — AB (ref 30.0–36.0)
MCV: 78.6 fL — ABNORMAL LOW (ref 80.0–100.0)
MONO ABS: 0.6 10*3/uL (ref 0.1–1.0)
Monocytes Relative: 5 %
NRBC: 0.2 % (ref 0.0–0.2)
Neutro Abs: 10.2 10*3/uL — ABNORMAL HIGH (ref 1.7–7.7)
Neutrophils Relative %: 89 %
PLATELETS: 217 10*3/uL (ref 150–400)
RBC: 2.62 MIL/uL — AB (ref 4.22–5.81)
RDW: 17.8 % — AB (ref 11.5–15.5)
WBC: 11.4 10*3/uL — AB (ref 4.0–10.5)

## 2018-07-23 LAB — POC OCCULT BLOOD, ED: FECAL OCCULT BLD: POSITIVE — AB

## 2018-07-23 LAB — PREPARE RBC (CROSSMATCH)

## 2018-07-23 LAB — TROPONIN I

## 2018-07-23 LAB — BASIC METABOLIC PANEL
ANION GAP: 6 (ref 5–15)
BUN: 53 mg/dL — AB (ref 8–23)
CO2: 19 mmol/L — ABNORMAL LOW (ref 22–32)
Calcium: 7 mg/dL — ABNORMAL LOW (ref 8.9–10.3)
Chloride: 108 mmol/L (ref 98–111)
Creatinine, Ser: 1.03 mg/dL (ref 0.61–1.24)
GFR calc Af Amer: >60 mL/min (ref >60)
GFR, EST NON AFRICAN AMERICAN: 60 mL/min — AB (ref >60)
Glucose, Bld: 159 mg/dL — ABNORMAL HIGH (ref 70–99)
POTASSIUM: 4.7 mmol/L (ref 3.5–5.1)
SODIUM: 133 mmol/L — AB (ref 135–145)

## 2018-07-23 LAB — RETICULOCYTES
Immature Retic Fract: 40.3 % — ABNORMAL HIGH (ref 2.3–15.9)
RBC.: 2.64 MIL/uL — AB (ref 4.22–5.81)
Retic Count, Absolute: 59.9 10*3/uL (ref 19.0–186.0)
Retic Ct Pct: 2.3 % (ref 0.4–3.1)

## 2018-07-23 LAB — IRON AND TIBC
Iron: 15 ug/dL — ABNORMAL LOW (ref 45–182)
SATURATION RATIOS: 4 % — AB (ref 17.9–39.5)
TIBC: 361 ug/dL (ref 250–450)
UIBC: 346 ug/dL

## 2018-07-23 LAB — PROTIME-INR
INR: 1.26
PROTHROMBIN TIME: 15.6 s — AB (ref 11.4–15.2)

## 2018-07-23 LAB — FERRITIN: FERRITIN: 8 ng/mL — AB (ref 24–336)

## 2018-07-23 LAB — ABO/RH: ABO/RH(D): A POS

## 2018-07-23 MED ORDER — LORATADINE 10 MG PO TABS: 10.0000 mg | ORAL_TABLET | Freq: Every day | ORAL | Status: DC

## 2018-07-23 MED ORDER — ACETAMINOPHEN 325 MG PO TABS
650.0000 mg | ORAL_TABLET | Freq: Four times a day (QID) | ORAL | Status: DC | PRN
  Administered 2018-07-26: 650 mg via ORAL
  Filled 2018-07-23: qty 2

## 2018-07-23 MED ORDER — SODIUM CHLORIDE 0.9 % IV SOLN
INTRAVENOUS | Status: AC
  Administered 2018-07-23: 21:00:00 via INTRAVENOUS

## 2018-07-23 MED ORDER — LEVOTHYROXINE SODIUM 125 MCG PO TABS
125.0000 ug | ORAL_TABLET | Freq: Every day | ORAL | Status: DC
  Administered 2018-07-24: 125 ug via ORAL
  Filled 2018-07-23: qty 1

## 2018-07-23 MED ORDER — OLOPATADINE HCL 0.1 % OP SOLN
1.0000 [drp] | Freq: Two times a day (BID) | OPHTHALMIC | Status: DC
  Administered 2018-07-23 – 2018-07-26 (×6): 1 [drp] via OPHTHALMIC
  Filled 2018-07-23: qty 5

## 2018-07-23 MED ORDER — PANTOPRAZOLE SODIUM 40 MG IV SOLR
40.0000 mg | Freq: Two times a day (BID) | INTRAVENOUS | Status: DC
  Administered 2018-07-23 – 2018-07-24 (×4): 40 mg via INTRAVENOUS
  Filled 2018-07-23 (×4): qty 40

## 2018-07-23 MED ORDER — SODIUM CHLORIDE 0.9 % IV SOLN
10.0000 mL/h | Freq: Once | INTRAVENOUS | Status: AC
  Administered 2018-07-23: 10 mL/h via INTRAVENOUS

## 2018-07-23 MED ORDER — ONDANSETRON HCL 4 MG PO TABS: 4.0000 mg | ORAL_TABLET | Freq: Four times a day (QID) | ORAL | Status: DC | PRN

## 2018-07-23 MED ORDER — ONDANSETRON HCL 4 MG/2ML IJ SOLN
4.0000 mg | Freq: Four times a day (QID) | INTRAMUSCULAR | Status: DC | PRN
  Administered 2018-07-24: 4 mg via INTRAVENOUS
  Filled 2018-07-23: qty 2

## 2018-07-23 MED ORDER — MOMETASONE FURO-FORMOTEROL FUM 200-5 MCG/ACT IN AERO
2.0000 | INHALATION_SPRAY | Freq: Two times a day (BID) | RESPIRATORY_TRACT | Status: DC
  Administered 2018-07-23 – 2018-07-24 (×2): 2 via RESPIRATORY_TRACT
  Filled 2018-07-23: qty 8.8

## 2018-07-23 MED ORDER — ACETAMINOPHEN 650 MG RE SUPP: 650.0000 mg | Freq: Four times a day (QID) | RECTAL | Status: DC | PRN

## 2018-07-23 NOTE — ED Provider Notes (Signed)
[facility] EMERGENCY DEPARTMENT Provider Note   CSN: [id number] Arrival date & time: 07/23/18  0737     History   Chief Complaint Chief Complaint  Patient presents with  . Dizziness  . Weakness    HPI Luis Guerra is a 84 y.o. male.  He is brought in by ambulance from his facility for being short of breath.  Said he slept well last evening but when he got out of bed and started walking he was short of breath.  EMS said his heart rate was possibly in the 40s on arrival and they have seen him cough between 60 and 120s.  Patient currently feels back to baseline but he knows if he gets up and walks he would be short of breath again.  It sounds like this is happened before but is not sure why.  There is been minimal cough no fever no chest pain no abdominal pain no vomiting or diarrhea.  Family members here now and said he is low on iron but they elected not to give him any iron infusions due to side effects.  The history is provided by the patient.  Shortness of Breath  This is a recurrent problem. The problem occurs intermittently.The current episode started 1 to 2 hours ago. The problem has been gradually improving. Associated symptoms include cough. Pertinent negatives include no fever, no headaches, no rhinorrhea, no sore throat, no neck pain, no sputum production, no hemoptysis, no wheezing, no chest pain, no syncope, no abdominal pain, no rash, no leg pain and no leg swelling. It is unknown what precipitated the problem. He has tried nothing for the symptoms. The treatment provided no relief. Associated medical issues include COPD.    Past Medical History:  Diagnosis Date  . AAA (abdominal aortic aneurysm) ([place])   . Cancer ([place])    stomach cancer  . Carotid occlusion, right   . COPD (chronic obstructive pulmonary disease) ([place])   . Coronary artery disease    WITH HISTORY OF OCCLUSION OF A DIAGONAL BRANCH  . Dysphagia   . Gastritis   . Hiatal hernia   . Hypothyroidism   .  SBO (small bowel obstruction) ([place])   . Weight loss     Patient Active Problem List   Diagnosis Date Noted  . Arthritis of knee 07/17/2013  . Weight loss 03/12/2013  . Sinus bradycardia 01/16/2013  . Lightheaded 01/14/2013  . Lightheadedness 01/13/2013  . Ataxia 01/13/2013  . Elevated transaminase level 01/13/2013  . History of gastric cancer 09/17/2012  . Abdominal pain 07/17/2012  . Knee pain 12/07/2011  . OA (osteoarthritis) of knee 12/07/2011  . Coronary artery disease   . Carotid occlusion, right   . INTERDIGITAL NEUROMA 04/10/2009  . JOINT EFFUSION, LEFT KNEE 04/10/2009  . DEGENERATIVE JOINT DISEASE, RIGHT KNEE 02/05/2009  . OSTEOARTHRITIS, LOWER LEG 01/30/2009  . DERANGEMENT OF POSTERIOR HORN OF MEDIAL MENISCUS 01/30/2009  . BUCKET HANDLE TEAR OF LATERAL MENISCUS 01/30/2009  . DERANGEMENT MENISCUS 01/30/2009  . Pain in joint, lower leg 01/30/2009    Past Surgical History:  Procedure Laterality Date  . ABDOMINAL AORTIC ANEURYSM REPAIR    . BACK SURGERY    . BALLOON DILATION  05/26/2011   Procedure: BALLOON DILATION;  Surgeon: Rogene Houston, MD;  [facility];  Service: Endoscopy;  Laterality: N/A;  . CARDIAC CATHETERIZATION  06/08/2007   EF 55%  . CARDIOVASCULAR STRESS TEST  05/03/2006   EF 60%  . CHOLECYSTECTOMY    .  COLONOSCOPY  07/22/2010  . ESOPHAGOGASTRODUODENOSCOPY  05/26/2011   Procedure: ESOPHAGOGASTRODUODENOSCOPY (EGD);  Surgeon: Rogene Houston, MD;  [facility];  Service: Endoscopy;  Laterality: N/A;  3:00  . ESOPHAGOGASTRODUODENOSCOPY  07/21/2012   Procedure: ESOPHAGOGASTRODUODENOSCOPY (EGD);  Surgeon: Rogene Houston, MD;  [facility];  Service: Endoscopy;  Laterality: N/A;  325-changed to 225 Ann to notify pt  . HERNIA REPAIR    . low back surgery    . UPPER GASTROINTESTINAL ENDOSCOPY  07/13/06  . UPPER GASTROINTESTINAL ENDOSCOPY  02/19/05  . UPPER GASTROINTESTINAL ENDOSCOPY  07/22/2010   EGD ED        Home  Medications    Prior to Admission medications   Medication Sig Start Date End Date Taking? Authorizing Provider  acetaminophen (TYLENOL) 500 MG tablet Take 500 mg by mouth every 8 (eight) hours as needed for mild pain or moderate pain.     [provider]  benzonatate (TESSALON) 100 MG capsule Take 100 mg by mouth 2 (two) times daily as needed for cough.     [provider]  diclofenac sodium (VOLTAREN) 1 % GEL Apply topically 3 (three) times daily as needed (for pain).    [provider]  fexofenadine (ALLEGRA) 180 MG tablet Take 180 mg by mouth daily. 09/04/15   [provider]  Fluticasone-Salmeterol (ADVAIR) 250-50 MCG/DOSE AEPB Inhale 1 puff into the lungs 2 (two) times daily.     [provider]  levothyroxine (SYNTHROID, LEVOTHROID) 125 MCG tablet Take 125 mcg by mouth daily before breakfast.  02/09/18   [provider]  losartan (COZAAR) 25 MG tablet Take 0.5 tablets (12.5 mg total) by mouth daily. Patient not taking: Reported on 07/05/2018 10/29/16   Luis Lenis, MD  meloxicam [facility]) 7.5 MG tablet  06/15/18   [provider]  olopatadine (PATANOL) 0.1 % ophthalmic solution Place 1 drop into both eyes 2 (two) times daily.  10/13/15   [provider]  pantoprazole (PROTONIX) 40 MG tablet TAKE 1 TABLET BY MOUTH EVERY DAY 09/04/15   Rehman, Mechele Dawley, MD    Family History Family History  Problem Relation Age of Onset  . Heart disease Father   . Healthy Sister   . Heart disease Sister   . Stroke Sister   . Stroke Daughter   . Diabetes Daughter   . Healthy Son     Social History Social History   Tobacco Use  . Smoking status: Former Smoker    Last attempt to quit: 08/30/1957    Years since quitting: 60.9  . Smokeless tobacco: Never Used  . Tobacco comment: Quit smoking in 1959  Substance Use Topics  . Alcohol use: No    Alcohol/week: 0.0 standard drinks  . Drug use: No     Allergies   Aspirin;  Esomeprazole magnesium; Niacin; and Penicillins   Review of Systems Review of Systems  Constitutional: Negative for fever.  HENT: Negative for rhinorrhea and sore throat.   Eyes: Negative for visual disturbance.  Respiratory: Positive for cough and shortness of breath. Negative for hemoptysis, sputum production and wheezing.   Cardiovascular: Negative for chest pain, leg swelling and syncope.  Gastrointestinal: Negative for abdominal pain.  Genitourinary: Negative for dysuria.  Musculoskeletal: Negative for neck pain.  Skin: Positive for pallor. Negative for rash.  Neurological: Positive for dizziness and light-headedness. Negative for headaches.     Physical Exam Updated Vital Signs BP (!) 104/55   Pulse 71   Temp (!)  97.5 F (36.4 C) (Oral)   Resp 16   Ht 6' (1.829 m)   Wt 62.6 kg   SpO2 95%   BMI 18.72 kg/m   Physical Exam  Constitutional: He appears well-developed and well-nourished.  HENT:  Head: Normocephalic and atraumatic.  Eyes: Conjunctivae are normal.  Neck: Neck supple.  Cardiovascular: Normal rate, regular rhythm and normal heart sounds.  No murmur heard. Pulmonary/Chest: Effort normal and breath sounds normal. No respiratory distress. He has no wheezes.  Abdominal: Soft. He exhibits no mass. There is no tenderness. There is no guarding.  Musculoskeletal: Normal range of motion. He exhibits no edema, tenderness or deformity.  Neurological: He is alert.  Skin: Skin is warm and dry. There is pallor.  Psychiatric: He has a normal mood and affect.  Nursing note and vitals reviewed.    ED Treatments / Results  Labs (all labs ordered are listed, but only abnormal results are displayed) Labs Reviewed  CBC WITH DIFFERENTIAL/PLATELET - Abnormal; Notable for the following components:      Result Value   WBC 11.4 (*)    RBC 2.62 (*)    Hemoglobin 5.8 (*)    HCT 20.6 (*)    MCV 78.6 (*)    MCH 22.1 (*)    MCHC 28.2 (*)    RDW 17.8 (*)    Neutro Abs 10.2  (*)    Lymphs Abs 0.5 (*)    All other components within normal limits  BASIC METABOLIC PANEL - Abnormal; Notable for the following components:   Sodium 133 (*)    CO2 19 (*)    Glucose, Bld 159 (*)    BUN 53 (*)    Calcium 7.0 (*)    GFR calc non Af Amer 60 (*)    All other components within normal limits  PROTIME-INR - Abnormal; Notable for the following components:   Prothrombin Time 15.6 (*)    All other components within normal limits  IRON AND TIBC - Abnormal; Notable for the following components:   Iron 15 (*)    Saturation Ratios 4 (*)    All other components within normal limits  FERRITIN - Abnormal; Notable for the following components:   Ferritin 8 (*)    All other components within normal limits  RETICULOCYTES - Abnormal; Notable for the following components:   RBC. 2.64 (*)    Immature Retic Fract 40.3 (*)    All other components within normal limits  POC OCCULT BLOOD, ED - Abnormal; Notable for the following components:   Fecal Occult Bld POSITIVE (*)    All other components within normal limits  MRSA PCR SCREENING  TROPONIN I  URINALYSIS, ROUTINE W REFLEX MICROSCOPIC  CBC  BASIC METABOLIC PANEL  TYPE AND SCREEN  PREPARE RBC (CROSSMATCH)  ABO/RH    EKG EKG Interpretation  Date/Time:  Sunday July 23 2018 09:48:17 EST Ventricular Rate:  71 PR Interval:    QRS Duration: 99 QT Interval:  402 QTC Calculation: 437 R Axis:   -26 Text Interpretation:  Sinus rhythm Borderline prolonged PR interval Inferior infarct, old Anterior infarct, old Baseline wander in lead(s) V1 V2 V3 V5 poor baseline similar to prior 3/18 Confirmed by Aletta Edouard [phone]) on 07/23/2018 9:54:00 AM   Radiology Dg Chest Portable 1 View  Result Date: 07/23/2018 CLINICAL DATA:  Shortness of breath and dizziness beginning 1 hour ago, history of stomach cancer, COPD, coronary artery disease, hiatal hernia EXAM: PORTABLE CHEST 1 VIEW COMPARISON:  Portable exam  1008 hours compared to  03/31/2018 FINDINGS: Upper normal heart size. Tortuous and question aneurysmal descending thoracic aorta with mild atherosclerotic calcification. Chronic peribronchial thickening with mild chronic accentuation of pulmonary markings. Lungs otherwise clear. No infiltrate, pleural effusion or pneumothorax. Bones demineralized. 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 Bronchitic changes with chronic accentuation of pulmonary markings. No acute abnormalities. Electronically Signed   By: Lavonia Dana M.D.   On: 07/23/2018 10:34    Procedures .Critical Care Performed by: Hayden Rasmussen, MD Authorized by: Hayden Rasmussen, MD   Critical care provider statement:    Critical care time (minutes):  45   Critical care time was exclusive of:  Separately billable procedures and treating other patients   Critical care was necessary to treat or prevent imminent or life-threatening deterioration of the following conditions:  Circulatory failure   Critical care was time spent personally by me on the following activities:  Discussions with consultants, evaluation of patient's response to treatment, examination of patient, ordering and performing treatments and interventions, ordering and review of laboratory studies, ordering and review of radiographic studies, pulse oximetry, re-evaluation of patient's condition, obtaining history from patient or surrogate, review of old charts and development of treatment plan with patient or surrogate   I assumed direction of critical care for this patient from another provider in my specialty: no     (including critical care time)  Medications Ordered in ED Medications - No data to display   Initial Impression / Assessment and Plan / ED Course  I have reviewed the triage vital signs and the nursing notes.  Pertinent labs & imaging results that were available during my care  of the patient were reviewed by me and considered in my medical decision making (see chart for details).  Clinical Course as of Jul 23 1713  Sun Jul 23, 2018  1050 Patient's hemoglobin came back quite low.  He will need a blood transfusion.  The son is his power of attorney and is agreeable to transfusion.  In discussion with him he does not want CPR or intubation.  They are agreeable to transfusion and admission to the hospital.  Rectal exam done normal tone no gross blood.  Guaiac sent to lab for testing.Chaperone was present during exam.    [MB]  [id number] Discussed with Dr. Carles Collet from the hospitalist service will evaluate in the ED for admission.   [MB]    Clinical Course User Index [MB] Hayden Rasmussen, MD      Final Clinical Impressions(s) / ED Diagnoses   Final diagnoses:  Symptomatic anemia  Guaiac positive stools    ED Discharge Orders    None       Hayden Rasmussen, MD 07/23/18 1715

## 2018-07-23 NOTE — ED Notes (Signed)
CRITICAL VALUE ALERT  Critical Value:  Hemoglobin 5.8  Date & Time Notied:  07/23/2018  Provider Notified: Dr. Melina Copa  Orders Received/Actions taken: See chart

## 2018-07-23 NOTE — H&P (Signed)
History and Physical  BECKY BERBERIAN TIR:[id number] DOB: Dec 03, 1920 DOA: 07/23/2018   PCP: Pablo Lawrence, NP   Patient coming from: Home  Chief Complaint: generalized weakness  HPI:  Luis Guerra is a 84 y.o. male with medical history of abdominal aortic aneurysm, laryngeal cancer, coronary disease, gastric lymph-epithelioma, hypertension, hypothyroidism presenting with generalized weakness and shortness of breath.  The patient had been in his usual state of health until the morning of 07/23/2018.  The patient got up to go to the bathroom and washed up.  He stated that he had much difficulty getting up out of bed and felt dizzy and has some shortness of breath.  He subsequently contacted the administrator at [facility], the patient was subsequently brought to the hospital for further evaluation.  The patient denied any chest pain or syncope.  He stated he had one episode of nausea and vomiting with coffee-ground emesis.  In addition, the patient has had some intermittent melena for this past week.  He denies any fevers, chills, dysuria, hematuria, abdominal pain, hematochezia.  The patient has been taking meloxicam on a daily basis.  He denies any other NSAIDs.  Upon presentation, the patient was afebrile with soft blood pressures in the upper 80s.  Hemoglobin was noted to be 5.8.  WBC was 11.4.  BMP showed sodium 133 but was otherwise unremarkable.  EKG shows sinus rhythm without ST ST wave changes.  Hemoccult was positive without any bright red blood.  Assessment/Plan: Acute blood loss anemia/melena/symptomatic anemia -transfuse 2 units PRBCs -d/c meloxicam -gentle IVF -consult GI -INR 1.26 -IV Protonix twice daily -Close liquid diet for now -check iron studies  Essential hypertension -Holding losartan secondary to soft blood pressure -Most recent blood pressure in the emergency department 117/62  Hypothyroidism -Continue Synthroid  Aortic dissection - admitted to [facility]  09/2016 with abdominal pain and HTN with SBPs in 200s. 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  CAD  - last cath 2008 with occluded D2, has been medically managed  - he is not on ASA because of allergy. C -also previously on plavix; not currently on any antiplatelet agents -no chest pain  Laryngeal cancer -Diagnosed approximately 2 years prior to this admission -Received radiation -Currently in remission  COPD -Presently stable on room air -Continue LABA     Past Medical History:  Diagnosis Date  . AAA (abdominal aortic aneurysm) ([place])   . Cancer ([place])    stomach cancer  . Carotid occlusion, right   . COPD (chronic obstructive pulmonary disease) ([place])   . Coronary artery disease    WITH HISTORY OF OCCLUSION OF A DIAGONAL BRANCH  . Dysphagia   . Gastritis   . Hiatal hernia   . Hypothyroidism   . SBO (small bowel obstruction) ([place])   . Weight loss    Past Surgical History:  Procedure Laterality Date  . ABDOMINAL AORTIC ANEURYSM REPAIR    . BACK SURGERY    . BALLOON DILATION  05/26/2011   Procedure: BALLOON DILATION;  Surgeon: Rogene Houston, MD;  [facility];  Service: Endoscopy;  Laterality: N/A;  . CARDIAC CATHETERIZATION  06/08/2007   EF 55%  . CARDIOVASCULAR STRESS TEST  05/03/2006   EF 60%  . CHOLECYSTECTOMY    . COLONOSCOPY  07/22/2010  . ESOPHAGOGASTRODUODENOSCOPY  05/26/2011   Procedure: ESOPHAGOGASTRODUODENOSCOPY (EGD);  Surgeon: Rogene Houston, MD;  [facility];  Service: Endoscopy;  Laterality: N/A;  3:00  . ESOPHAGOGASTRODUODENOSCOPY  07/21/2012   Procedure: ESOPHAGOGASTRODUODENOSCOPY (EGD);  Surgeon: Rogene Houston, MD;  [facility];  Service: Endoscopy;  Laterality: N/A;  325-changed to 225 Ann to notify pt  . HERNIA REPAIR    . low back surgery    . UPPER GASTROINTESTINAL ENDOSCOPY   07/13/06  . UPPER GASTROINTESTINAL ENDOSCOPY  02/19/05  . UPPER GASTROINTESTINAL ENDOSCOPY  07/22/2010   EGD ED   Social History:  reports that he quit smoking about 60 years ago. He has never used smokeless tobacco. He reports that he does not drink alcohol or use drugs.   Family History  Problem Relation Age of Onset  . Heart disease Father   . Healthy Sister   . Heart disease Sister   . Stroke Sister   . Stroke Daughter   . Diabetes Daughter   . Healthy Son      Allergies  Allergen Reactions  . Aspirin Swelling  . Esomeprazole Magnesium Diarrhea  . Niacin Swelling  . Penicillins Swelling    Has patient had a PCN reaction causing immediate rash, facial/tongue/throat swelling, SOB or lightheadedness with hypotension: YES Has patient had a PCN reaction causing severe rash involving mucus membranes or skin necrosis: NO Has patient had a PCN reaction that required hospitalization: NO Has patient had a PCN reaction occurring within the last 10 years: NO If all of the above answers are "NO", then may proceed with Cephalosporin use.      Prior to Admission medications   Medication Sig Start Date End Date Taking? Authorizing Provider  acetaminophen (TYLENOL) 500 MG tablet Take 500 mg by mouth every 8 (eight) hours as needed for mild pain or moderate pain.     [provider]  benzonatate (TESSALON) 100 MG capsule Take 100 mg by mouth 2 (two) times daily as needed for cough.     [provider]  diclofenac sodium (VOLTAREN) 1 % GEL Apply topically 3 (three) times daily as needed (for pain).    [provider]  fexofenadine (ALLEGRA) 180 MG tablet Take 180 mg by mouth daily. 09/04/15   [provider]  Fluticasone-Salmeterol (ADVAIR) 250-50 MCG/DOSE AEPB Inhale 1 puff into the lungs 2 (two) times daily.     [provider]  levothyroxine (SYNTHROID, LEVOTHROID) 125 MCG tablet Take 125 mcg by mouth daily before breakfast.  02/09/18    [provider]  losartan (COZAAR) 25 MG tablet Take 0.5 tablets (12.5 mg total) by mouth daily. Patient not taking: Reported on 07/05/2018 10/29/16   Arnoldo Lenis, MD  meloxicam [facility]) 7.5 MG tablet  06/15/18   [provider]  olopatadine (PATANOL) 0.1 % ophthalmic solution Place 1 drop into both eyes 2 (two) times daily.  10/13/15   [provider]  pantoprazole (PROTONIX) 40 MG tablet TAKE 1 TABLET BY MOUTH EVERY DAY 09/04/15   Rehman, Mechele Dawley, MD    Review of Systems:  Constitutional:  No weight loss, night sweats, Fevers, chills Head&Eyes: No headache.  No vision loss.  No eye pain or scotoma ENT:  No Difficulty swallowing,Tooth/dental problems,Sore throat,  No ear ache, post nasal drip,  Cardio-vascular:  No chest pain, Orthopnea, PND, swelling in lower extremities,  dizziness, palpitations  GI:  diarrhea, loss of appetite, hematochezia, melena, heartburn, indigestion, Resp:   No cough. No coughing up of blood .No wheezing.No chest wall deformity  Skin:  no rash or lesions.  GU:  no dysuria, change in color of urine, no urgency or frequency. No flank pain.  Musculoskeletal:  No joint pain or swelling. No decreased range of motion. No back pain.  Psych:  No change in mood or affect. No depression or anxiety. Neurologic: No headache, no dysesthesia, no focal weakness, no vision loss. No syncope  Physical Exam: Vitals:   07/23/18 1000 07/23/18 1030 07/23/18 1100 07/23/18 1130  BP: (!) 89/52 (!) 84/50 (!) 116/59 117/62  Pulse:    63  Resp: 11 14 10 12   Temp:      TempSrc:      SpO2:   95% 94%  Weight:      Height:       General:  A&O x 3, NAD, nontoxic, pleasant/cooperative Head/Eye: No conjunctival hemorrhage, no icterus, [place]/AT, No nystagmus ENT:  No icterus,  No thrush, good dentition, no pharyngeal exudate Neck:  No masses, no lymphadenpathy, no bruits CV:  RRR, no rub, no gallop, no S3 Lung: Diminished breath sounds.  No wheezing.   Otherwise clear to auscultation Abdomen: soft/NT, +BS, nondistended, no peritoneal signs Ext: No cyanosis, No rashes, No petechiae, No lymphangitis, No edema Neuro: CNII-XII intact, strength 4/5 in bilateral upper and lower extremities, no dysmetria  Labs on Admission:  Basic Metabolic Panel: Recent Labs  Lab 07/23/18 0956  NA 133*  K 4.7  CL 108  CO2 19*  GLUCOSE 159*  BUN 53*  CREATININE 1.03  CALCIUM 7.0*   Liver Function Tests: No results for input(s): AST, ALT, ALKPHOS, BILITOT, PROT, ALBUMIN in the last 168 hours. No results for input(s): LIPASE, AMYLASE in the last 168 hours. No results for input(s): AMMONIA in the last 168 hours. CBC: Recent Labs  Lab 07/23/18 0956  WBC 11.4*  NEUTROABS 10.2*  HGB 5.8*  HCT 20.6*  MCV 78.6*  PLT 217   Coagulation Profile: Recent Labs  Lab 07/23/18 0956  INR 1.26   Cardiac Enzymes: Recent Labs  Lab 07/23/18 0956  TROPONINI <0.03   BNP: Invalid input(s): POCBNP CBG: No results for input(s): GLUCAP in the last 168 hours. Urine analysis:    Component Value Date/Time   COLORURINE YELLOW 03/15/2018 1709   APPEARANCEUR CLEAR 03/15/2018 1709   LABSPEC 1.011 03/15/2018 1709   PHURINE 6.0 03/15/2018 1709   GLUCOSEU NEGATIVE 03/15/2018 1709   HGBUR NEGATIVE 03/15/2018 1709   BILIRUBINUR NEGATIVE 03/15/2018 1709   KETONESUR NEGATIVE 03/15/2018 1709   PROTEINUR NEGATIVE 03/15/2018 1709   UROBILINOGEN 0.2 09/09/2014 1914   NITRITE NEGATIVE 03/15/2018 1709   LEUKOCYTESUR NEGATIVE 03/15/2018 1709   Sepsis Labs: @LABRCNTIP (procalcitonin:4,lacticidven:4) )No results found for this or any previous visit (from the past 240 hour(s)).   Radiological Exams on Admission: Dg Chest Portable 1 View  Result Date: 07/23/2018 CLINICAL DATA:  Shortness of breath and dizziness beginning 1 hour ago, history of stomach cancer, COPD, coronary artery disease, hiatal hernia EXAM: PORTABLE CHEST 1 VIEW COMPARISON:  Portable exam 1008 hours  compared to 03/31/2018 FINDINGS: Upper normal heart size. Tortuous and question aneurysmal descending thoracic aorta with mild atherosclerotic calcification. Chronic peribronchial thickening with mild chronic accentuation of pulmonary markings. Lungs otherwise clear. No infiltrate, pleural effusion or pneumothorax. Bones demineralized. 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 Bronchitic changes with chronic accentuation of pulmonary markings. No acute abnormalities. Electronically Signed   By: Lavonia Dana M.D.   On: 07/23/2018 10:34  EKG: Independently reviewed.  Sinus rhythm, no ST-T wave changes    Time spent:60 minutes Code Status:   DNR Family Communication:  Son updated at bedside Disposition Plan: expect 1-2 day hospitalization Consults called: GI DVT Prophylaxis: SCDs  Orson Eva, DO  [facility] Pager [phone]  If 7PM-7AM, please contact night-coverage [email or web address] Password [facility] 07/23/2018, 11:52 AM

## 2018-07-23 NOTE — Progress Notes (Signed)
Stated had to have bowel movement. Daughter and son stated he walks with walker at nursing home but as I was holding him up, he seemed to stumble and I had to sit him on the bed side toilet. Took about 15 deep breaths and seemed to pass out I was holding him at this time. About 30 seconds latter awaken and had a dark black stool mushy consistency. No frank blood noted. Had no difficulty getting back in bed.

## 2018-07-23 NOTE — ED Notes (Signed)
Pt's son who is POA signed blood consent for pt.  Pt verbally agreed to this as well.

## 2018-07-23 NOTE — ED Triage Notes (Signed)
Pt resident of [facility].   EMS says pt was walking down the hall and became sob and dizzy approx 1 hour ago.  FD arrived first and reports HR was 40.  EMS says HR has varied from 60's-120's.  Reports pt has history of low iron and was taken off of his bp medication over 1 month ago.   CBG 99 bp 116/54.

## 2018-07-24 DIAGNOSIS — Z515 Encounter for palliative care: Secondary | ICD-10-CM | POA: Diagnosis present

## 2018-07-24 DIAGNOSIS — D649 Anemia, unspecified: Secondary | ICD-10-CM | POA: Diagnosis not present

## 2018-07-24 DIAGNOSIS — I7100 Dissection of unspecified site of aorta: Secondary | ICD-10-CM | POA: Diagnosis present

## 2018-07-24 DIAGNOSIS — Z7189 Other specified counseling: Secondary | ICD-10-CM

## 2018-07-24 DIAGNOSIS — Z66 Do not resuscitate: Secondary | ICD-10-CM | POA: Diagnosis present

## 2018-07-24 DIAGNOSIS — K921 Melena: Secondary | ICD-10-CM | POA: Diagnosis present

## 2018-07-24 DIAGNOSIS — I2510 Atherosclerotic heart disease of native coronary artery without angina pectoris: Secondary | ICD-10-CM | POA: Diagnosis present

## 2018-07-24 DIAGNOSIS — Z87891 Personal history of nicotine dependence: Secondary | ICD-10-CM | POA: Diagnosis not present

## 2018-07-24 DIAGNOSIS — J449 Chronic obstructive pulmonary disease, unspecified: Secondary | ICD-10-CM | POA: Diagnosis present

## 2018-07-24 DIAGNOSIS — Z8711 Personal history of peptic ulcer disease: Secondary | ICD-10-CM | POA: Diagnosis not present

## 2018-07-24 DIAGNOSIS — Z888 Allergy status to other drugs, medicaments and biological substances status: Secondary | ICD-10-CM | POA: Diagnosis not present

## 2018-07-24 DIAGNOSIS — I6521 Occlusion and stenosis of right carotid artery: Secondary | ICD-10-CM | POA: Diagnosis present

## 2018-07-24 DIAGNOSIS — Z8521 Personal history of malignant neoplasm of larynx: Secondary | ICD-10-CM | POA: Diagnosis not present

## 2018-07-24 DIAGNOSIS — Z880 Allergy status to penicillin: Secondary | ICD-10-CM | POA: Diagnosis not present

## 2018-07-24 DIAGNOSIS — Z833 Family history of diabetes mellitus: Secondary | ICD-10-CM | POA: Diagnosis not present

## 2018-07-24 DIAGNOSIS — Z85028 Personal history of other malignant neoplasm of stomach: Secondary | ICD-10-CM | POA: Diagnosis not present

## 2018-07-24 DIAGNOSIS — Z823 Family history of stroke: Secondary | ICD-10-CM | POA: Diagnosis not present

## 2018-07-24 DIAGNOSIS — I10 Essential (primary) hypertension: Secondary | ICD-10-CM | POA: Diagnosis present

## 2018-07-24 DIAGNOSIS — Z886 Allergy status to analgesic agent status: Secondary | ICD-10-CM | POA: Diagnosis not present

## 2018-07-24 DIAGNOSIS — C329 Malignant neoplasm of larynx, unspecified: Secondary | ICD-10-CM | POA: Diagnosis present

## 2018-07-24 DIAGNOSIS — R195 Other fecal abnormalities: Secondary | ICD-10-CM | POA: Diagnosis not present

## 2018-07-24 DIAGNOSIS — Z8249 Family history of ischemic heart disease and other diseases of the circulatory system: Secondary | ICD-10-CM | POA: Diagnosis not present

## 2018-07-24 DIAGNOSIS — Z8679 Personal history of other diseases of the circulatory system: Secondary | ICD-10-CM | POA: Diagnosis not present

## 2018-07-24 DIAGNOSIS — I712 Thoracic aortic aneurysm, without rupture: Secondary | ICD-10-CM | POA: Diagnosis not present

## 2018-07-24 DIAGNOSIS — C169 Malignant neoplasm of stomach, unspecified: Secondary | ICD-10-CM | POA: Diagnosis not present

## 2018-07-24 DIAGNOSIS — D62 Acute posthemorrhagic anemia: Secondary | ICD-10-CM | POA: Diagnosis present

## 2018-07-24 DIAGNOSIS — E039 Hypothyroidism, unspecified: Secondary | ICD-10-CM | POA: Diagnosis present

## 2018-07-24 LAB — BASIC METABOLIC PANEL
ANION GAP: 7 (ref 5–15)
BUN: 71 mg/dL — ABNORMAL HIGH (ref 8–23)
CALCIUM: 7.4 mg/dL — AB (ref 8.9–10.3)
CHLORIDE: 110 mmol/L (ref 98–111)
CO2: 20 mmol/L — AB (ref 22–32)
CREATININE: 1.16 mg/dL (ref 0.61–1.24)
GFR calc non Af Amer: 52 mL/min — ABNORMAL LOW (ref >60)
GFR, EST AFRICAN AMERICAN: 60 mL/min — AB (ref >60)
Glucose, Bld: 135 mg/dL — ABNORMAL HIGH (ref 70–99)
Potassium: 4.2 mmol/L (ref 3.5–5.1)
Sodium: 137 mmol/L (ref 135–145)

## 2018-07-24 LAB — URINALYSIS, ROUTINE W REFLEX MICROSCOPIC
BILIRUBIN URINE: NEGATIVE
GLUCOSE, UA: NEGATIVE mg/dL
HGB URINE DIPSTICK: NEGATIVE
Ketones, ur: 5 mg/dL — AB
Leukocytes, UA: NEGATIVE
Nitrite: NEGATIVE
Protein, ur: NEGATIVE mg/dL
SPECIFIC GRAVITY, URINE: 1.016 (ref 1.005–1.030)
pH: 5 (ref 5.0–8.0)

## 2018-07-24 LAB — CBC
HEMATOCRIT: 32.1 % — AB (ref 39.0–52.0)
HEMOGLOBIN: 9.6 g/dL — AB (ref 13.0–17.0)
MCH: 25.5 pg — ABNORMAL LOW (ref 26.0–34.0)
MCHC: 29.9 g/dL — AB (ref 30.0–36.0)
MCV: 85.1 fL (ref 80.0–100.0)
NRBC: 0.5 % — AB (ref 0.0–0.2)
Platelets: 124 10*3/uL — ABNORMAL LOW (ref 150–400)
RBC: 3.77 MIL/uL — ABNORMAL LOW (ref 4.22–5.81)
RDW: 19.9 % — AB (ref 11.5–15.5)
WBC: 15.4 10*3/uL — AB (ref 4.0–10.5)

## 2018-07-24 LAB — MRSA PCR SCREENING: MRSA BY PCR: NEGATIVE

## 2018-07-24 LAB — HEMOGLOBIN AND HEMATOCRIT, BLOOD
HEMATOCRIT: 36.8 % — AB (ref 39.0–52.0)
HEMATOCRIT: 19.8 % — AB (ref 39.0–52.0)
HEMOGLOBIN: 5.8 g/dL — AB (ref 13.0–17.0)
Hemoglobin: 10.8 g/dL — ABNORMAL LOW (ref 13.0–17.0)

## 2018-07-24 LAB — PREPARE RBC (CROSSMATCH)

## 2018-07-24 MED ORDER — MORPHINE SULFATE (PF) 2 MG/ML IV SOLN
2.0000 mg | INTRAVENOUS | Status: DC | PRN
  Administered 2018-07-25 (×2): 2 mg via INTRAVENOUS
  Filled 2018-07-24 (×2): qty 1

## 2018-07-24 MED ORDER — ORAL CARE MOUTH RINSE
15.0000 mL | Freq: Two times a day (BID) | OROMUCOSAL | Status: DC
  Administered 2018-07-24 – 2018-07-26 (×5): 15 mL via OROMUCOSAL

## 2018-07-24 MED ORDER — METOCLOPRAMIDE HCL 5 MG/ML IJ SOLN
10.0000 mg | Freq: Four times a day (QID) | INTRAMUSCULAR | Status: DC
  Administered 2018-07-24 – 2018-07-26 (×9): 10 mg via INTRAVENOUS
  Filled 2018-07-24 (×9): qty 2

## 2018-07-24 MED ORDER — ONDANSETRON HCL 4 MG/2ML IJ SOLN
4.0000 mg | Freq: Four times a day (QID) | INTRAMUSCULAR | Status: DC
  Administered 2018-07-24 – 2018-07-26 (×9): 4 mg via INTRAVENOUS
  Filled 2018-07-24 (×9): qty 2

## 2018-07-24 MED ORDER — PROMETHAZINE HCL 25 MG/ML IJ SOLN
12.5000 mg | Freq: Once | INTRAMUSCULAR | Status: AC
  Administered 2018-07-24: 12.5 mg via INTRAVENOUS
  Filled 2018-07-24: qty 1

## 2018-07-24 MED ORDER — SODIUM CHLORIDE 0.9% IV SOLUTION
Freq: Once | INTRAVENOUS | Status: AC
  Administered 2018-07-24: 01:00:00 via INTRAVENOUS

## 2018-07-24 NOTE — Consult Note (Signed)
Referring Provider: Orson Eva, DO Primary Care Physician:  Pablo Lawrence, NP Primary Gastroenterologist:  Dr. Laural Golden  Reason for Consultation:    Upper GI bleed.  HPI:   Reason provided by his son Luis Guerra who is at bedside.  Patient is a 84 year old Caucasian male with multiple medical problems including history of remote surgery for peptic ulcer disease chronic GERD complicated by esophageal stricture as well as history of gastric adenocarcinoma which was diagnosed in September 2012 and he underwent endoscopic removal in October 2012 at [facility].  He had follow-up EGD in February 2013 and again in November 2013 and there was no recurrence.  He was last seen in the office in January 2017 and was doing well. He has not been doing well over the last few years.  Since February this year he is been cared for at [facility] and has been doing well.  Patient has been having tarry stools over the last few weeks.  Yesterday when he went to the dining hall to eat lunch he complained of feeling nauseated and weak.  He was not able to eat.  As he walked back to his room he felt lightheaded and short of breath.  Patient was therefore brought to emergency room for further evaluation.  His hemoglobin was noted to be 5.8 g.  While in the emergency room he had emesis of coffee-ground material.  Patient was admitted to ICU.  He received 2 units of PRBCs.  His hemoglobin was still 5.8 g.  He received 2 more units of PRBCs his hemoglobin is up to 5.6 g.  However he has had coffee-ground emesis and according to his son he has been passing blood and clots per rectum. Patient has some knee problems and has been taking meloxicam although he may not have been taking daily recently he was seen by Dr. Orie Rout and had fluid removed and possibly received intra-articular steroid. Patient has been gradually losing weight although he has leveled off lately. He has had problems with constipation and once he was treated  for fecal impaction in the emergency room but lately his bowels have been moving regularly.  According to his son he was checked in the emergency room and was not impacted. Patient denies abdominal pain.  He also denies chest pain or shortness of breath. He has told his son and other family members that he is ready to move on and does not want any testing done.   Past Medical History:  Diagnosis Date  . AAA (abdominal aortic aneurysm) ([place])   . Cancer ([place])    stomach cancer  . Carotid occlusion, right   . COPD (chronic obstructive pulmonary disease) ([place])   . Coronary artery disease    WITH HISTORY OF OCCLUSION OF A DIAGONAL BRANCH  . Dysphagia   . Gastritis   . Hiatal hernia   . Hypothyroidism   . SBO (small bowel obstruction) ([place])   . Weight loss     Past Surgical History:  Procedure Laterality Date  . ABDOMINAL AORTIC ANEURYSM REPAIR    . BACK SURGERY    . BALLOON DILATION  05/26/2011   Procedure: BALLOON DILATION;  Surgeon: Rogene Houston, MD;  [facility];  Service: Endoscopy;  Laterality: N/A;  . CARDIAC CATHETERIZATION  06/08/2007   EF 55%  . CARDIOVASCULAR STRESS TEST  05/03/2006   EF 60%  . CHOLECYSTECTOMY    . COLONOSCOPY  07/22/2010  . ESOPHAGOGASTRODUODENOSCOPY  05/26/2011   Procedure: ESOPHAGOGASTRODUODENOSCOPY (EGD);  Surgeon: Rogene Houston, MD;  [facility];  Service: Endoscopy;  Laterality: N/A;  3:00  . ESOPHAGOGASTRODUODENOSCOPY  07/21/2012   Procedure: ESOPHAGOGASTRODUODENOSCOPY (EGD);  Surgeon: Rogene Houston, MD;  [facility];  Service: Endoscopy;  Laterality: N/A;  325-changed to 225 Ann to notify pt  . HERNIA REPAIR    . low back surgery    . UPPER GASTROINTESTINAL ENDOSCOPY  07/13/06  . UPPER GASTROINTESTINAL ENDOSCOPY  02/19/05  . UPPER GASTROINTESTINAL ENDOSCOPY  07/22/2010   EGD ED    Prior to Admission medications   Medication Sig Start Date End Date Taking? Authorizing Provider  benzonatate (TESSALON)  100 MG capsule Take 100 mg by mouth 2 (two) times daily as needed for cough.    Yes [provider]  diclofenac sodium (VOLTAREN) 1 % GEL Apply topically 3 (three) times daily as needed (for pain).   Yes [provider]  fexofenadine (ALLEGRA) 180 MG tablet Take 180 mg by mouth daily. 09/04/15  Yes [provider]  Fluticasone-Salmeterol (ADVAIR) 250-50 MCG/DOSE AEPB Inhale 1 puff into the lungs 2 (two) times daily.    Yes [provider]  levothyroxine (SYNTHROID, LEVOTHROID) 150 MCG tablet Take 150 mcg by mouth daily before breakfast.  02/09/18  Yes [provider]  meloxicam (MOBIC) 7.5 MG tablet Take 7.5 mg by mouth daily as needed.  06/15/18  Yes [provider]  Multiple Vitamin (MULTIVITAMIN WITH MINERALS) TABS tablet Take 1 tablet by mouth daily.   Yes [provider]  olopatadine (PATANOL) 0.1 % ophthalmic solution Place 1 drop into both eyes 2 (two) times daily.  10/13/15  Yes [provider]  pantoprazole (PROTONIX) 40 MG tablet TAKE 1 TABLET BY MOUTH EVERY DAY Patient taking differently: Take 40 mg by mouth daily.  09/04/15  Yes Sriman Tally, Mechele Dawley, MD    Current Facility-Administered Medications  Medication Dose Route Frequency Provider Last Rate Last Dose  . 0.9 %  sodium chloride infusion   Intravenous Continuous Tat, David, MD   Stopped at 07/24/18 0129  . acetaminophen (TYLENOL) tablet 650 mg  650 mg Oral Q6H PRN Tat, David, MD       Or  . acetaminophen (TYLENOL) suppository 650 mg  650 mg Rectal Q6H PRN Tat, Shanon Brow, MD      . levothyroxine (SYNTHROID, LEVOTHROID) tablet 125 mcg  125 mcg Oral QAC breakfast Tat, Shanon Brow, MD   125 mcg at 07/24/18 0543  . MEDLINE mouth rinse  15 mL Mouth Rinse BID Tat, David, MD   15 mL at 07/24/18 1050  . mometasone-formoterol (DULERA) 200-5 MCG/ACT inhaler 2 puff  2 puff Inhalation BID Orson Eva, MD   2 puff at 07/24/18 0855  . olopatadine (PATANOL) 0.1 % ophthalmic solution 1 drop  1  drop Both Eyes BID Tat, David, MD   1 drop at 07/24/18 1048  . ondansetron (ZOFRAN) tablet 4 mg  4 mg Oral Q6H PRN Tat, David, MD       Or  . ondansetron (ZOFRAN) injection 4 mg  4 mg Intravenous Q6H PRN Tat, Shanon Brow, MD   4 mg at 07/24/18 1059  . ondansetron (ZOFRAN) injection 4 mg  4 mg Intravenous Q6H Tat, David, MD      . pantoprazole (PROTONIX) injection 40 mg  40 mg Intravenous Therisa Doyne, MD   40 mg at 07/24/18 1048    Allergies as of 07/23/2018 - Review Complete 07/23/2018  Allergen Reaction Noted  . Aspirin Swelling 05/21/2009  .  Esomeprazole magnesium Diarrhea   . Niacin Swelling   . Penicillins Swelling     Family History  Problem Relation Age of Onset  . Heart disease Father   . Healthy Sister   . Heart disease Sister   . Stroke Sister   . Stroke Daughter   . Diabetes Daughter   . Healthy Son     Social History   Socioeconomic History  . Marital status: Married    Spouse name: Not on file  . Number of children: 5  . Years of education: Not on file  . Highest education level: Not on file  Occupational History  . Occupation: retired    Comment: Optometrist tobacco  Social Needs  . Financial resource strain: Not hard at all  . Food insecurity:    Worry: Never true    Inability: Never true  . Transportation needs:    Medical: No    Non-medical: No  Tobacco Use  . Smoking status: Former Smoker    Last attempt to quit: 08/30/1957    Years since quitting: 60.9  . Smokeless tobacco: Never Used  . Tobacco comment: Quit smoking in 1959  Substance and Sexual Activity  . Alcohol use: No    Alcohol/week: 0.0 standard drinks  . Drug use: No  . Sexual activity: Not on file  Lifestyle  . Physical activity:    Days per week: 0 days    Minutes per session: 0 min  . Stress: Not at all  Relationships  . Social connections:    Talks on phone: Three times a week    Gets together: Three times a week    Attends religious service: More than 4 times per year     Active member of club or organization: Yes    Attends meetings of clubs or organizations: More than 4 times per year    Relationship status: Married  . Intimate partner violence:    Fear of current or ex partner: No    Emotionally abused: No    Physically abused: No    Forced sexual activity: No  Other Topics Concern  . Not on file  Social History Narrative  . Not on file    Review of Systems: See HPI, otherwise normal ROS  Physical Exam: Temp:  [97.1 F (36.2 C)-99.1 F (37.3 C)] 98.8 F (37.1 C) (11/25 0820) Pulse Rate:  [58-120] 68 (11/25 0820) Resp:  [12-18] 14 (11/25 0820) BP: (87-146)/(45-91) 146/78 (11/25 0820) SpO2:  [97 %-100 %] 99 % (11/25 0859) Weight:  [59.6 kg-60 kg] 59.6 kg (11/25 0352) Last BM Date: 07/23/18  Patient is alert and appears to be in no acute distress. He has generalized wasting. He appears pale. Conjunctiva is also pale and sclera nonicteric. Oropharyngeal mucosa is unremarkable. No neck masses or thyromegaly noted. Cardiac exam with regular rhythm normal S1 and S2.  He has a grade 2/6 systolic ejection murmur best heard at left sternal border. Auscultation lungs reveal vesicular breath sounds bilaterally. Abdomen is full.  He has long upper midline scar.  Bowel sounds are normal.  On palpation abdomen is soft and nontender.  No organomegaly or masses. Extremities are thin but no edema noted.   Lab Results: Recent Labs    07/23/18 0956 07/24/18 0005 07/24/18 1051  WBC 11.4*  --  15.4*  HGB 5.8* 5.8* 9.6*  HCT 20.6* 19.8* 32.1*  PLT 217  --  124*   BMET Recent Labs    07/23/18 0956 07/24/18 1051  NA 133* 137  K 4.7 4.2  CL 108 110  CO2 19* 20*  GLUCOSE 159* 135*  BUN 53* 71*  CREATININE 1.03 1.16  CALCIUM 7.0* 7.4*   PT/INR Recent Labs    07/23/18 0956  LABPROT 15.6*  INR 1.26   Serum iron 15, TIBC 361 and saturation 4% Serum ferritin 8.  Studies/Results: Dg Chest Portable 1 View  Result Date:  07/23/2018 CLINICAL DATA:  Shortness of breath and dizziness beginning 1 hour ago, history of stomach cancer, COPD, coronary artery disease, hiatal hernia EXAM: PORTABLE CHEST 1 VIEW COMPARISON:  Portable exam 1008 hours compared to 03/31/2018 FINDINGS: Upper normal heart size. Tortuous and question aneurysmal descending thoracic aorta with mild atherosclerotic calcification. Chronic peribronchial thickening with mild chronic accentuation of pulmonary markings. Lungs otherwise clear. No infiltrate, pleural effusion or pneumothorax. Bones demineralized. 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 Bronchitic changes with chronic accentuation of pulmonary markings. No acute abnormalities. Electronically Signed   By: Lavonia Dana M.D.   On: 07/23/2018 10:34    Assessment;  Patient is 84 year old Caucasian male with multiple medical problems including history of peptic ulcer disease history of early stage gastric adenocarcinoma treated endoscopically as well as history of GI motility disorder who presents with upper GI bleed.  He must of been losing blood chronically given iron deficiency anemia.  He could also have iron malabsorption.  He has been on meloxicam recently.  He therefore could have peptic ulcer disease.  He could also have recurrence of gastric adenocarcinoma.  He could also have a aortoenteric fistula given history of abdominal aortic aneurysm repair. Patient has decided no intervention at this time other than supportive therapy and comfort measures. Given his poor health his prognosis unfortunately is not a good evening he survives this episode. I would agree to treat empirically with peptic ulcer disease with proton pump inhibitor and hope for the past. Since condition discussed with his son Patrick [place] at length.  I would be glad to reevaluate patient again if needed.  Recommendations;  Continue  pantoprazole at current dose which is 40 mg IV every 12 hours. Continue supportive therapy and comfort measures. NG tube may be considered if he continues to have emesis or hematemesis.    LOS: 0 days   Luis Guerra  07/24/2018, 1:13 PM

## 2018-07-24 NOTE — Progress Notes (Signed)
Jeannette Corpus, NP notified of black/ruby stools of large amounts and coffee ground emesis.  Blood pressure 92/52.

## 2018-07-24 NOTE — Clinical Social Work Note (Signed)
Pt is a 84 year old male admitted from [facility]. Pt was discussed in Progression this AM. Per MD, GI is consulted due to bleeding. MD states family is not wanting aggressive intervention in keeping with pt's wishes. Will await recommendations of MD and pt/family decision. Updated Tammy at [facility]. Will follow and assist as needed.

## 2018-07-24 NOTE — Progress Notes (Signed)
Jeannette Corpus, NP notified of critical Hgb 5.8.

## 2018-07-24 NOTE — Progress Notes (Signed)
PT Cancellation Note  Patient Details Name: Luis Guerra MRN: [id number] DOB: November 27, 1920   Cancelled Treatment:    Reason Eval/Treat Not Completed: Medical issues which prohibited therapy.  Patient held per RN request today secondary patient having episodes of vomiting and not fully medical stable for therapy at this time.  Will check back tomorrow.   11:56 AM, 07/24/18 Lonell Grandchild, MPT Physical Therapist with [facility] [phone] office [phone] mobile phone

## 2018-07-24 NOTE — Progress Notes (Signed)
PROGRESS NOTE  Luis Guerra JEH:[id number] DOB: Aug 12, 1921 DOA: 07/23/2018 PCP: Pablo Lawrence, NP  Brief History:   84 y.o. male with medical history of abdominal aortic aneurysm, laryngeal cancer, coronary disease, gastric lymph-epithelioma, hypertension, hypothyroidism presenting with generalized weakness and shortness of breath.  The patient had been in his usual state of health until the morning of 07/23/2018.  The patient got up to go to the bathroom and washed up.  He stated that he had much difficulty getting up out of bed and felt dizzy and has some shortness of breath.  He subsequently contacted the administrator at [facility], the patient was subsequently brought to the hospital for further evaluation.  The patient denied any chest pain or syncope.  He stated he had one episode of nausea and vomiting with coffee-ground emesis.  In addition, the patient has had some intermittent melena for this past week. Upon presentation, the patient was afebrile with soft blood pressures in the upper 80s.  Hemoglobin was noted to be 5.8.  WBC was 11.4.  BMP showed sodium 133 but was otherwise unremarkable.  EKG shows sinus rhythm without ST ST wave changes.  Hemoccult was positive without any bright red blood.  The patient was initially transfused 2 units PRBC.  The patient continued to have coffee-ground emesis and melena.  Repeat hemoglobin was 5.8.  He was transfused an additional 2 units.  Assessment/Plan: Acute blood loss anemia/melena/symptomatic anemia -transfused 4 units PRBCs total -d/c meloxicam -gentle IVF -consult GI -INR 1.26 -IV Protonix twice daily -make npo as pt continued to have vomiting -check iron studies--iron sat 4%, ferritin 8 -son expressed pt did not want any invasive procedure -H/H q 6 hrs  Essential hypertension -Holding losartan secondary to soft blood pressure  Hypothyroidism -Continue Synthroid  Aortic dissection - admitted to [facility] 09/2016 with  abdominal pain and HTN with SBPs in 200s. 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  CAD  - last cath 2008 with occluded D2, has been medically managed  - he is not on ASA because of allergy. C -also previously on plavix; not currently on any antiplatelet agents -no chest pain  Laryngeal cancer -Diagnosed approximately 2 years prior to this admission -Received radiation -Currently in remission  COPD -Presently stable on room air -Continue LABA  Goals of Care -confirmed DNR -son does not want invasive procedures -considering full comfort measures if no improvement medically Advance care planning, including the explanation and discussion of advance directives was carried out with the patient and family.  Code status including explanations of "Full Code" and "DNR" and alternatives were discussed in detail.  Discussion of end-of-life issues including but not limited palliative care, hospice care and the concept of hospice, other end-of-life care options, power of attorney for health care decisions, living wills, and physician orders for life-sustaining treatment were also discussed with the patient and family.  Total face to face time 16 minutes.    Disposition Plan:   Unstable for d/c Family Communication:   Son updated at bedside 11/25 Total time spent 35 minutes.  Greater than 50% spent face to face counseling and coordinating care.  Consultants:  GI  Code Status:   DNR  DVT Prophylaxis:  SCDs   Procedures: As Listed in Progress Note Above  Antibiotics: None      Subjective: Patient  denies fevers, chills, headache, chest pain, dyspnea, nausea, vomiting, diarrhea, abdominal pain, dysuria, hematuria, hematochezia, and melena.   Objective: Vitals:   07/24/18 0600 07/24/18 0630 07/24/18 0820 07/24/18 0859  BP:  126/65 (!) 146/78   Pulse:  (!) 120 (!) 58 68   Resp: 18 12 14    Temp:   98.8 F (37.1 C)   TempSrc:   Oral   SpO2: 100% 100%  99%  Weight:      Height:        Intake/Output Summary (Last 24 hours) at 07/24/2018 1021 Last data filed at 07/24/2018 0820 Gross per 24 hour  Intake 2043 ml  Output 610 ml  Net 1433 ml   Weight change:  Exam:   General:  Pt is alert, follows commands appropriately, not in acute distress  HEENT: No icterus, No thrush, No neck mass, [place]/AT  Cardiovascular: RRR, S1/S2, no rubs, no gallops  Respiratory: bibasilar crackles, no wheeze  Abdomen: Soft/+BS, non tender, non distended, no guarding  Extremities: No edema, No lymphangitis, No petechiae, No rashes, no synovitis   Data Reviewed: I have personally reviewed following labs and imaging studies Basic Metabolic Panel: Recent Labs  Lab 07/23/18 0956  NA 133*  K 4.7  CL 108  CO2 19*  GLUCOSE 159*  BUN 53*  CREATININE 1.03  CALCIUM 7.0*   Liver Function Tests: No results for input(s): AST, ALT, ALKPHOS, BILITOT, PROT, ALBUMIN in the last 168 hours. No results for input(s): LIPASE, AMYLASE in the last 168 hours. No results for input(s): AMMONIA in the last 168 hours. Coagulation Profile: Recent Labs  Lab 07/23/18 0956  INR 1.26   CBC: Recent Labs  Lab 07/23/18 0956 07/24/18 0005  WBC 11.4*  --   NEUTROABS 10.2*  --   HGB 5.8* 5.8*  HCT 20.6* 19.8*  MCV 78.6*  --   PLT 217  --    Cardiac Enzymes: Recent Labs  Lab 07/23/18 0956  TROPONINI <0.03   BNP: Invalid input(s): POCBNP CBG: No results for input(s): GLUCAP in the last 168 hours. HbA1C: No results for input(s): HGBA1C in the last 72 hours. Urine analysis:    Component Value Date/Time   COLORURINE STRAW (A) 07/23/2018 0951   APPEARANCEUR CLEAR 07/23/2018 0951   LABSPEC 1.016 07/23/2018 0951   PHURINE 5.0 07/23/2018 0951   GLUCOSEU NEGATIVE 07/23/2018 0951   HGBUR NEGATIVE 07/23/2018 0951   BILIRUBINUR NEGATIVE 07/23/2018 0951    KETONESUR 5 (A) 07/23/2018 0951   PROTEINUR NEGATIVE 07/23/2018 0951   UROBILINOGEN 0.2 09/09/2014 1914   NITRITE NEGATIVE 07/23/2018 0951   LEUKOCYTESUR NEGATIVE 07/23/2018 0951   Sepsis Labs: @LABRCNTIP (procalcitonin:4,lacticidven:4) ) Recent Results (from the past 240 hour(s))  MRSA PCR Screening     Status: None   Collection Time: 07/23/18  1:25 PM  Result Value Ref Range Status   MRSA by PCR NEGATIVE NEGATIVE Final    Comment:        The GeneXpert MRSA Assay (FDA approved for NASAL specimens only), is one component of a comprehensive MRSA colonization surveillance program. It is not intended to diagnose MRSA infection nor to guide or monitor treatment for MRSA infections. Performed at [facility], [address] [place], [place] [address]      Scheduled Meds: . levothyroxine  125 mcg Oral QAC breakfast  . loratadine  10 mg Oral Daily  . mouth rinse  15 mL Mouth Rinse BID  . mometasone-formoterol  2 puff Inhalation BID  . olopatadine  1 drop  Both Eyes BID  . pantoprazole (PROTONIX) IV  40 mg Intravenous Q12H   Continuous Infusions: . sodium chloride Stopped (07/24/18 0129)    Procedures/Studies: Dg Chest Portable 1 View  Result Date: 07/23/2018 CLINICAL DATA:  Shortness of breath and dizziness beginning 1 hour ago, history of stomach cancer, COPD, coronary artery disease, hiatal hernia EXAM: PORTABLE CHEST 1 VIEW COMPARISON:  Portable exam 1008 hours compared to 03/31/2018 FINDINGS: Upper normal heart size. Tortuous and question aneurysmal descending thoracic aorta with mild atherosclerotic calcification. Chronic peribronchial thickening with mild chronic accentuation of pulmonary markings. Lungs otherwise clear. No infiltrate, pleural effusion or pneumothorax. Bones demineralized. 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 Bronchitic changes with chronic accentuation of  pulmonary markings. No acute abnormalities. Electronically Signed   By: Lavonia Dana M.D.   On: 07/23/2018 10:34    Orson Eva, DO  [facility] Pager [phone]  If 7PM-7AM, please contact night-coverage [email or web address] Password [email or web address] 07/24/2018, 10:21 AM   LOS: 0 days

## 2018-07-24 NOTE — Progress Notes (Signed)
Pt continued to show signs of continued blood loss with persistent coffee ground emesis throughout the afternoon. Son at bedside--updated and discussed Shubert again Son expressed that he did not want any more blood draws.  He did not want any more tranfusions at this point.  He is okay with continuing IVF. -continue protonix for now as palliative measure -add phenergan and reglan for recurrent vomiting -family ok with NG if continues to vomit -consult CSW for transition to residential hospice as pt cannot return to ALF  DTat

## 2018-07-25 LAB — TYPE AND SCREEN
ABO/RH(D): A POS
ANTIBODY SCREEN: NEGATIVE
UNIT DIVISION: 0
Unit division: 0
Unit division: 0
Unit division: 0

## 2018-07-25 LAB — BPAM RBC
BLOOD PRODUCT EXPIRATION DATE: 201912182359
BLOOD PRODUCT EXPIRATION DATE: 201912232359
BLOOD PRODUCT EXPIRATION DATE: 201912232359
Blood Product Expiration Date: 201912232359
ISSUE DATE / TIME: 201911241218
ISSUE DATE / TIME: 201911241614
ISSUE DATE / TIME: 201911250525
ISSUE DATE / TIME: 201911250137
UNIT TYPE AND RH: 6200
Unit Type and Rh: 6200
Unit Type and Rh: 6200
Unit Type and Rh: 6200

## 2018-07-25 MED ORDER — ONDANSETRON 8 MG PO TBDP: 8.0000 mg | ORAL_TABLET | Freq: Three times a day (TID) | ORAL | 0 refills | Status: AC | PRN

## 2018-07-25 MED ORDER — MORPHINE SULFATE (CONCENTRATE) 10 MG /0.5 ML PO SOLN: 4.0000 mg | ORAL | 0 refills | Status: DC | PRN

## 2018-07-25 NOTE — Clinical Social Work Note (Signed)
Jalene, Lacko #[id number] (CSN: [id number]) [phone] y.o. M) (Adm: 07/23/18)  [id number]  PCP   KEATTS, COURTNEY  Demographics  Comment    Last edited by  on at   Address: Home Phone: Work Phone: Mobile Phone:  [employer]  [address] [place] [address]   [phone] -- --  SSN: Insurance: Marital Status: Religion:  [id number] [place] Married [place]  Patient Ethnicity & Race   Ethnic Group Patient Race  Not Hispanic or Latino White or Caucasian  Documents Filed to Patient   Power of Attorney Living Will Clinical Unknown Study Attachment Consent Form ABN Waiver After Visit Summary Lab Result Scan Code Status MyChart Status Advance Care Planning  Not on File Not on File Not on File Not on File Filed Not on File Jani Files DNR [Updated on 07/23/18 1326] Pending Jump to the Activity  Admission Information   Attending Provider Admitting Provider Admission Type Admission Date/Time  Tat, Shanon Brow, MD Orson Eva, MD Emergency 07/23/18 [phone]  Discharge Date Hospital Service Auth/Cert Status Service Area   Acute Care Incomplete [facility]  Unit Room/Bed Admission Status   AP-DEPT 300 A308/A308-01 Admission (Confirmed)   Hospital Account   Name Acct ID Class Status Primary Coverage  Dannie, [place] [id number] Inpatient Open MEDICARE - MEDICARE PART A AND B      Guarantor Account (for Hospital Account [id number])   Name Relation to Pt Service Area Active? Acct Type  Sheran Luz [facility] Yes Personal/Family  Address Phone    [address] [place], [place] [address] [phone])        Coverage Information (for Hospital Account [id number])   1. [place] PART A AND B   F/O Payor/Plan Precert #  MEDICARE/MEDICARE PART A AND B   Subscriber Subscriber #  Elyas, Villamor [id number]  Address Phone  [address] [place], [place] [address]   2. GENERIC COMMERCIAL/GENERIC COMMERCIAL   F/O Payor/Plan Precert #  [facility] COMMERCIAL/GENERIC  COMMERCIAL   Subscriber Subscriber #  Shaunak, Kreis [id number]  [place] [address] [phone]

## 2018-07-25 NOTE — Clinical Social Work Note (Addendum)
LCSW following. Per MD, pt and family request full comfort care and residential hospice. Referral sent to hospice. Will await their review and follow up to assist with transition of care.  Received call from Arrington at [facility] stating that the RN would be out to evaluate pt this afternoon. Pt will need to be GIP as there are currently no beds at the residence. Will update MD and follow.

## 2018-07-25 NOTE — Discharge Summary (Signed)
Physician Discharge Summary  Luis Guerra HFW:[id number] DOB: 01-Dec-1920 DOA: 07/23/2018  PCP: Pablo Lawrence, NP  Admit date: 07/23/2018 Discharge date: 07/25/2018  Admitted From: [facility] Disposition:  Residential Hospice  Recommendations for Outpatient Follow-up:  1. Follow up with PCP in 1-2 weeks 2. Please obtain BMP/CBC in one week    Discharge Condition: Stable CODE STATUS: FULL COMFORT Diet recommendation: Comfort Feeding   Brief/Interim Summary: 84 y.o.malewith medical history ofabdominal aortic aneurysm, laryngeal cancer, coronary disease, gastric lymph-epithelioma, hypertension, hypothyroidism presenting with generalized weakness and shortness of breath. The patient had been in his usual state of health until the morning of 07/23/2018. The patient got up to go to the bathroom and washed up. He stated that he had much difficulty getting up out of bed and felt dizzy and has some shortness of breath. He subsequently contacted the administrator at [facility], the patient was subsequently brought to the hospital for further evaluation. The patient denied any chest pain or syncope. He stated he had one episode of nausea and vomiting with coffee-ground emesis. In addition, the patient has had some intermittent melena for this past week.Upon presentation, the patient was afebrile with soft blood pressures in the upper 80s. Hemoglobin was noted to be 5.8. WBC was 11.4. BMP showed sodium 133 but was otherwise unremarkable. EKG shows sinus rhythm without ST ST wave changes. Hemoccult was positive without any bright red blood.  The patient was initially transfused 2 units PRBC.  The patient continued to have coffee-ground emesis and melena.  Repeat hemoglobin was 5.8.  He was transfused an additional 2 units.  Repeat hemoglobin was 9.6 after 4 units of PRBC.  The patient continued to have coffee-ground emesis despite Protonix and Zofran.  GI was consulted.  However,  after discussion with the patient's family, they do not want any aggressive intervention or invasive procedures.  They wanted the patient to transition to a focus of care on full comfort.  Social work was consulted to assist with transition to residential hospice.  Discharge Diagnoses:  Acute blood loss anemia/melena/symptomatic anemia -transfused 4 units PRBCs total -d/c meloxicam -gentle IVF -consult GI appreciated--family did not want any invasive procedures or aggressive measures -INR 1.26 -IV Protonix twice daily-->discontinued as pt has been transitioned to full comfort measures -zofran around the clock -add pheneragan for break through n/v -add reglan for n/v -check iron studies--iron sat 4%, ferritin 8 -son expressed pt did not want any invasive procedure -d/c all blood draws and x rays per family request  Essential hypertension -Holding losartan secondary to soft blood pressure  Hypothyroidism -Continue Synthroid  Aortic dissection - admitted to [facility] 09/2016 with abdominal pain and HTN with SBPs in 200s. 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  CAD  - last cath 2008 with occluded D2, has been medically managed  - he is not on ASA because of allergy. C -also previously on plavix; not currently on any antiplatelet agents -no chest pain  Laryngeal cancer -Diagnosed approximately 2 years prior to this admission -Received radiation -Currently in remission  COPD -Presently stable on room air -ContinueLABA  Goals of Care -confirmed DNR -son does not want invasive procedures -after discussion with family-->transition to full comfort measures -CSW to assist with transition to residential hospice Advance care planning, including the explanation and discussion of advance directives was carried out with the patient and family.  Code  status including explanations of "Full Code" and "DNR" and alternatives were discussed in detail.  Discussion of end-of-life issues including but not limited palliative care, hospice care and the concept of hospice, other end-of-life care options, power of attorney for health care decisions, living wills, and physician orders for life-sustaining treatment were also discussed with the patient and family.     Discharge Instructions   Allergies as of 07/25/2018      Reactions   Aspirin Swelling   Esomeprazole Magnesium Diarrhea   Niacin Swelling   Penicillins Swelling   Has patient had a PCN reaction causing immediate rash, facial/tongue/throat swelling, SOB or lightheadedness with hypotension: YES Has patient had a PCN reaction causing severe rash involving mucus membranes or skin necrosis: NO Has patient had a PCN reaction that required hospitalization: NO Has patient had a PCN reaction occurring within the last 10 years: NO If all of the above answers are "NO", then may proceed with Cephalosporin use.      Medication List    STOP taking these medications   benzonatate 100 MG capsule Commonly known as:  TESSALON   diclofenac sodium 1 % Gel Commonly known as:  VOLTAREN   fexofenadine 180 MG tablet Commonly known as:  ALLEGRA   Fluticasone-Salmeterol 250-50 MCG/DOSE Aepb Commonly known as:  ADVAIR   levothyroxine 150 MCG tablet Commonly known as:  SYNTHROID, LEVOTHROID   meloxicam 7.5 MG tablet Commonly known as:  MOBIC   multivitamin with minerals Tabs tablet   olopatadine 0.1 % ophthalmic solution Commonly known as:  PATANOL   pantoprazole 40 MG tablet Commonly known as:  PROTONIX     TAKE these medications   morphine CONCENTRATE 10 mg / 0.5 ml concentrated solution Take 0.2 mLs (4 mg total) by mouth every 2 (two) hours as needed for moderate pain, severe pain or shortness of breath.   ondansetron 8 MG disintegrating tablet Commonly known as:  ZOFRAN-ODT Take 1  tablet (8 mg total) by mouth every 8 (eight) hours as needed for nausea or vomiting.       Allergies  Allergen Reactions  . Aspirin Swelling  . Esomeprazole Magnesium Diarrhea  . Niacin Swelling  . Penicillins Swelling    Has patient had a PCN reaction causing immediate rash, facial/tongue/throat swelling, SOB or lightheadedness with hypotension: YES Has patient had a PCN reaction causing severe rash involving mucus membranes or skin necrosis: NO Has patient had a PCN reaction that required hospitalization: NO Has patient had a PCN reaction occurring within the last 10 years: NO If all of the above answers are "NO", then may proceed with Cephalosporin use.     Consultations:  GI--Rehman   Procedures/Studies: Dg Chest Portable 1 View  Result Date: 07/23/2018 CLINICAL DATA:  Shortness of breath and dizziness beginning 1 hour ago, history of stomach cancer, COPD, coronary artery disease, hiatal hernia EXAM: PORTABLE CHEST 1 VIEW COMPARISON:  Portable exam 1008 hours compared to 03/31/2018 FINDINGS: Upper normal heart size. Tortuous and question aneurysmal descending thoracic aorta with mild atherosclerotic calcification. Chronic peribronchial thickening with mild chronic accentuation of pulmonary markings. Lungs otherwise clear. No infiltrate, pleural effusion or pneumothorax. Bones demineralized. 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 Bronchitic changes with chronic accentuation of pulmonary markings. No acute abnormalities. Electronically Signed   By: Lavonia Dana M.D.   On: 07/23/2018 10:34        Discharge Exam: Vitals:   07/25/18  0500 07/25/18 0745  BP: (!) 122/58   Pulse: 61   Resp: 16   Temp: 98.2 F (36.8 C) (!) 97.5 F (36.4 C)  SpO2: 100%    Vitals:   07/24/18 1000 07/24/18 2020 07/25/18 0500 07/25/18 0745  BP: 139/61 112/70 (!) 122/58   Pulse: 63 72 61   Resp:  17 20 16    Temp:  98.6 F (37 C) 98.2 F (36.8 C) (!) 97.5 F (36.4 C)  TempSrc:  Oral Axillary Oral  SpO2: 100% 100% 100%   Weight:      Height:        General: Pt is alert, awake, not in acute distress Cardiovascular: RRR, S1/S2 +, no rubs, no gallops Respiratory: CTA bilaterally, no wheezing, no rhonchi Abdominal: Soft, NT, ND, bowel sounds + Extremities: no edema, no cyanosis   The results of significant diagnostics from this hospitalization (including imaging, microbiology, ancillary and laboratory) are listed below for reference.    Significant Diagnostic Studies: Dg Chest Portable 1 View  Result Date: 07/23/2018 CLINICAL DATA:  Shortness of breath and dizziness beginning 1 hour ago, history of stomach cancer, COPD, coronary artery disease, hiatal hernia EXAM: PORTABLE CHEST 1 VIEW COMPARISON:  Portable exam 1008 hours compared to 03/31/2018 FINDINGS: Upper normal heart size. Tortuous and question aneurysmal descending thoracic aorta with mild atherosclerotic calcification. Chronic peribronchial thickening with mild chronic accentuation of pulmonary markings. Lungs otherwise clear. No infiltrate, pleural effusion or pneumothorax. Bones demineralized. 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 Bronchitic changes with chronic accentuation of pulmonary markings. No acute abnormalities. Electronically Signed   By: Lavonia Dana M.D.   On: 07/23/2018 10:34     Microbiology: Recent Results (from the past 240 hour(s))  MRSA PCR Screening     Status: None   Collection Time: 07/23/18  1:25 PM  Result Value Ref Range Status   MRSA by PCR NEGATIVE NEGATIVE Final    Comment:        The GeneXpert MRSA Assay (FDA approved for NASAL specimens only), is one component of a comprehensive MRSA colonization surveillance program. It is not intended to diagnose MRSA infection nor to guide  or monitor treatment for MRSA infections. Performed at [facility], [address], [place], [place] [address]      Labs: Basic Metabolic Panel: Recent Labs  Lab 07/23/18 0956 07/24/18 1051  NA 133* 137  K 4.7 4.2  CL 108 110  CO2 19* 20*  GLUCOSE 159* 135*  BUN 53* 71*  CREATININE 1.03 1.16  CALCIUM 7.0* 7.4*   Liver Function Tests: No results for input(s): AST, ALT, ALKPHOS, BILITOT, PROT, ALBUMIN in the last 168 hours. No results for input(s): LIPASE, AMYLASE in the last 168 hours. No results for input(s): AMMONIA in the last 168 hours. CBC: Recent Labs  Lab 07/23/18 0956 07/24/18 0005 07/24/18 1051 07/24/18 1353  WBC 11.4*  --  15.4*  --   NEUTROABS 10.2*  --   --   --   HGB 5.8* 5.8* 9.6* 10.8*  HCT 20.6* 19.8* 32.1* 36.8*  MCV 78.6*  --  85.1  --   PLT 217  --  124*  --    Cardiac Enzymes: Recent Labs  Lab 07/23/18 0956  TROPONINI <0.03   BNP: Invalid input(s): POCBNP CBG: No results for input(s): GLUCAP in the last 168 hours.  Time coordinating discharge:  36 minutes  Signed:  Orson Eva, DO [facility] Pager: [phone] 07/25/2018, 8:37 AM

## 2018-07-26 ENCOUNTER — Inpatient Hospital Stay (HOSPITAL_COMMUNITY)
Admit: 2018-07-26 | Discharge: 2018-07-28 | DRG: 377 | Disposition: A | Discharge status: Hospice | Payer: Medicare Other | Source: Ambulatory Visit | Attending: Internal Medicine | Admitting: Internal Medicine

## 2018-07-26 DIAGNOSIS — I712 Thoracic aortic aneurysm, without rupture: Secondary | ICD-10-CM | POA: Diagnosis not present

## 2018-07-26 DIAGNOSIS — Z923 Personal history of irradiation: Secondary | ICD-10-CM | POA: Diagnosis not present

## 2018-07-26 DIAGNOSIS — E039 Hypothyroidism, unspecified: Secondary | ICD-10-CM

## 2018-07-26 DIAGNOSIS — I10 Essential (primary) hypertension: Secondary | ICD-10-CM

## 2018-07-26 DIAGNOSIS — Z886 Allergy status to analgesic agent status: Secondary | ICD-10-CM | POA: Diagnosis not present

## 2018-07-26 DIAGNOSIS — K921 Melena: Secondary | ICD-10-CM | POA: Diagnosis present

## 2018-07-26 DIAGNOSIS — Z880 Allergy status to penicillin: Secondary | ICD-10-CM

## 2018-07-26 DIAGNOSIS — Z515 Encounter for palliative care: Secondary | ICD-10-CM

## 2018-07-26 DIAGNOSIS — D62 Acute posthemorrhagic anemia: Secondary | ICD-10-CM | POA: Diagnosis present

## 2018-07-26 DIAGNOSIS — Z888 Allergy status to other drugs, medicaments and biological substances status: Secondary | ICD-10-CM | POA: Diagnosis not present

## 2018-07-26 DIAGNOSIS — Z66 Do not resuscitate: Secondary | ICD-10-CM | POA: Diagnosis present

## 2018-07-26 DIAGNOSIS — J449 Chronic obstructive pulmonary disease, unspecified: Secondary | ICD-10-CM | POA: Diagnosis present

## 2018-07-26 DIAGNOSIS — Z8521 Personal history of malignant neoplasm of larynx: Secondary | ICD-10-CM

## 2018-07-26 DIAGNOSIS — I2510 Atherosclerotic heart disease of native coronary artery without angina pectoris: Secondary | ICD-10-CM | POA: Diagnosis present

## 2018-07-26 DIAGNOSIS — I7100 Dissection of unspecified site of aorta: Secondary | ICD-10-CM | POA: Diagnosis present

## 2018-07-26 DIAGNOSIS — K922 Gastrointestinal hemorrhage, unspecified: Secondary | ICD-10-CM | POA: Diagnosis not present

## 2018-07-26 DIAGNOSIS — I6521 Occlusion and stenosis of right carotid artery: Secondary | ICD-10-CM | POA: Diagnosis not present

## 2018-07-26 DIAGNOSIS — C169 Malignant neoplasm of stomach, unspecified: Secondary | ICD-10-CM | POA: Diagnosis not present

## 2018-07-26 MED ORDER — ONDANSETRON HCL 4 MG/2ML IJ SOLN: 4.0000 mg | Freq: Four times a day (QID) | INTRAMUSCULAR | Status: DC | PRN

## 2018-07-26 MED ORDER — ACETAMINOPHEN 325 MG PO TABS: 650.0000 mg | ORAL_TABLET | Freq: Four times a day (QID) | ORAL | Status: DC | PRN

## 2018-07-26 MED ORDER — SODIUM CHLORIDE 0.9 % IV SOLN: 250.0000 mL | INTRAVENOUS | Status: DC | PRN

## 2018-07-26 MED ORDER — ORAL CARE MOUTH RINSE
15.0000 mL | Freq: Two times a day (BID) | OROMUCOSAL | Status: DC
  Administered 2018-07-26 – 2018-07-28 (×4): 15 mL via OROMUCOSAL

## 2018-07-26 MED ORDER — METOCLOPRAMIDE HCL 5 MG/ML IJ SOLN
10.0000 mg | Freq: Four times a day (QID) | INTRAMUSCULAR | Status: DC
  Administered 2018-07-27 – 2018-07-28 (×4): 10 mg via INTRAVENOUS
  Filled 2018-07-26 (×6): qty 2

## 2018-07-26 MED ORDER — OLOPATADINE HCL 0.1 % OP SOLN
1.0000 [drp] | Freq: Two times a day (BID) | OPHTHALMIC | Status: DC
  Administered 2018-07-26 – 2018-07-28 (×4): 1 [drp] via OPHTHALMIC
  Filled 2018-07-26: qty 5

## 2018-07-26 MED ORDER — ACETAMINOPHEN 650 MG RE SUPP: 650.0000 mg | Freq: Four times a day (QID) | RECTAL | Status: DC | PRN

## 2018-07-26 MED ORDER — GLYCOPYRROLATE 1 MG PO TABS: 1.0000 mg | ORAL_TABLET | ORAL | Status: DC | PRN

## 2018-07-26 MED ORDER — HALOPERIDOL 0.5 MG PO TABS: 0.5000 mg | ORAL_TABLET | ORAL | Status: DC | PRN

## 2018-07-26 MED ORDER — ONDANSETRON HCL 4 MG PO TABS: 4.0000 mg | ORAL_TABLET | Freq: Four times a day (QID) | ORAL | Status: DC | PRN

## 2018-07-26 MED ORDER — LORAZEPAM 1 MG PO TABS: 1.0000 mg | ORAL_TABLET | ORAL | Status: DC | PRN

## 2018-07-26 MED ORDER — SODIUM CHLORIDE 0.9% FLUSH
3.0000 mL | Freq: Two times a day (BID) | INTRAVENOUS | Status: DC
  Administered 2018-07-26 – 2018-07-27 (×3): 3 mL via INTRAVENOUS
  Administered 2018-07-28: 10 mL via INTRAVENOUS

## 2018-07-26 MED ORDER — LORAZEPAM 2 MG/ML IJ SOLN: 1.0000 mg | INTRAMUSCULAR | Status: DC | PRN

## 2018-07-26 MED ORDER — MORPHINE SULFATE (PF) 2 MG/ML IV SOLN: 2.0000 mg | INTRAVENOUS | Status: DC | PRN

## 2018-07-26 MED ORDER — PANTOPRAZOLE SODIUM 40 MG PO TBEC
40.0000 mg | DELAYED_RELEASE_TABLET | Freq: Two times a day (BID) | ORAL | Status: DC
  Administered 2018-07-26 – 2018-07-27 (×3): 40 mg via ORAL
  Filled 2018-07-26 (×4): qty 1

## 2018-07-26 MED ORDER — ONDANSETRON HCL 4 MG/2ML IJ SOLN
4.0000 mg | Freq: Four times a day (QID) | INTRAMUSCULAR | Status: DC
  Administered 2018-07-27 – 2018-07-28 (×4): 4 mg via INTRAVENOUS
  Filled 2018-07-26 (×6): qty 2

## 2018-07-26 MED ORDER — HALOPERIDOL LACTATE 5 MG/ML IJ SOLN: 0.5000 mg | INTRAMUSCULAR | Status: DC | PRN

## 2018-07-26 MED ORDER — SODIUM CHLORIDE 0.9% FLUSH: 3.0000 mL | INTRAVENOUS | Status: DC | PRN

## 2018-07-26 MED ORDER — GLYCOPYRROLATE 0.2 MG/ML IJ SOLN: 0.2000 mg | INTRAMUSCULAR | Status: DC | PRN

## 2018-07-26 MED ORDER — LORAZEPAM 2 MG/ML PO CONC: 1.0000 mg | ORAL | Status: DC | PRN

## 2018-07-26 MED ORDER — HALOPERIDOL LACTATE 2 MG/ML PO CONC
0.5000 mg | ORAL | Status: DC | PRN
  Filled 2018-07-26: qty 0.3

## 2018-07-26 NOTE — H&P (Signed)
History and Physical    CALDWELL KRONENBERGER VVO:[id number] DOB: [birth year] DOA: 07/26/2018  PCP: Pablo Lawrence, NP  Patient coming from: was in the hospital GIP admission   Chief Complaint: GI bleed  HPI: DANTA BAUMGARDNER is a 84 y.o.malewith medical history ofabdominal aortic aneurysm, laryngeal cancer, coronary disease, gastric lymph-epithelioma, hypertension, hypothyroidism presenting with generalized weakness and shortness of breath. The patient had been in his usual state of health until the morning of 07/23/2018. The patient got up to go to the bathroom and washed up. He stated that he had much difficulty getting up out of bed and felt dizzy and has some shortness of breath. He subsequently contacted the administrator at [facility], the patient was subsequently brought to the hospital for further evaluation. The patient denied any chest pain or syncope. He stated he had one episode of nausea and vomiting with coffee-ground emesis. In addition, the patient has had some intermittent melena for this past week.Upon presentation, the patient was afebrile with soft blood pressures in the upper 80s. Hemoglobin was noted to be 5.8. WBC was 11.4. BMP showed sodium 133 but was otherwise unremarkable. EKG shows sinus rhythm without ST ST wave changes. Hemoccult was positive without any bright red blood.The patient was initially transfused 2 units PRBC. The patient continued to have coffee-ground emesis and melena. Repeat hemoglobin was 5.8. He was transfused an additional 2 units.  Repeat hemoglobin was 9.6 after 4 units of PRBC.  The patient continued to have coffee-ground emesis despite Protonix and Zofran.  GI was consulted.  However, after discussion with the patient's family, they do not want any aggressive intervention or invasive procedures.  They wanted the patient to transition to a focus of care on full comfort.  Past Medical/Surgical History: Past Medical History:  Diagnosis  Date  . AAA (abdominal aortic aneurysm) ([place])   . Cancer ([place])    stomach cancer  . Carotid occlusion, right   . COPD (chronic obstructive pulmonary disease) ([place])   . Coronary artery disease    WITH HISTORY OF OCCLUSION OF A DIAGONAL BRANCH  . Dysphagia   . Gastritis   . Hiatal hernia   . Hypothyroidism   . SBO (small bowel obstruction) ([place])   . Weight loss     Past Surgical History:  Procedure Laterality Date  . ABDOMINAL AORTIC ANEURYSM REPAIR    . BACK SURGERY    . BALLOON DILATION  05/26/2011   Procedure: BALLOON DILATION;  Surgeon: Rogene Houston, MD;  [facility];  Service: Endoscopy;  Laterality: N/A;  . CARDIAC CATHETERIZATION  06/08/2007   EF 55%  . CARDIOVASCULAR STRESS TEST  05/03/2006   EF 60%  . CHOLECYSTECTOMY    . COLONOSCOPY  07/22/2010  . ESOPHAGOGASTRODUODENOSCOPY  05/26/2011   Procedure: ESOPHAGOGASTRODUODENOSCOPY (EGD);  Surgeon: Rogene Houston, MD;  Location[facility];  Service: Endoscopy;  Laterality: N/A;  3:00  . ESOPHAGOGASTRODUODENOSCOPY  07/21/2012   Procedure: ESOPHAGOGASTRODUODENOSCOPY (EGD);  Surgeon: Rogene Houston, MD;  Location: [facility];  Service: Endoscopy;  Laterality: N/A;  325-changed to 225 Ann to notify pt  . HERNIA REPAIR    . low back surgery    . UPPER GASTROINTESTINAL ENDOSCOPY  07/13/06  . UPPER GASTROINTESTINAL ENDOSCOPY  02/19/05  . UPPER GASTROINTESTINAL ENDOSCOPY  07/22/2010   [facility] ED    Social History:  reports that he quit smoking about 60 years ago. He has never used smokeless tobacco. He reports that he does not  drink alcohol or use drugs.  Allergies: Allergies  Allergen Reactions  . Aspirin Swelling  . Esomeprazole Magnesium Diarrhea  . Niacin Swelling  . Penicillins Swelling    Has patient had a PCN reaction causing immediate rash, facial/tongue/throat swelling, SOB or lightheadedness with hypotension: YES Has patient had a PCN reaction causing severe rash involving mucus membranes or skin  necrosis: NO Has patient had a PCN reaction that required hospitalization: NO Has patient had a PCN reaction occurring within the last 10 years: NO If all of the above answers are "NO", then may proceed with Cephalosporin use.     Family History:  Family History  Problem Relation Age of Onset  . Heart disease Father   . Healthy Sister   . Heart disease Sister   . Stroke Sister   . Stroke Daughter   . Diabetes Daughter   . Healthy Son     Prior to Admission medications   Medication Sig Start Date End Date Taking? Authorizing Provider  Morphine Sulfate (MORPHINE CONCENTRATE) 10 mg / 0.5 ml concentrated solution Take 0.2 mLs (4 mg total) by mouth every 2 (two) hours as needed for moderate pain, severe pain or shortness of breath. 07/25/18   Orson Eva, MD  ondansetron (ZOFRAN ODT) 8 MG disintegrating tablet Take 1 tablet (8 mg total) by mouth every 8 (eight) hours as needed for nausea or vomiting. 07/25/18   Orson Eva, MD    Review of Systems:  Negative except as otherwise mentioned in HPI.    Physical Exam: There were no vitals filed for this visit.  Constitutional: NAD, calm, comfortable.  Patient reports no acute complaints currently.  No nausea vomiting. Eyes: PERRL, lids and conjunctivae normal, no icterus, no nystagmus. ENMT: Mucous membranes are moist. Posterior pharynx clear of any exudate or lesions.   Neck: normal, supple, no masses, no thyromegaly Respiratory: clear to auscultation bilaterally, no wheezing, no crackles. Normal respiratory effort. No accessory muscle use.  Cardiovascular: Regular rate and rhythm, no murmurs / rubs / gallops. No extremity edema. 2+ pedal pulses. No carotid bruits.  Abdomen: Mild distention, no tenderness, no masses palpated. No hepatosplenomegaly. Bowel sounds positive.  Musculoskeletal: no clubbing / cyanosis. No joint deformity upper and lower extremities. Good ROM, no contractures. Normal muscle tone.  Skin: no rashes, no petechiae,  no open wounds. Neurologic: CN 2-12 grossly intact. Sensation intact, DTR normal. Strength 4/5 in all 4 due to poor effort.  Psychiatric: Normal mood.    Labs on Admission: I have personally reviewed the following labs and imaging studies  CBC: Recent Labs  Lab 07/23/18 0956 07/24/18 0005 07/24/18 1051 07/24/18 1353  WBC 11.4*  --  15.4*  --   NEUTROABS 10.2*  --   --   --   HGB 5.8* 5.8* 9.6* 10.8*  HCT 20.6* 19.8* 32.1* 36.8*  MCV 78.6*  --  85.1  --   PLT 217  --  124*  --    Basic Metabolic Panel: Recent Labs  Lab 07/23/18 0956 07/24/18 1051  NA 133* 137  K 4.7 4.2  CL 108 110  CO2 19* 20*  GLUCOSE 159* 135*  BUN 53* 71*  CREATININE 1.03 1.16  CALCIUM 7.0* 7.4*   GFR: Estimated Creatinine Clearance: 32.1 mL/min (by C-G formula based on SCr of 1.16 mg/dL).  Coagulation Profile: Recent Labs  Lab 07/23/18 0956  INR 1.26   Cardiac Enzymes: Recent Labs  Lab 07/23/18 0956  TROPONINI <0.03   Urine analysis:  Component Value Date/Time   COLORURINE STRAW (A) 07/23/2018 [place] 07/23/2018 0951   LABSPEC 1.016 07/23/2018 0951   PHURINE 5.0 07/23/2018 0951   GLUCOSEU NEGATIVE 07/23/2018 0951   HGBUR NEGATIVE 07/23/2018 0951   BILIRUBINUR NEGATIVE 07/23/2018 0951   KETONESUR 5 (A) 07/23/2018 0951   PROTEINUR NEGATIVE 07/23/2018 0951   UROBILINOGEN 0.2 09/09/2014 1914   NITRITE NEGATIVE 07/23/2018 0951   LEUKOCYTESUR NEGATIVE 07/23/2018 0951    Recent Results (from the past 240 hour(s))  MRSA PCR Screening     Status: None   Collection Time: 07/23/18  1:25 PM  Result Value Ref Range Status   MRSA by PCR NEGATIVE NEGATIVE Final    Comment:        The GeneXpert MRSA Assay (FDA approved for NASAL specimens only), is one component of a comprehensive MRSA colonization surveillance program. It is not intended to diagnose MRSA infection nor to guide or monitor treatment for MRSA infections. Performed at [facility], [address] [place], [place] [address]      Radiological Exams on Admission: No results found.  EKG: none   Assessment/Plan Acute blood loss anemia/melena/symptomatic anemia -no further transfusions or intervention. -d/c meloxicam -family did not want any invasive procedures or aggressive measures -protonix PO if able to take it. -continue zofran and phenergan for control of nausea. -continue reglan for n/v -checked iron studies--iron sat 4%, ferritin 8 -son expressed pt did not want any invasive procedure -d/c all blood draws and x rays per family request  Essential hypertension -No further oral medication for blood pressure control will be given -Continue to focus on comfort care and symptomatic management only.  Hypothyroidism -Synthroid discontinued -Plan is for full comfort care and symptomatic management only.  Aortic dissection - admitted to [facility] 09/2016 with abdominal pain and HTN with SBPs in 200s. Found to have type B aortic dissection - managed conservatively based on discussions between physicians and family -Currently plan is to pursued full comfort care and symptomatic management only.  CAD  - last cath 2008 with occluded D2, has been medically managed  - he is not on ASA because of allergy.  -also previously on plavix; not currently on any antiplatelet agents due to ongoing GI bleed. -no chest pain, no shortness of breath.  Laryngeal cancer -Diagnosed approximately 2 years prior to this admission -Received radiation -Currently in remission -Plan is for comfort care and symptomatic management.  COPD -Presently stable on room air -ContinueLABA  Goals of Care -confirmed DNR -son does not want invasive procedures -after discussion with family-->transition to full comfort measures -CSW to assist with transition to residential hospice -Patient has been transitioned to [facility] and is waiting by the hospice home.   DVT prophylaxis: SCD's  Code Status:  DNR/DNI Family Communication: daughter at bedside  Disposition Plan: full comfort care and symptomatic management. Waiting for hospice bed. Consults called: none  Admission status: med-surg, LOS > 2 midnights, inpatient.    Time Spent: 65 minutes  Barton Dubois MD [facility] Pager [phone]  If 7PM-7AM, please contact night-coverage [email or web address] Password [facility]  07/26/2018, 7:14 PM

## 2018-07-27 DIAGNOSIS — D62 Acute posthemorrhagic anemia: Secondary | ICD-10-CM

## 2018-07-27 DIAGNOSIS — K922 Gastrointestinal hemorrhage, unspecified: Secondary | ICD-10-CM

## 2018-07-27 DIAGNOSIS — Z515 Encounter for palliative care: Secondary | ICD-10-CM

## 2018-07-27 NOTE — Progress Notes (Signed)
PROGRESS NOTE    Luis Guerra  ZCH:[id number] DOB: 1921-08-10 DOA: 07/26/2018 PCP: Pablo Lawrence, NP     Brief Narrative:  84 y.o.malewith medical history ofabdominal aortic aneurysm, laryngeal cancer, coronary disease, gastric lymph-epithelioma, hypertension, hypothyroidism presenting with generalized weakness and shortness of breath. The patient had been in his usual state of health until the morning of 07/23/2018. The patient got up to go to the bathroom and washed up. He stated that he had much difficulty getting up out of bed and felt dizzy and has some shortness of breath. He subsequently contacted the administrator at [facility], the patient was subsequently brought to the hospital for further evaluation. The patient denied any chest pain or syncope. He stated he had one episode of nausea and vomiting with coffee-ground emesis. In addition, the patient has had some intermittent melena for this past week.Upon presentation, the patient was afebrile with soft blood pressures in the upper 80s. Hemoglobin was noted to be 5.8. WBC was 11.4. BMP showed sodium 133 but was otherwise unremarkable. EKG shows sinus rhythm without ST ST wave changes. Hemoccult was positive without any bright red blood.The patient was initially transfused 2 units PRBC. The patient continued to have coffee-ground emesis and melena. Repeat hemoglobin was 5.8. He was transfused an additional 2 units.Repeat hemoglobin was 9.6 after 4 units of PRBC. The patient continued to have coffee-ground emesis despite Protonix and Zofran. GI was consulted. However, after discussion with the patient's family, they do not want any aggressive intervention or invasive procedures. They wanted the patient to transition to a focus of care on full comfort.  Assessment & Plan: Acute blood loss anemia/melena/symptomatic anemia -no further transfusions or interventions. -Continue symptomatic management and comfort  care. -continue protonix PO -no further blood draws. -continue zofran, phenergan and reglan for control of nausea.  Essential hypertension -Continue to focus on comfort care and symptomatic management only. -no further antihypertensive drugs to be given  Hypothyroidism -Synthroid discontinued -Plan is for full comfort care and symptomatic management only.  Aortic dissection -Currently plan is to pursued full comfort care and symptomatic management only. -VS stable -no pain  CAD  -last cath 2008 with occluded D2, has been medically managed  -he is not on ASA because of allergy.  -also he was previously on plavix; not currently on any antiplatelet agents due to ongoing GI bleed. -no chest pain, no shortness of breath.  Laryngeal cancer -Diagnosed approximately 2 years prior to this admission -Received radiation -Currently in remission -Plan is for comfort care and symptomatic management.  COPD -Presently stable on room air -no wheezing -continue PRN inahler/nebs  Goals of Care -DNR/DNI -Patient has been transitioned to [facility] and is waiting by the hospice home. -Continue full comfort and symptomatic management.  DVT prophylaxis: SCD's Code Status: DNR/DNI Family Communication: son at bedside  Disposition Plan: waiting for bed at residential hospice; continue symptomatic management.   Consultants:   GI  Palliative care.  Procedures:   None   Antimicrobials:  Anti-infectives (From admission, onward)   None      Subjective: Afebrile, denies any acute complaints, tolerating liquid diet and expressed to be comfortable. BP soft, but stable.  Objective: There were no vitals filed for this visit.  Intake/Output Summary (Last 24 hours) at 07/27/2018 1240 Last data filed at 07/27/2018 0900 Gross per 24 hour  Intake 540 ml  Output -  Net 540 ml   There were no vitals filed for this visit.  Examination: General exam: Alert,  awake, following  commands appropriately; patient is afebrile and Family reported stools to be more clear. Respiratory system: Clear to auscultation. Respiratory effort normal. Cardiovascular system:RRR. No murmurs, rubs, gallops. Gastrointestinal system: Abdomen is nondistended, soft and nontender. No organomegaly or masses felt. Normal bowel sounds heard. Central nervous system: Alert and oriented. No focal neurological deficits. Extremities: No C/C/E, +pedal pulses Skin: No rashes, no petechiae, no open wound. Psychiatry: Judgement and insight appear normal. Mood & affect appropriate.     Data Reviewed: I have personally reviewed following labs and imaging studies  CBC: Recent Labs  Lab 07/23/18 0956 07/24/18 0005 07/24/18 1051 07/24/18 1353  WBC 11.4*  --  15.4*  --   NEUTROABS 10.2*  --   --   --   HGB 5.8* 5.8* 9.6* 10.8*  HCT 20.6* 19.8* 32.1* 36.8*  MCV 78.6*  --  85.1  --   PLT 217  --  124*  --    Basic Metabolic Panel: Recent Labs  Lab 07/23/18 0956 07/24/18 1051  NA 133* 137  K 4.7 4.2  CL 108 110  CO2 19* 20*  GLUCOSE 159* 135*  BUN 53* 71*  CREATININE 1.03 1.16  CALCIUM 7.0* 7.4*   GFR: Estimated Creatinine Clearance: 32.1 mL/min (by C-G formula based on SCr of 1.16 mg/dL).  Coagulation Profile: Recent Labs  Lab 07/23/18 0956  INR 1.26   Cardiac Enzymes: Recent Labs  Lab 07/23/18 0956  TROPONINI <0.03   Urine analysis:    Component Value Date/Time   COLORURINE STRAW (A) 07/23/2018 0951   APPEARANCEUR CLEAR 07/23/2018 0951   LABSPEC 1.016 07/23/2018 0951   PHURINE 5.0 07/23/2018 0951   GLUCOSEU NEGATIVE 07/23/2018 0951   HGBUR NEGATIVE 07/23/2018 0951   BILIRUBINUR NEGATIVE 07/23/2018 0951   KETONESUR 5 (A) 07/23/2018 0951   PROTEINUR NEGATIVE 07/23/2018 0951   UROBILINOGEN 0.2 09/09/2014 1914   NITRITE NEGATIVE 07/23/2018 0951   LEUKOCYTESUR NEGATIVE 07/23/2018 0951    Recent Results (from the past 240 hour(s))  MRSA PCR Screening     Status: None    Collection Time: 07/23/18  1:25 PM  Result Value Ref Range Status   MRSA by PCR NEGATIVE NEGATIVE Final    Comment:        The GeneXpert MRSA Assay (FDA approved for NASAL specimens only), is one component of a comprehensive MRSA colonization surveillance program. It is not intended to diagnose MRSA infection nor to guide or monitor treatment for MRSA infections. Performed at [facility], [address] [place], [place] [address]      Scheduled Meds: . mouth rinse  15 mL Mouth Rinse BID  . metoCLOPramide (REGLAN) injection  10 mg Intravenous Q6H  . olopatadine  1 drop Both Eyes BID  . ondansetron (ZOFRAN) IV  4 mg Intravenous Q6H  . pantoprazole  40 mg Oral BID  . sodium chloride flush  3 mL Intravenous Q12H   Continuous Infusions: . sodium chloride       LOS: 1 day    Time spent: 25 minutes   Barton Dubois, MD [facility] Pager [phone]  If 7PM-7AM, please contact night-coverage [email or web address] Password [facility] 07/27/2018, 12:40 PM

## 2018-07-28 DIAGNOSIS — I10 Essential (primary) hypertension: Secondary | ICD-10-CM

## 2018-07-28 DIAGNOSIS — E039 Hypothyroidism, unspecified: Secondary | ICD-10-CM

## 2018-07-28 DIAGNOSIS — K922 Gastrointestinal hemorrhage, unspecified: Secondary | ICD-10-CM

## 2018-07-28 MED ORDER — PANTOPRAZOLE SODIUM 40 MG PO TBEC: 40.0000 mg | DELAYED_RELEASE_TABLET | Freq: Two times a day (BID) | ORAL | Status: AC

## 2018-07-28 MED ORDER — LORAZEPAM 1 MG PO TABS: 1.0000 mg | ORAL_TABLET | ORAL | 0 refills | Status: AC | PRN

## 2018-07-28 MED ORDER — ACETAMINOPHEN 325 MG PO TABS: 650.0000 mg | ORAL_TABLET | Freq: Four times a day (QID) | ORAL | Status: AC | PRN

## 2018-07-28 MED ORDER — OLOPATADINE HCL 0.1 % OP SOLN: 1.0000 [drp] | Freq: Two times a day (BID) | OPHTHALMIC | Status: AC

## 2018-07-28 MED ORDER — HALOPERIDOL 0.5 MG PO TABS: 0.5000 mg | ORAL_TABLET | ORAL | Status: AC | PRN

## 2018-07-28 MED ORDER — MORPHINE SULFATE (CONCENTRATE) 10 MG /0.5 ML PO SOLN: 4.0000 mg | ORAL | 0 refills | Status: AC | PRN

## 2018-07-28 NOTE — Discharge Summary (Signed)
Physician Discharge Summary  Luis Guerra GGE:[id number] DOB: 17-Sep-1920 DOA: 07/26/2018  PCP: Pablo Lawrence, NP  Admit date: 07/26/2018 Discharge date: 07/28/2018  Time spent: 35 minutes  Recommendations for Outpatient Follow-up:  1. Full Comfort  Discharge Diagnoses:  Active Problems:   End of life care   Acute upper GI bleed   Hypothyroidism   Benign essential HTN   Discharge Condition: Stable, comfortable and in no acute distress.  Patient will be transferred to residential hospice home for further symptomatic management and end-of-life care.  Diet recommendation: Liquid diet/comfort feeding.   History of present illness:  84 y.o.malewith medical history ofabdominal aortic aneurysm, laryngeal cancer, coronary disease, gastric lymph-epithelioma, hypertension, hypothyroidism presenting with generalized weakness and shortness of breath. The patient had been in his usual state of health until the morning of 07/23/2018. The patient got up to go to the bathroom and washed up. He stated that he had much difficulty getting up out of bed and felt dizzy and has some shortness of breath. He subsequently contacted the administrator at [facility], the patient was subsequently brought to the hospital for further evaluation. The patient denied any chest pain or syncope. He stated he had one episode of nausea and vomiting with coffee-ground emesis. In addition, the patient has had some intermittent melena for this past week.Upon presentation, the patient was afebrile with soft blood pressures in the upper 80s. Hemoglobin was noted to be 5.8. WBC was 11.4. BMP showed sodium 133 but was otherwise unremarkable. EKG shows sinus rhythm without ST ST wave changes. Hemoccult was positive without any bright red blood.The patient was initially transfused 2 units PRBC. The patient continued to have coffee-ground emesis and melena. Repeat hemoglobin was 5.8. He was transfused an  additional 2 units.Repeat hemoglobin was 9.6 after 4 units of PRBC. The patient continued to have coffee-ground emesis despite Protonix and Zofran. GI was consulted. However, after discussion with the patient's family, they do not want any aggressive intervention or invasive procedures. They wanted the patient to transition to a focus of care on full comfort.  Hospital Course:  Acute blood loss anemia/melena/symptomatic anemia -no further transfusions or interventions. -Continue symptomatic management and comfort care. -continue protonix PO -no further blood draws. -continue zofran, phenergan and reglan for control of nausea.  Essential hypertension -Continue to focus on comfort care and symptomatic management only. -no further antihypertensive drugs to be given  Hypothyroidism -Synthroid discontinued -Plan is for full comfort care and symptomatic management only.  Aortic dissection -Currently plan is to pursued full comfort care and symptomatic management only. -VS stable -no pain  CAD  -last cath 2008 with occluded D2, has been medically managed  -he is not on ASA because of allergy.  -also he was previously on plavix; not currently on any antiplatelet agentsdue to ongoing GI bleed. -no chest pain,no shortness of breath.  Laryngeal cancer -Diagnosed approximately 2 years prior to this admission -Received radiation -Currently in remission -Plan is for comfort care and symptomatic management.  COPD -Presently stable on room air -no wheezing -continue PRN inahler/nebs  Goals of Care -DNR/DNI -Patient has been transitioned to GIP and is waiting by the hospice home. -Continue full comfort and symptomatic management.  Procedures:  See below for x-ray reports  Consultations:  Hospice   Palliative care  GI  Discharge Exam: Vitals:   07/27/18 1955 07/28/18 0542  BP:  (!) 108/49  Pulse:  62  Temp:  98.3 F (36.8 C)  SpO2: 99% 100%  General  exam: Alert, awake, following commands appropriately; patient is afebrile and Family reported stools to be more clear. Respiratory system: Clear to auscultation. Respiratory effort normal. Cardiovascular system:RRR. No murmurs, rubs, gallops. Gastrointestinal system: Abdomen is nondistended, soft and nontender. No organomegaly or masses felt. Normal bowel sounds heard. Central nervous system: Alert and oriented. No focal neurological deficits. Extremities: No C/C/E, +pedal pulses Skin: No rashes, no petechiae, no open wound. Psychiatry: Judgement and insight appear normal. Mood & affect appropriate.   Discharge Instructions   Discharge Instructions    Discharge instructions   Complete by:  As directed    Full comfort care No further hospitalization Comfort liquid diet     Allergies as of 07/28/2018      Reactions   Aspirin Swelling   Esomeprazole Magnesium Diarrhea   Niacin Swelling   Penicillins Swelling   Has patient had a PCN reaction causing immediate rash, facial/tongue/throat swelling, SOB or lightheadedness with hypotension: YES Has patient had a PCN reaction causing severe rash involving mucus membranes or skin necrosis: NO Has patient had a PCN reaction that required hospitalization: NO Has patient had a PCN reaction occurring within the last 10 years: NO If all of the above answers are "NO", then may proceed with Cephalosporin use.      Medication List    TAKE these medications   acetaminophen 325 MG tablet Commonly known as:  TYLENOL Take 2 tablets (650 mg total) by mouth every 6 (six) hours as needed for mild pain (or Fever >/= 101).   haloperidol 0.5 MG tablet Commonly known as:  HALDOL Take 1 tablet (0.5 mg total) by mouth every 4 (four) hours as needed for agitation (or delirium).   LORazepam 1 MG tablet Commonly known as:  ATIVAN Take 1 tablet (1 mg total) by mouth every 4 (four) hours as needed for anxiety.   morphine CONCENTRATE 10 mg / 0.5 ml  concentrated solution Take 0.2 mLs (4 mg total) by mouth every 2 (two) hours as needed for moderate pain, severe pain or shortness of breath.   olopatadine 0.1 % ophthalmic solution Commonly known as:  PATANOL Place 1 drop into both eyes 2 (two) times daily.   ondansetron 8 MG disintegrating tablet Commonly known as:  ZOFRAN-ODT Take 1 tablet (8 mg total) by mouth every 8 (eight) hours as needed for nausea or vomiting.   pantoprazole 40 MG tablet Commonly known as:  PROTONIX Take 1 tablet (40 mg total) by mouth 2 (two) times daily.      Allergies  Allergen Reactions  . Aspirin Swelling  . Esomeprazole Magnesium Diarrhea  . Niacin Swelling  . Penicillins Swelling    Has patient had a PCN reaction causing immediate rash, facial/tongue/throat swelling, SOB or lightheadedness with hypotension: YES Has patient had a PCN reaction causing severe rash involving mucus membranes or skin necrosis: NO Has patient had a PCN reaction that required hospitalization: NO Has patient had a PCN reaction occurring within the last 10 years: NO If all of the above answers are "NO", then may proceed with Cephalosporin use.     The results of significant diagnostics from this hospitalization (including imaging, microbiology, ancillary and laboratory) are listed below for reference.    Significant Diagnostic Studies: Dg Chest Portable 1 View  Result Date: 07/23/2018 CLINICAL DATA:  Shortness of breath and dizziness beginning 1 hour ago, history of stomach cancer, COPD, coronary artery disease, hiatal hernia EXAM: PORTABLE CHEST 1 VIEW COMPARISON:  Portable exam 1008 hours compared  to 03/31/2018 FINDINGS: Upper normal heart size. Tortuous and question aneurysmal descending thoracic aorta with mild atherosclerotic calcification. Chronic peribronchial thickening with mild chronic accentuation of pulmonary markings. Lungs otherwise clear. No infiltrate, pleural effusion or pneumothorax. Bones demineralized.  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 Bronchitic changes with chronic accentuation of pulmonary markings. No acute abnormalities. Electronically Signed   By: Lavonia Dana M.D.   On: 07/23/2018 10:34    Microbiology: Recent Results (from the past 240 hour(s))  MRSA PCR Screening     Status: None   Collection Time: 07/23/18  1:25 PM  Result Value Ref Range Status   MRSA by PCR NEGATIVE NEGATIVE Final    Comment:        The GeneXpert MRSA Assay (FDA approved for NASAL specimens only), is one component of a comprehensive MRSA colonization surveillance program. It is not intended to diagnose MRSA infection nor to guide or monitor treatment for MRSA infections. Performed at [facility], [address], [place], [place] [address]      Labs: Basic Metabolic Panel: Recent Labs  Lab 07/23/18 0956 07/24/18 1051  NA 133* 137  K 4.7 4.2  CL 108 110  CO2 19* 20*  GLUCOSE 159* 135*  BUN 53* 71*  CREATININE 1.03 1.16  CALCIUM 7.0* 7.4*   CBC: Recent Labs  Lab 07/23/18 0956 07/24/18 0005 07/24/18 1051 07/24/18 1353  WBC 11.4*  --  15.4*  --   NEUTROABS 10.2*  --   --   --   HGB 5.8* 5.8* 9.6* 10.8*  HCT 20.6* 19.8* 32.1* 36.8*  MCV 78.6*  --  85.1  --   PLT 217  --  124*  --    Cardiac Enzymes: Recent Labs  Lab 07/23/18 0956  TROPONINI <0.03    Signed:  Barton Dubois MD.  [facility] 07/28/2018, 11:04 AM

## 2018-07-28 NOTE — Plan of Care (Signed)
Patient being sent to Hospice via EMS. Spoke with Butch Penny from Hospice and gave report. Family at bedside and made aware.

## 2018-07-28 NOTE — Clinical Social Work Note (Signed)
Discharge summary faxed to facility and transport arranged.   LCSW met with multiple family members at bedside and advised of EMS transport arrangement.  LCSW signing off.    Dellis Voght, Clydene Pugh, LCSW

## 2018-08-27 IMAGING — DX DG CHEST 2V
2 series · 2 of 2 positions shown · non-contrast
Comparison: 11/07/2016

CLINICAL DATA: Cough for couple weeks, lower LEFT side chest
soreness, history abdominal aortic aneurysm, gastric cancer, COPD,
former smoker

EXAM:
CHEST - 2 VIEW

[chest lat]
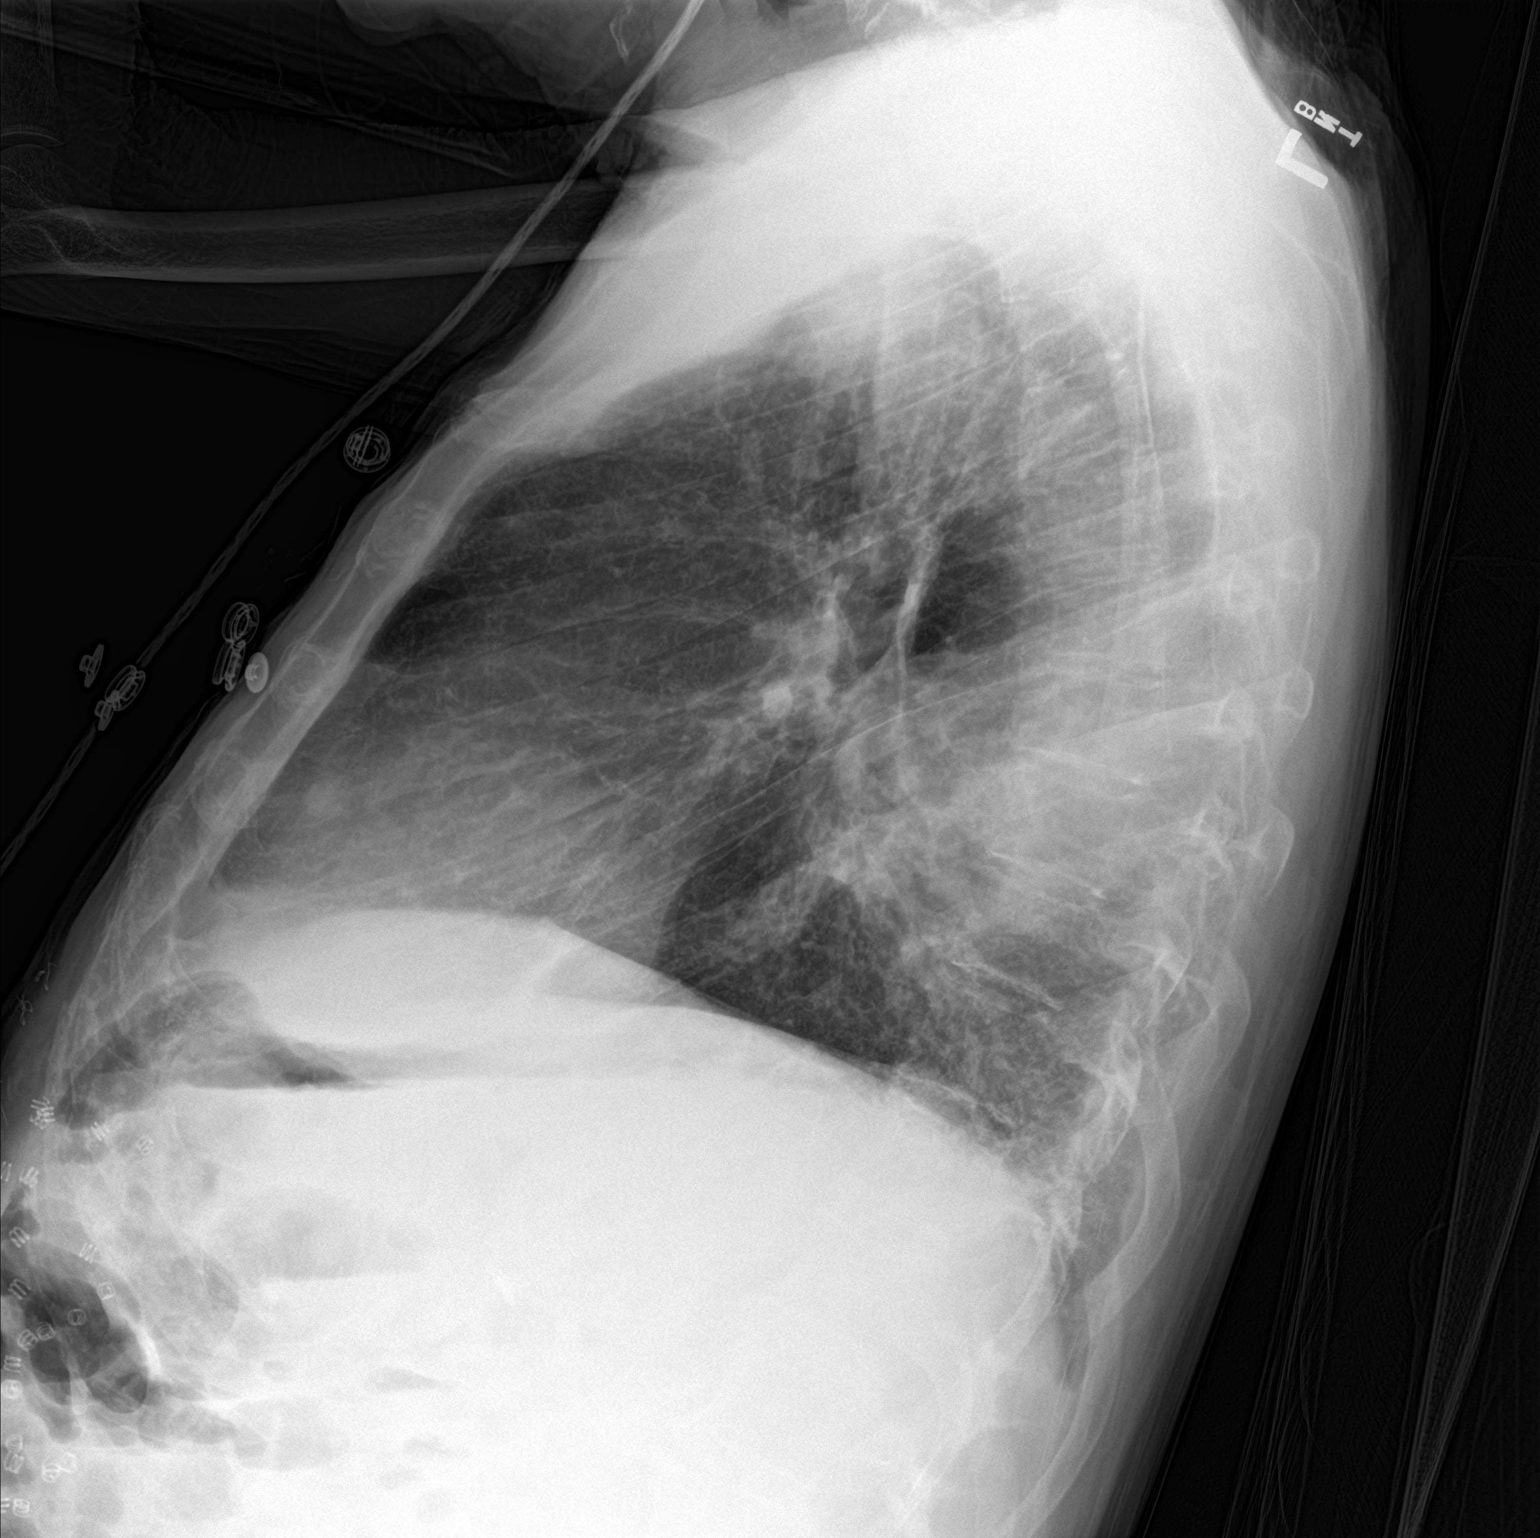

[chest ap]
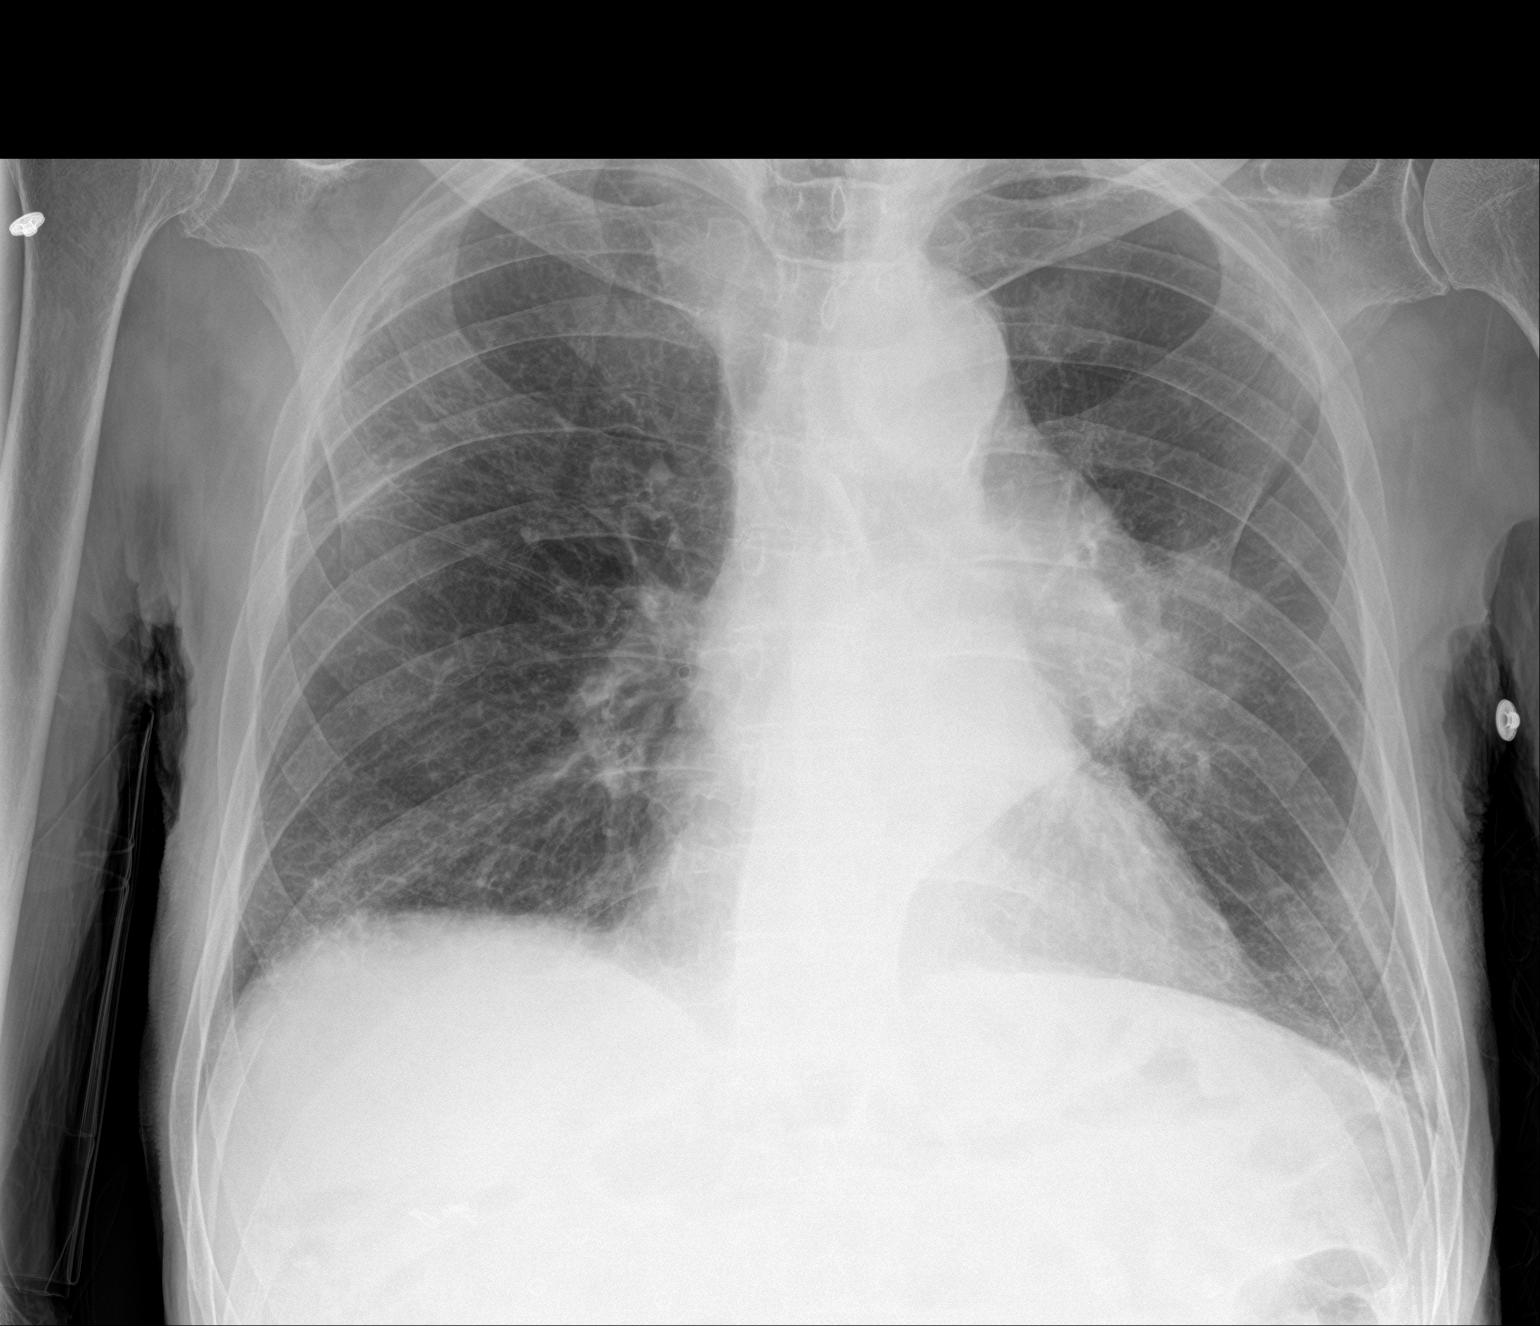

[2 of 2 positions shown; findings below may reference images not displayed]

FINDINGS: Normal heart size and pulmonary vascularity.

Markedly tortuous thoracic aorta with atherosclerotic calcification.

Suspect aneurysmal dilatation at the aortic arch.

Opacity identified identified in superior segment LEFT lower lobe
most consistent with pneumonia.

Minimal bibasilar interstitial prominence slightly increased since
previous exam.

RIGHT upper lobe scarring and central peribronchial thickening in
seen.

Bones demineralized.
IMPRESSION: Atherosclerotic calcifications of a markedly tortuous and suspect
aneurysmal thoracic aorta.

Opacity in superior segment LEFT lower lobe most consistent with
pneumonia.

Followup PA and lateral chest X-ray is recommended in 3-4 weeks
following trial of antibiotic therapy to ensure resolution and
exclude underlying malignancy.

## 2018-08-30 DEATH — deceased
